# Patient Record
Sex: Female | Born: 1945 | Race: Black or African American | Hispanic: No | Marital: Single | Smoking: Never smoker
Health system: Southern US, Community
[De-identification: ages and names within clinical notes are randomized; demographics above are authoritative.]

## PROBLEM LIST (undated history)

## (undated) DIAGNOSIS — I10 Essential (primary) hypertension: Secondary | ICD-10-CM

## (undated) DIAGNOSIS — I219 Acute myocardial infarction, unspecified: Secondary | ICD-10-CM

## (undated) DIAGNOSIS — S7291XA Unspecified fracture of right femur, initial encounter for closed fracture: Secondary | ICD-10-CM

## (undated) DIAGNOSIS — D518 Other vitamin B12 deficiency anemias: Secondary | ICD-10-CM

## (undated) DIAGNOSIS — M1990 Unspecified osteoarthritis, unspecified site: Secondary | ICD-10-CM

## (undated) DIAGNOSIS — I82419 Acute embolism and thrombosis of unspecified femoral vein: Secondary | ICD-10-CM

## (undated) DIAGNOSIS — I4891 Unspecified atrial fibrillation: Secondary | ICD-10-CM

## (undated) DIAGNOSIS — R570 Cardiogenic shock: Secondary | ICD-10-CM

## (undated) DIAGNOSIS — J690 Pneumonitis due to inhalation of food and vomit: Secondary | ICD-10-CM

## (undated) DIAGNOSIS — I472 Ventricular tachycardia: Secondary | ICD-10-CM

## (undated) DIAGNOSIS — J9690 Respiratory failure, unspecified, unspecified whether with hypoxia or hypercapnia: Secondary | ICD-10-CM

## (undated) HISTORY — PX: ABDOMINAL HYSTERECTOMY: SHX81

---

## 2003-10-31 ENCOUNTER — Ambulatory Visit (HOSPITAL_COMMUNITY): Admission: RE | Admit: 2003-10-31 | Discharge: 2003-10-31 | Payer: Self-pay | Admitting: Internal Medicine

## 2008-10-16 ENCOUNTER — Ambulatory Visit (HOSPITAL_COMMUNITY): Admission: RE | Admit: 2008-10-16 | Discharge: 2008-10-16 | Payer: Self-pay | Admitting: Family Medicine

## 2010-04-08 ENCOUNTER — Ambulatory Visit: Payer: Self-pay | Admitting: Family Medicine

## 2010-05-02 ENCOUNTER — Other Ambulatory Visit: Payer: Self-pay | Admitting: Obstetrics and Gynecology

## 2010-05-02 ENCOUNTER — Encounter (HOSPITAL_COMMUNITY): Payer: Medicare Other | Attending: Obstetrics and Gynecology

## 2010-05-02 DIAGNOSIS — Z01810 Encounter for preprocedural cardiovascular examination: Secondary | ICD-10-CM | POA: Insufficient documentation

## 2010-05-02 DIAGNOSIS — N814 Uterovaginal prolapse, unspecified: Secondary | ICD-10-CM | POA: Insufficient documentation

## 2010-05-02 DIAGNOSIS — Z01812 Encounter for preprocedural laboratory examination: Secondary | ICD-10-CM | POA: Insufficient documentation

## 2010-05-02 DIAGNOSIS — Z01818 Encounter for other preprocedural examination: Secondary | ICD-10-CM | POA: Insufficient documentation

## 2010-05-02 LAB — CBC
MCH: 28.7 pg (ref 26.0–34.0)
Platelets: 344 10*3/uL (ref 150–400)
RBC: 4.56 MIL/uL (ref 3.87–5.11)
RDW: 13.7 % (ref 11.5–15.5)

## 2010-05-02 LAB — COMPREHENSIVE METABOLIC PANEL
AST: 43 U/L — ABNORMAL HIGH (ref 0–37)
Albumin: 3.2 g/dL — ABNORMAL LOW (ref 3.5–5.2)
Calcium: 9.4 mg/dL (ref 8.4–10.5)
Chloride: 96 mEq/L (ref 96–112)
Creatinine, Ser: 0.66 mg/dL (ref 0.4–1.2)
GFR calc Af Amer: >60 mL/min (ref >60)
Total Bilirubin: 0.8 mg/dL (ref 0.3–1.2)
Total Protein: 5.2 g/dL — ABNORMAL LOW (ref 6.0–8.3)

## 2010-05-07 ENCOUNTER — Other Ambulatory Visit: Payer: Self-pay | Admitting: Obstetrics and Gynecology

## 2010-05-07 ENCOUNTER — Other Ambulatory Visit (HOSPITAL_COMMUNITY)
Admission: RE | Admit: 2010-05-07 | Discharge: 2010-05-07 | Disposition: A | Discharge status: Home / Self Care | Payer: Medicare Other | Source: Ambulatory Visit | Attending: Obstetrics and Gynecology | Admitting: Obstetrics and Gynecology

## 2010-05-07 ENCOUNTER — Ambulatory Visit (HOSPITAL_COMMUNITY)
Admission: RE | Admit: 2010-05-07 | Discharge: 2010-05-07 | Disposition: A | Discharge status: Home / Self Care | Payer: Medicare Other | Source: Ambulatory Visit | Attending: Obstetrics and Gynecology | Admitting: Obstetrics and Gynecology

## 2010-05-07 DIAGNOSIS — Z124 Encounter for screening for malignant neoplasm of cervix: Secondary | ICD-10-CM | POA: Insufficient documentation

## 2010-05-07 DIAGNOSIS — Z113 Encounter for screening for infections with a predominantly sexual mode of transmission: Secondary | ICD-10-CM | POA: Insufficient documentation

## 2010-06-11 ENCOUNTER — Other Ambulatory Visit: Payer: Self-pay | Admitting: Obstetrics and Gynecology

## 2010-06-14 NOTE — Op Note (Signed)
[name], [name]              ACCOUNT NO.:  [id number]   MEDICAL RECORD NO.:  [id number]          PATIENT TYPE:  AMB   LOCATION:  DAY                           FACILITY:  [facility]   PHYSICIAN:  R. Roetta Sessions, M.D. DATE OF BIRTH:  02/17/1945   DATE OF PROCEDURE:  10/31/2003  DATE OF DISCHARGE:                                 OPERATIVE REPORT   PROCEDURE:  Screening colonoscopy.   INDICATIONS:  The patient is a 65 year old lady sent over by Dr. Betha Loa down  in [facility] for high risk screening colonoscopy.  She is devoid of any GI  symptoms.  She had a colonoscopy several years ago.  Family history is  significant in that her sister has a history of colorectal cancer.  Colonoscopy is now being done.  This procedure has been discussed with the  patient at length.  The potential risks, benefits and alternatives have been  reviewed and questions answered.   DESCRIPTION OF PROCEDURE:  Oxygen saturation, blood pressure, pulse and  respiration were monitored throughout the entire procedure.  Conscious  sedation with Versed 2 mg IV and Demerol 50 mg IV in divided doses.  The  instrument was the Olympus video chip system.   FINDINGS:  Digital rectal exam no abnormalities.   ENDOSCOPIC FINDINGS:  Prep was good.   Rectum:  Examination of the rectal mucosa including retroflexion in the anal  verge revealed no abnormalities.   Colon:  Colonic mucosa was surveyed from the rectosigmoid junction through  the left, transverse,  right colon to the area of the appendiceal orifice,  ileocecal valve and cecum. These structures were well seen and photographed  for the record.  From this level the scope was slowly withdrawn.  All  previously mentioned mucosal surfaces were again seen.  The patient had a  few tiny sigmoid diverticula and the colonic mucosa appeared normal.  The  patient tolerated the procedure well and was reactive after endoscopy.   IMPRESSION:  1.  Normal rectum.  2.  A few  tiny left-sided diverticula.  3.  The remainder of the colonic mucosa appeared normal.   RECOMMENDATIONS:  Diverticulosis literature provided [name].  She  should return in five years for a repeat high-risk screening colonoscopy.     Otelia Sergeant   RMR/MEDQ  D:  10/31/2003  T:  10/31/2003  Job:  [id number]   cc:   Dr. Osvaldo Human [facility] near [place]

## 2010-07-11 ENCOUNTER — Other Ambulatory Visit: Payer: Self-pay | Admitting: Obstetrics and Gynecology

## 2010-07-11 ENCOUNTER — Other Ambulatory Visit: Payer: Self-pay | Admitting: Infectious Diseases

## 2010-07-11 ENCOUNTER — Encounter (HOSPITAL_COMMUNITY): Payer: Medicare Other

## 2010-07-11 LAB — CBC
HCT: 39.8 % (ref 36.0–46.0)
Hemoglobin: 14 g/dL (ref 12.0–15.0)
MCV: 83.8 fL (ref 78.0–100.0)
RBC: 4.75 MIL/uL (ref 3.87–5.11)
WBC: 6.8 10*3/uL (ref 4.0–10.5)

## 2010-07-11 LAB — BASIC METABOLIC PANEL
BUN: 9 mg/dL (ref 6–23)
CO2: 28 mEq/L (ref 19–32)
Chloride: 97 mEq/L (ref 96–112)
Creatinine, Ser: 0.62 mg/dL (ref 0.4–1.2)
Potassium: 4 mEq/L (ref 3.5–5.1)

## 2010-07-12 NOTE — H&P (Signed)
NAMEISEL, SKUFCA NO.:  [id number]  MEDICAL RECORD NO.:  [id number]  LOCATION:                                 FACILITY:  PHYSICIAN:  Tilda Burrow, M.D. DATE OF BIRTH:  05-26-1945  DATE OF ADMISSION: DATE OF DISCHARGE:  LH                             HISTORY & PHYSICAL   Date of admission July 16, 2010 through [facility]  ADMISSION DIAGNOSES:  Second-degree uterine descensus, cystocele, small rectocele, cervical dysplasia, low-grade mild to moderate dysplasia on biopsy.  HISTORY OF PRESENT ILLNESS:  This 65 year old female, postmenopausal, has been seen in our office for evaluation of symptomatic uterine descensus and cystocele.  She was referred to our office at the courtesy of Dr. Darreld Mclean of [facility], [place], [place].  She has been evaluated with exam revealing a small tiny atrophic uterus with descent to the point that it protrudes from the introitus with prolonged activity or lifting.  She has accompanying cystocele.  The posterior wall is moderately well supported and she has only a small rectocele, was not giving huge problems.  We nonetheless correct as a portion of the procedure.  REVIEW OF SYSTEMS:  Interestingly, the patient has had a retained IUD for 20+ years within the uterus.  Ultrasound has shown that there is no adjacent tissues and the endometrium is thin around the IUD.  Pap smear was performed, which showed cervical dysplasia, suspected low grade. Colposcopy revealed an extensive area of dysplasia on the posterior lip and larger area on the anterior lip of the cervix.  Biopsies were performed which showed mild-to-moderate dysplasia.  No invasive disease is identified.  PAST MEDICAL HISTORY:  Notable for hypertension, hypercholesterolemia, and gastritis.  SURGICAL HISTORY:  Negative.  ALLERGIES:  None.  MEDICATIONS: 1. Atenolol 50 mg twice daily. 2. HCTZ 25  mg p.o. daily. 3. Norvasc 10 mg p.o. daily. 4. Calcium and vitamin D taken daily. 5. Lipitor 20 mg p.o. daily.  PAST SURGICAL HISTORY:  Negative.  FAMILY HISTORY:  Notable that the patient is single, has one child.  She is a nonsmoker, nondrinker, has never done so.  PHYSICAL EXAMINATION:  GENERAL:  Shows a thin, petite female, weight 108.8, blood pressure 120/80, pulse 70s.  Urinalysis negative. HEENT:  Normocephalic, atraumatic.  Pupils are equal and reactive. NECK:  Supple.  Normal thyroid. CHEST:  Clear to auscultation. HEART:  Shows a regular rate and rhythm without murmurs. ABDOMEN:  Soft without masses. EXTERNAL GENITALIA:  Shows a normal perineal support, but obvious cervix and bladder protruding with Valsalva maneuver.  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 Adnexa was without masses.  Rectal support shows a small posterior rectocele beneath a well-supported anal sphincter with good muscle tone. EXTREMITIES:  Without cyanosis, clubbing, or edema.  The patient rolls her stockings and has been advised to discontinue this technique. Peripheral pulses are good.  IMPRESSION:  Secondary uterine prolapse, cystocele, small rectocele, and also other diagnoses include continuing the diagnoses cervical dysplasia, hypertension, hypercholesterolemia.  PLAN:  Vaginal hysterectomy with removal of tubes and ovaries, anterior and posterior repair, July 16, 2010.  Tilda Burrow, M.D.     JVF/MEDQ  D:  07/11/2010  T:  07/12/2010  Job:  [id number]  cc:   [facility]  Darreld Mclean, MD [facility] [address] [place], [place] [address] Fax:  [phone]  Electronically Signed by Christin Bach M.D. on 07/12/2010 07:37:41 PM

## 2010-07-16 ENCOUNTER — Ambulatory Visit (HOSPITAL_COMMUNITY)
Admission: RE | Admit: 2010-07-16 | Discharge: 2010-07-17 | Disposition: A | Discharge status: Home / Self Care | Payer: Medicare Other | Source: Ambulatory Visit | Attending: Obstetrics and Gynecology | Admitting: Obstetrics and Gynecology

## 2010-07-16 ENCOUNTER — Other Ambulatory Visit: Payer: Self-pay | Admitting: Obstetrics and Gynecology

## 2010-07-16 DIAGNOSIS — N870 Mild cervical dysplasia: Secondary | ICD-10-CM | POA: Insufficient documentation

## 2010-07-16 DIAGNOSIS — Z01812 Encounter for preprocedural laboratory examination: Secondary | ICD-10-CM | POA: Insufficient documentation

## 2010-07-16 DIAGNOSIS — N814 Uterovaginal prolapse, unspecified: Secondary | ICD-10-CM | POA: Insufficient documentation

## 2010-07-16 DIAGNOSIS — I10 Essential (primary) hypertension: Secondary | ICD-10-CM | POA: Insufficient documentation

## 2010-07-16 DIAGNOSIS — E7800 Pure hypercholesterolemia, unspecified: Secondary | ICD-10-CM | POA: Insufficient documentation

## 2010-07-16 DIAGNOSIS — Z23 Encounter for immunization: Secondary | ICD-10-CM | POA: Insufficient documentation

## 2010-07-16 DIAGNOSIS — Z79899 Other long term (current) drug therapy: Secondary | ICD-10-CM | POA: Insufficient documentation

## 2010-07-17 LAB — BASIC METABOLIC PANEL
BUN: 6 mg/dL (ref 6–23)
CO2: 28 mEq/L (ref 19–32)
GFR calc non Af Amer: >60 mL/min (ref >60)
Glucose, Bld: 121 mg/dL — ABNORMAL HIGH (ref 70–99)
Potassium: 3.4 mEq/L — ABNORMAL LOW (ref 3.5–5.1)
Sodium: 133 mEq/L — ABNORMAL LOW (ref 135–145)

## 2010-07-17 LAB — CBC
HCT: 34.6 % — ABNORMAL LOW (ref 36.0–46.0)
Hemoglobin: 11.9 g/dL — ABNORMAL LOW (ref 12.0–15.0)
MCH: 29 pg (ref 26.0–34.0)
MCHC: 34.4 g/dL (ref 30.0–36.0)
RBC: 4.11 MIL/uL (ref 3.87–5.11)

## 2010-07-17 LAB — DIFFERENTIAL
Basophils Absolute: 0 10*3/uL (ref 0.0–0.1)
Lymphocytes Relative: 17 % (ref 12–46)
Monocytes Absolute: 1 10*3/uL (ref 0.1–1.0)
Monocytes Relative: 11 % (ref 3–12)
Neutro Abs: 6.3 10*3/uL (ref 1.7–7.7)
Neutrophils Relative %: 71 % (ref 43–77)

## 2010-07-28 NOTE — Op Note (Signed)
[name], [name] NO.:  [id number]  MEDICAL RECORD NO.:  [id number]  LOCATION:  A316                          FACILITY:  [facility]  PHYSICIAN:  Tilda Burrow, M.D. DATE OF BIRTH:  Jul 23, 1945  DATE OF PROCEDURE:  07/16/2010 DATE OF DISCHARGE:                              OPERATIVE REPORT   PREOPERATIVE DIAGNOSES: 1. Cystocele. 2. Rectocele. 3. Uterine descensus second-degree. 4. Cervical dysplasia, grade 1.  POSTOPERATIVE DIAGNOSES: 1. Cystocele. 2. Rectocele. 3. Uterine descensus second-degree. 4. Cervical dysplasia, grade 1.  PROCEDURES: 1. Vaginal hysterectomy. 2. Bilateral salpingo-oophorectomy. 3. Anterior and posterior repair.  SURGEON:  Tilda Burrow, MD  ASSISTANT:  Earl Many, RN  ANESTHESIA:  Spinal.  COMPLICATIONS:  None.  FINDINGS:  Small uterus with elongated cervix protruding past the introitus with Valsalva.  Small cystocele.  PROCEDURE DETAILS:  The patient was taken to the operating room, prepped and draped for vaginal procedure after spinal anesthesia introduced. Time-out was conducted and procedure confirmed by involved parties and the weighted short 3-inch speculum placed in the vagina.  The posterior colpotomy incision was performed identifying the posterior cul-de-sac. The anterior cervix was circumscribed with Bovie and then with the surgeon's index finger placed behind the tiny uterus we were able to identify the vesicouterine reflection of peritoneum and sharply dissected the vesicouterine space and into the peritoneum anteriorly as well.  The uterosacral ligaments were clamped, cut, and suture ligated on either side and tagged for future identification.  Lower cardinal ligament was clamped, cut, and ligated on each side using the Zeppelin clamp and 0-chromic suture.  The upper cardinal ligament and broad ligament were then serially clamped, cut, and suture ligated using small bites of the Zeppelin clamp, Mayo  scissors transection and 0-chromic suture ligature with good hemostasis.  Particular care was taken to stay very close to the uterus and lower uterine segment during this portion of the case.  Upon reaching the level of the fallopian tubes, the pedicle was crossclamped including the utero-ovarian ligament and fallopian tube all- in-one body.  These were transected and ligated and tagged for future identification.  The uterus was removed.  On palpation I could identify that the IUD was still inside the uterus.  The fallopian tube and ovary were then identified and while the left tube was placed on traction, we were able to identify the infundibulopelvic ligaments and while staying close to the tube and ovary the pedicle could be crossclamped and doubly ligated.  The patient experienced some bradycardia during this portion of the case but responded upon cessation of traction on the left tube.  The right side was treated similarly and tolerated well by the patient.  Specimens were and taken passed off and pedicles were intact.  They were hemostatic as well.  The patient then had 0-Prolene attachment of the posterior cul-de-sac to the medial aspects of the uterosacral ligament on each side which was sutured in order to improve vaginal cuff support.  The anterior peritoneum was then attached to the posterior cul-de-sac just behind the Prolene sutures previously placed.  Foley catheter was inserted draining 200 mL of clear urine.  Anterior repair followed.  The anterior repair consisted  of splitting the vaginal epithelium beneath the upper aspects of the bladder, peeling the epithelium off laterally and trimmings the redundant tissue just anterior to the cervix.  Given the previous wide area of cervical dysplasia, this was considered burden.  The remaining anterior epithelium was split in the midline, dissected free from underlying cystocele, and a series of horizontal mattress sutures  placed on the underside of the epithelium to improve the anterior support.  The remaining cuff edges were attached to the vaginal cuff, closing the cuff and providing improved anterior support.  Hemostasis was good.  Posterior repair followed.  This consisted of grabbing the posterior perineal body at 5 and 7 o'clock, splitting the epithelium transversely and dissecting into the epithelium with a distance of about 6 cm and dissecting laterally.  Marcaine 20 mL with epinephrine had been placed in this area prior to this dissection.  We were able to identify some good strong supportive tissues from the levator plate on either side and this could be pulled into the midline with a series of horizontal mattress sutures which resulted in improved posterior perineal support and good vaginal diameters left.  The posterior perineal body was pulled into closer approximation by a series of horizontal mattress sutures.  Vaginal epithelium trimmed and then the posterior perineum pulled back together in the longitudinal axis.  The perineal body support was dramatically improved.  Vaginal packing was dry.  Betadine- soaked vaginal gauze was placed.  Sponge and needle counts were correct. The patient allowed to go to recovery room in good condition.     Tilda Burrow, M.D.     JVF/MEDQ  D:  07/16/2010  T:  07/17/2010  Job:  [id number]  cc:   [facility]  Electronically Signed by Christin Bach M.D. on 07/28/2010 10:26:03 PM

## 2010-12-24 ENCOUNTER — Encounter: Payer: Self-pay | Admitting: Emergency Medicine

## 2010-12-24 ENCOUNTER — Inpatient Hospital Stay (HOSPITAL_COMMUNITY)
Admission: EM | Admit: 2010-12-24 | Discharge: 2010-12-30 | DRG: 300 | Disposition: A | Discharge status: Skilled Nursing Facility | Payer: Medicare Other | Attending: Internal Medicine | Admitting: Internal Medicine

## 2010-12-24 DIAGNOSIS — E876 Hypokalemia: Secondary | ICD-10-CM | POA: Diagnosis present

## 2010-12-24 DIAGNOSIS — R52 Pain, unspecified: Secondary | ICD-10-CM | POA: Diagnosis present

## 2010-12-24 DIAGNOSIS — I824Y9 Acute embolism and thrombosis of unspecified deep veins of unspecified proximal lower extremity: Principal | ICD-10-CM | POA: Diagnosis present

## 2010-12-24 DIAGNOSIS — D518 Other vitamin B12 deficiency anemias: Secondary | ICD-10-CM

## 2010-12-24 DIAGNOSIS — I10 Essential (primary) hypertension: Secondary | ICD-10-CM

## 2010-12-24 DIAGNOSIS — D649 Anemia, unspecified: Secondary | ICD-10-CM

## 2010-12-24 DIAGNOSIS — R760 Raised antibody titer: Secondary | ICD-10-CM

## 2010-12-24 DIAGNOSIS — R894 Abnormal immunological findings in specimens from other organs, systems and tissues: Secondary | ICD-10-CM | POA: Diagnosis present

## 2010-12-24 DIAGNOSIS — I82419 Acute embolism and thrombosis of unspecified femoral vein: Secondary | ICD-10-CM | POA: Diagnosis present

## 2010-12-24 DIAGNOSIS — E871 Hypo-osmolality and hyponatremia: Secondary | ICD-10-CM

## 2010-12-24 DIAGNOSIS — D509 Iron deficiency anemia, unspecified: Secondary | ICD-10-CM | POA: Diagnosis present

## 2010-12-24 DIAGNOSIS — R410 Disorientation, unspecified: Secondary | ICD-10-CM | POA: Diagnosis not present

## 2010-12-24 DIAGNOSIS — E538 Deficiency of other specified B group vitamins: Secondary | ICD-10-CM | POA: Diagnosis present

## 2010-12-24 DIAGNOSIS — M7989 Other specified soft tissue disorders: Secondary | ICD-10-CM

## 2010-12-24 HISTORY — DX: Acute embolism and thrombosis of unspecified femoral vein: I82.419

## 2010-12-24 HISTORY — DX: Other vitamin B12 deficiency anemias: D51.8

## 2010-12-24 HISTORY — DX: Essential (primary) hypertension: I10

## 2010-12-24 LAB — COMPREHENSIVE METABOLIC PANEL
Albumin: 3.1 g/dL — ABNORMAL LOW (ref 3.5–5.2)
Alkaline Phosphatase: 52 U/L (ref 39–117)
BUN: 8 mg/dL (ref 6–23)
Calcium: 9.2 mg/dL (ref 8.4–10.5)
Creatinine, Ser: 0.55 mg/dL (ref 0.50–1.10)
GFR calc Af Amer: >90 mL/min (ref >90)
Glucose, Bld: 120 mg/dL — ABNORMAL HIGH (ref 70–99)
Potassium: 3.1 mEq/L — ABNORMAL LOW (ref 3.5–5.1)
Total Protein: 7.1 g/dL (ref 6.0–8.3)

## 2010-12-24 LAB — CBC
Hemoglobin: 12.5 g/dL (ref 12.0–15.0)
MCH: 27.7 pg (ref 26.0–34.0)
MCHC: 34.1 g/dL (ref 30.0–36.0)
MCV: 81.4 fL (ref 78.0–100.0)
RBC: 4.51 MIL/uL (ref 3.87–5.11)

## 2010-12-24 LAB — D-DIMER, QUANTITATIVE: D-Dimer, Quant: 3.16 ug/mL-FEU — ABNORMAL HIGH (ref 0.00–0.48)

## 2010-12-24 LAB — DIFFERENTIAL
Basophils Relative: 0 % (ref 0–1)
Eosinophils Absolute: 0.1 10*3/uL (ref 0.0–0.7)
Eosinophils Relative: 1 % (ref 0–5)
Lymphs Abs: 1 10*3/uL (ref 0.7–4.0)
Monocytes Absolute: 0.6 10*3/uL (ref 0.1–1.0)
Monocytes Relative: 6 % (ref 3–12)
Neutrophils Relative %: 82 % — ABNORMAL HIGH (ref 43–77)

## 2010-12-24 LAB — PROTIME-INR
INR: 1.03 (ref 0.00–1.49)
Prothrombin Time: 13.7 seconds (ref 11.6–15.2)

## 2010-12-24 LAB — APTT: aPTT: 31 seconds (ref 24–37)

## 2010-12-24 MED ORDER — SODIUM CHLORIDE 0.9 % IJ SOLN
3.0000 mL | Freq: Two times a day (BID) | INTRAMUSCULAR | Status: DC
  Administered 2010-12-25 – 2010-12-30 (×11): 3 mL via INTRAVENOUS
  Filled 2010-12-24 (×12): qty 3

## 2010-12-24 MED ORDER — ENOXAPARIN SODIUM 80 MG/0.8ML ~~LOC~~ SOLN
45.0000 mg | Freq: Once | SUBCUTANEOUS | Status: AC
  Administered 2010-12-24: 45 mg via SUBCUTANEOUS
  Filled 2010-12-24: qty 0.8

## 2010-12-24 MED ORDER — SENNA 8.6 MG PO TABS: 2.0000 | ORAL_TABLET | Freq: Every day | ORAL | Status: DC | PRN

## 2010-12-24 MED ORDER — ENOXAPARIN SODIUM 30 MG/0.3ML ~~LOC~~ SOLN: 1.0000 mg/kg | Freq: Two times a day (BID) | SUBCUTANEOUS | Status: DC

## 2010-12-24 MED ORDER — ONDANSETRON HCL 4 MG/2ML IJ SOLN
4.0000 mg | Freq: Once | INTRAMUSCULAR | Status: AC
  Administered 2010-12-24: 4 mg via INTRAVENOUS
  Filled 2010-12-24: qty 2

## 2010-12-24 MED ORDER — POTASSIUM CHLORIDE CRYS ER 20 MEQ PO TBCR
20.0000 meq | EXTENDED_RELEASE_TABLET | Freq: Once | ORAL | Status: AC
  Administered 2010-12-25: 20 meq via ORAL
  Filled 2010-12-24: qty 1

## 2010-12-24 MED ORDER — HYDROCHLOROTHIAZIDE 25 MG PO TABS
25.0000 mg | ORAL_TABLET | ORAL | Status: DC
  Administered 2010-12-25 – 2010-12-26 (×2): 25 mg via ORAL
  Filled 2010-12-24 (×2): qty 1

## 2010-12-24 MED ORDER — ATENOLOL 25 MG PO TABS
50.0000 mg | ORAL_TABLET | Freq: Two times a day (BID) | ORAL | Status: DC
  Administered 2010-12-25 – 2010-12-26 (×4): 50 mg via ORAL
  Filled 2010-12-24 (×4): qty 2

## 2010-12-24 MED ORDER — HYDROMORPHONE HCL PF 1 MG/ML IJ SOLN
1.0000 mg | Freq: Once | INTRAMUSCULAR | Status: AC
  Administered 2010-12-24: 1 mg via INTRAVENOUS
  Filled 2010-12-24: qty 1

## 2010-12-24 MED ORDER — DOCUSATE SODIUM 100 MG PO CAPS
100.0000 mg | ORAL_CAPSULE | Freq: Two times a day (BID) | ORAL | Status: DC
  Administered 2010-12-25 – 2010-12-30 (×12): 100 mg via ORAL
  Filled 2010-12-24 (×12): qty 1

## 2010-12-24 MED ORDER — CALCIUM CARBONATE-VITAMIN D 500-200 MG-UNIT PO TABS
1.0000 | ORAL_TABLET | Freq: Two times a day (BID) | ORAL | Status: DC
  Administered 2010-12-25 – 2010-12-30 (×11): 1 via ORAL
  Filled 2010-12-24 (×11): qty 1

## 2010-12-24 MED ORDER — ONDANSETRON HCL 4 MG PO TABS: 4.0000 mg | ORAL_TABLET | Freq: Four times a day (QID) | ORAL | Status: DC | PRN

## 2010-12-24 MED ORDER — ACETAMINOPHEN 650 MG RE SUPP: 650.0000 mg | Freq: Four times a day (QID) | RECTAL | Status: DC | PRN

## 2010-12-24 MED ORDER — ALBUTEROL SULFATE (5 MG/ML) 0.5% IN NEBU: 2.5000 mg | INHALATION_SOLUTION | RESPIRATORY_TRACT | Status: DC | PRN

## 2010-12-24 MED ORDER — MORPHINE SULFATE 4 MG/ML IJ SOLN
2.0000 mg | INTRAMUSCULAR | Status: DC | PRN
  Administered 2010-12-25: 2 mg via INTRAVENOUS
  Filled 2010-12-24: qty 1

## 2010-12-24 MED ORDER — POTASSIUM CHLORIDE CRYS ER 20 MEQ PO TBCR
40.0000 meq | EXTENDED_RELEASE_TABLET | Freq: Once | ORAL | Status: AC
  Administered 2010-12-24: 40 meq via ORAL
  Filled 2010-12-24: qty 2

## 2010-12-24 MED ORDER — ONDANSETRON HCL 4 MG/2ML IJ SOLN: 4.0000 mg | Freq: Four times a day (QID) | INTRAMUSCULAR | Status: DC | PRN

## 2010-12-24 MED ORDER — OXYCODONE HCL 5 MG PO TABS
5.0000 mg | ORAL_TABLET | ORAL | Status: DC | PRN
  Administered 2010-12-25 – 2010-12-30 (×14): 5 mg via ORAL
  Filled 2010-12-24 (×14): qty 1

## 2010-12-24 MED ORDER — TRAZODONE HCL 50 MG PO TABS: 50.0000 mg | ORAL_TABLET | Freq: Every evening | ORAL | Status: DC | PRN

## 2010-12-24 MED ORDER — ACETAMINOPHEN 325 MG PO TABS
650.0000 mg | ORAL_TABLET | Freq: Four times a day (QID) | ORAL | Status: DC | PRN
  Administered 2010-12-27 – 2010-12-29 (×3): 650 mg via ORAL
  Filled 2010-12-24 (×3): qty 2

## 2010-12-24 NOTE — H&P (Signed)
[name] is an 65 y.o. female.    PCP: Leanna Sato, MD This physician is located in [place], [place].  Chief Complaint: Pain and swelling of the right leg  HPI: This is a 65 year old, African American female, with a past history of hypertension. She underwent abdominal hysterectomy in June of this year by Dr. Emelda Fear. She was in her usual state of health till a few weeks ago, when she started having pain in the right leg. She did not seek any attention. The pain got severe as the days went by. And, then on Sunday she started noticing swelling in that leg. The pain got very severe over the last couple days and, so, she decided to come in to the hospital. Denies any fever or chills. Denies any chest pain or shortness of breath. Denies any dizziness or lightheadedness. Denies any nausea, vomiting. Denies any dysuria. Denies any abdominal pain. Never had blood clots in the past.   Prior to Admission medications   Medication Sig Start Date End Date Taking? Authorizing Provider  atenolol (TENORMIN) 50 MG tablet Take 50 mg by mouth 2 (two) times daily.     Yes Historical Provider, MD  Calcium Carbonate-Vitamin D ([employer] CALCIUM 600 + D) 600-400 MG-UNIT per tablet Take 1 tablet by mouth 2 (two) times daily.     Yes Historical Provider, MD  hydrochlorothiazide (HYDRODIURIL) 25 MG tablet Take 25 mg by mouth every morning.     Yes Historical Provider, MD  traZODone (DESYREL) 50 MG tablet Take 50 mg by mouth at bedtime.     Yes Historical Provider, MD    Allergies: No Known Allergies  Past Medical History  Diagnosis Date  . Hypertension     Past Surgical History  Procedure Date  . Abdominal hysterectomy     06 /2012    Social History:  reports that she has never smoked. She has never used smokeless tobacco. She reports that she does not drink alcohol or use illicit drugs.  Family History: Patient says that there is no medical problems in the family.  Review of Systems - History  obtained from the patient and daughter General ROS: negative Psychological ROS: negative Ophthalmic ROS: negative ENT ROS: negative Allergy and Immunology ROS: negative Hematological and Lymphatic ROS: negative Endocrine ROS: negative Respiratory ROS: no cough, shortness of breath, or wheezing Cardiovascular ROS: no chest pain or dyspnea on exertion Gastrointestinal ROS: no abdominal pain, change in bowel habits, or black or bloody stools Genito-Urinary ROS: no dysuria, trouble voiding, or hematuria Musculoskeletal ROS: positive for - pain in leg - right Neurological ROS: negative Dermatological ROS: negative  Physical Examination Blood pressure 126/78, pulse 100, temperature 98.2 F (36.8 C), temperature source Oral, resp. rate 16, height 5' (1.524 m), weight 45.36 kg (100 lb), SpO2 98.00%.  General appearance: alert, cooperative, appears stated age and no distress Head: Normocephalic, without obvious abnormality, atraumatic Eyes: conjunctivae/corneas clear. PERRL, EOM's intact. Fundi benign. Throat: lips, mucosa, and tongue normal; teeth and gums normal Neck: no adenopathy, no carotid bruit, no JVD, supple, symmetrical, trachea midline and thyroid not enlarged, symmetric, no tenderness/mass/nodules Resp: clear to auscultation bilaterally Cardio: regular rate and rhythm, S1, S2 normal, no murmur, click, rub or gallop GI: soft, non-tender; bowel sounds normal; no masses,  no organomegaly Extremities: Swelling and tenderness right thigh and leg. calf tenderness is present. Pulses: 2+ and symmetric Skin: mild redness of right leg Lymph nodes: Cervical, supraclavicular, and axillary nodes normal. Neurologic: Alert and oriented X 3, normal  strength and tone. Normal symmetric reflexes. Normal coordination and gait  Results for orders placed during the hospital encounter of 12/24/10 (from the past 48 hour(s))  CBC     Status: Normal   Collection Time   12/24/10  6:05 PM      Component  Value Range Comment   WBC 9.3  4.0 - 10.5 (K/uL)    RBC 4.51  3.87 - 5.11 (MIL/uL)    Hemoglobin 12.5  12.0 - 15.0 (g/dL)    HCT 16.1  09.6 - 04.5 (%)    MCV 81.4  78.0 - 100.0 (fL)    MCH 27.7  26.0 - 34.0 (pg)    MCHC 34.1  30.0 - 36.0 (g/dL)    RDW 40.9  81.1 - 91.4 (%)    Platelets 227  150 - 400 (K/uL)   DIFFERENTIAL     Status: Abnormal   Collection Time   12/24/10  6:05 PM      Component Value Range Comment   Neutrophils Relative 82 (*) 43 - 77 (%)    Neutro Abs 7.6  1.7 - 7.7 (K/uL)    Lymphocytes Relative 11 (*) 12 - 46 (%)    Lymphs Abs 1.0  0.7 - 4.0 (K/uL)    Monocytes Relative 6  3 - 12 (%)    Monocytes Absolute 0.6  0.1 - 1.0 (K/uL)    Eosinophils Relative 1  0 - 5 (%)    Eosinophils Absolute 0.1  0.0 - 0.7 (K/uL)    Basophils Relative 0  0 - 1 (%)    Basophils Absolute 0.0  0.0 - 0.1 (K/uL)   COMPREHENSIVE METABOLIC PANEL     Status: Abnormal   Collection Time   12/24/10  6:05 PM      Component Value Range Comment   Sodium 139  135 - 145 (mEq/L)    Potassium 3.1 (*) 3.5 - 5.1 (mEq/L)    Chloride 100  96 - 112 (mEq/L)    CO2 26  19 - 32 (mEq/L)    Glucose, Bld 120 (*) 70 - 99 (mg/dL)    BUN 8  6 - 23 (mg/dL)    Creatinine, Ser 7.82  0.50 - 1.10 (mg/dL)    Calcium 9.2  8.4 - 10.5 (mg/dL)    Total Protein 7.1  6.0 - 8.3 (g/dL)    Albumin 3.1 (*) 3.5 - 5.2 (g/dL)    AST 37  0 - 37 (U/L)    ALT 15  0 - 35 (U/L)    Alkaline Phosphatase 52  39 - 117 (U/L)    Total Bilirubin 0.2 (*) 0.3 - 1.2 (mg/dL)    GFR calc non Af Amer >90  >90 (mL/min)    GFR calc Af Amer >90  >90 (mL/min)   PROTIME-INR     Status: Normal   Collection Time   12/24/10  6:05 PM      Component Value Range Comment   Prothrombin Time 13.7  11.6 - 15.2 (seconds)    INR 1.03  0.00 - 1.49    APTT     Status: Normal   Collection Time   12/24/10  6:05 PM      Component Value Range Comment   aPTT 31  24 - 37 (seconds)   D-DIMER, QUANTITATIVE     Status: Abnormal   Collection Time   12/24/10   6:05 PM      Component Value Range Comment   D-Dimer, Quant 3.16 (*) 0.00 -  0.48 (ug/mL-FEU)    No results found.   Assessment/Plan  Principal Problem:  *Leg swelling Active Problems:  HTN (hypertension)  Pain  Hypokalemia   #1 right leg swelling: This is most likely a deep venous thrombosis. She'll be given Lovenox. A venous Doppler will be obtained in the morning. Patient has significant pain and is unable to walk and since she lives alone, patient could not be discharged home. Depending on the diagnosis further evaluation may be required.  #2 history of hypertension: Continue with her anti hypertensive agents.  #3 hypokalemia: Replete her potassium.  Patient is a full code.  Further management decisions will depend on results of further testing and patient's response to treatment  [name] 12/24/2010, 10:58 PM

## 2010-12-24 NOTE — ED Provider Notes (Signed)
History  This chart was scribed for [employer]. Colon Branch, MD by Clarita Crane. The patient was seen in room APA11/APA11 and the patient's care was started at 5:10pm.  CSN: [id number] Arrival date & time: 12/24/2010  4:52 PM  Chief Complaint  Patient presents with  . Leg Pain  . Leg Swelling    First MD Initiated Contact with Patient 12/24/10 1654    (Consider location/radiation/quality/duration/timing/severity/associated sxs/prior treatment) HPI [name] is a 65 y.o. female who presents to the Emergency Department complaining of constant moderate to severe right leg pain. Pt states leg pain began 3 days ago when swelling was initially noted. Pt reports she continued to work with little pain to her right leg. Today, pt noted increased swelling in right thigh from the groin to knee and noted severe constant pain. Pt rates pain 8/10 currently. Pt reports she was unable to move today when going up the stairs because her leg "gave out," which is when she called EMS. Pt denies associated shortness of breath, chills, syncope, cough, hemoptysis, fall and fever. Pt denies hx of the same.    PCP Marvis Moeller MD in [place] Past Medical History  Diagnosis Date  . Hypertension     Past Surgical History  Procedure Date  . Abdominal hysterectomy     06/2010    History reviewed. No pertinent family history.  History  Substance Use Topics  . Smoking status: Never Smoker   . Smokeless tobacco: Never Used  . Alcohol Use: No    OB History    Grav Para Term Preterm Abortions TAB SAB Ect Mult Living   1 1 1       1       Review of Systems  Constitutional: Negative for fever and chills.  Respiratory: Negative for cough and shortness of breath.        Pt denies hemoptysis.  Neurological: Negative for syncope.       Pt denies fall.   All other systems reviewed and are negative.    Allergies  Review of patient's allergies indicates no known allergies.  Home Medications   Current  Outpatient Rx  Name Route Sig Dispense Refill  . ATENOLOL 50 MG PO TABS Oral Take 50 mg by mouth 2 (two) times daily.      Marland Kitchen CALCIUM CARBONATE-VITAMIN D 600-400 MG-UNIT PO TABS Oral Take 1 tablet by mouth 2 (two) times daily.      Marland Kitchen HYDROCHLOROTHIAZIDE 25 MG PO TABS Oral Take 25 mg by mouth every morning.      . TRAZODONE HCL 50 MG PO TABS Oral Take 50 mg by mouth at bedtime.        BP 145/82  Pulse 112  Temp(Src) 98.2 F (36.8 C) (Oral)  Resp 16  Ht 5' (1.524 m)  Wt 100 lb (45.36 kg)  BMI 19.53 kg/m2  SpO2 98%  Physical Exam  Constitutional: She is oriented to person, place, and time. She appears well-developed and well-nourished. No distress.  HENT:  Head: Normocephalic and atraumatic.  Eyes: Conjunctivae and EOM are normal. Pupils are equal, round, and reactive to light.  Neck: Normal range of motion. Neck supple.  Cardiovascular: Normal rate, regular rhythm and normal heart sounds.  Exam reveals no gallop and no friction rub.   No murmur heard. Pulmonary/Chest: Effort normal and breath sounds normal. No respiratory distress. She has no wheezes. She has no rales.  Abdominal: Soft. Bowel sounds are normal. There is no tenderness.  Hips are nl and pelvis is non-tender with applied pressure  Musculoskeletal: She exhibits edema.       Markedly swollen right thigh noted from groin to right knee. Popliteal pulse noted in right leg. Bilateral pulses noted (Dorsalis Pedis and Posterior Tibialis pulse).  Right thigh 22''  Right knee 16''  Lt thigh 18"  Lt knee 12"  Neurological: She is alert and oriented to person, place, and time.  Skin: Skin is warm and dry. No erythema.       Right thigh hot to the touch in comparison to left leg. No evidence of cellulitis.  Psychiatric: She has a normal mood and affect. Her behavior is normal.    ED Course  Procedures (including critical care time)  DIAGNOSTIC STUDIES: Oxygen Saturation is 98% on room air, normal by my  interpretation.    COORDINATION OF CARE:  6:21 PM: Reassessed pt. She denies nausea at this time and diarrhea episodes. Pt states pain has improved, but still experiencing some. In addition, notified pt of benign imaging results. Provided pt with some education of abdominal pain. Noted pt is still tachycardia at this time. Pt informed of intent to continue infuse IV fluids.  Results for orders placed during the hospital encounter of 12/24/10  CBC      Component Value Range   WBC 9.3  4.0 - 10.5 (K/uL)   RBC 4.51  3.87 - 5.11 (MIL/uL)   Hemoglobin 12.5  12.0 - 15.0 (g/dL)   HCT 19.1  47.8 - 29.5 (%)   MCV 81.4  78.0 - 100.0 (fL)   MCH 27.7  26.0 - 34.0 (pg)   MCHC 34.1  30.0 - 36.0 (g/dL)   RDW 62.1  30.8 - 65.7 (%)   Platelets 227  150 - 400 (K/uL)  DIFFERENTIAL      Component Value Range   Neutrophils Relative 82 (*) 43 - 77 (%)   Neutro Abs 7.6  1.7 - 7.7 (K/uL)   Lymphocytes Relative 11 (*) 12 - 46 (%)   Lymphs Abs 1.0  0.7 - 4.0 (K/uL)   Monocytes Relative 6  3 - 12 (%)   Monocytes Absolute 0.6  0.1 - 1.0 (K/uL)   Eosinophils Relative 1  0 - 5 (%)   Eosinophils Absolute 0.1  0.0 - 0.7 (K/uL)   Basophils Relative 0  0 - 1 (%)   Basophils Absolute 0.0  0.0 - 0.1 (K/uL)  COMPREHENSIVE METABOLIC PANEL      Component Value Range   Sodium 139  135 - 145 (mEq/L)   Potassium 3.1 (*) 3.5 - 5.1 (mEq/L)   Chloride 100  96 - 112 (mEq/L)   CO2 26  19 - 32 (mEq/L)   Glucose, Bld 120 (*) 70 - 99 (mg/dL)   BUN 8  6 - 23 (mg/dL)   Creatinine, Ser 8.46  0.50 - 1.10 (mg/dL)   Calcium 9.2  8.4 - 96.2 (mg/dL)   Total Protein 7.1  6.0 - 8.3 (g/dL)   Albumin 3.1 (*) 3.5 - 5.2 (g/dL)   AST 37  0 - 37 (U/L)   ALT 15  0 - 35 (U/L)   Alkaline Phosphatase 52  39 - 117 (U/L)   Total Bilirubin 0.2 (*) 0.3 - 1.2 (mg/dL)   GFR calc non Af Amer >90  >90 (mL/min)   GFR calc Af Amer >90  >90 (mL/min)  PROTIME-INR      Component Value Range   Prothrombin Time 13.7  11.6 - 15.2 (  seconds)   INR 1.03   0.00 - 1.49   APTT      Component Value Range   aPTT 31  24 - 37 (seconds)  D-DIMER, QUANTITATIVE      Component Value Range   D-Dimer, Quant 3.16 (*) 0.00 - 0.48 (ug/mL-FEU)   No results found.   No diagnosis found.  2130 Pateint with continued pain to the right leg. 2140 Spoke with Dr. Rito Ehrlich who will admit patient to telemetry.  MDM  Patient with onset of right leg pain associated with swelling from the knee to the groin. Definite increase in size and girth of right upper leg. Concern for DVT.  Unable to obtain US tonight Labs with positive d-dimer, slightly low potassium, otherwise unremarkable.  IVFs, analgesics, lovenox initiated int he ER. Admission forpain management and further evaluation.Pt stable in ED with no significant deterioration in condition.The patient appears reasonably stabilized for admission considering the current resources, flow, and capabilities available in the ED at this time, and I doubt any other [facility] requiring further screening and/or treatment in the ED prior to admission.  CRITICAL CARE Performed by: Annamarie Dawley.   Total critical care time: 40  Critical care time was exclusive of separately billable procedures and treating other patients.  Critical care was necessary to treat or prevent imminent or life-threatening deterioration.  Critical care was time spent personally by me on the following activities: development of treatment plan with patient and/or surrogate as well as nursing, discussions with consultants, evaluation of patient's response to treatment, examination of patient, obtaining history from patient or surrogate, ordering and performing treatments and interventions, ordering and review of laboratory studies, ordering and review of radiographic studies, pulse oximetry and re-evaluation of patient's condition.  I personally performed the services described in this documentation, which was scribed in my presence. The recorded information  has been reviewed and considered.         Nicoletta Dress. Colon Branch, MD 12/24/10 2144

## 2010-12-24 NOTE — ED Notes (Signed)
Pt has swelling to entire right leg, states that it started swelling on Sunday, pt denies any injury, did fall today due to the pain in right leg, only c/o from fall today is continued pain in right leg, cms intact distal,

## 2010-12-24 NOTE — ED Notes (Signed)
Rt thigh 22 inches rt knee 16 inches/ lt thigh 18 inches lt knee 12 inches. Bud Kaeser

## 2010-12-24 NOTE — ED Notes (Signed)
Patient c/o pain and swelling in right leg. Notable swelling in thigh. Patient reports falling today due to pain.

## 2010-12-25 ENCOUNTER — Encounter (HOSPITAL_COMMUNITY): Payer: Self-pay | Admitting: Internal Medicine

## 2010-12-25 ENCOUNTER — Inpatient Hospital Stay (HOSPITAL_COMMUNITY): Payer: Medicare Other

## 2010-12-25 DIAGNOSIS — D649 Anemia, unspecified: Secondary | ICD-10-CM | POA: Diagnosis not present

## 2010-12-25 DIAGNOSIS — I82419 Acute embolism and thrombosis of unspecified femoral vein: Secondary | ICD-10-CM | POA: Diagnosis present

## 2010-12-25 HISTORY — DX: Acute embolism and thrombosis of unspecified femoral vein: I82.419

## 2010-12-25 LAB — COMPREHENSIVE METABOLIC PANEL
BUN: 8 mg/dL (ref 6–23)
Calcium: 9.3 mg/dL (ref 8.4–10.5)
GFR calc Af Amer: >90 mL/min (ref >90)
Glucose, Bld: 112 mg/dL — ABNORMAL HIGH (ref 70–99)
Total Protein: 6.9 g/dL (ref 6.0–8.3)

## 2010-12-25 LAB — CBC
HCT: 35.2 % — ABNORMAL LOW (ref 36.0–46.0)
Hemoglobin: 11.8 g/dL — ABNORMAL LOW (ref 12.0–15.0)
RBC: 4.27 MIL/uL (ref 3.87–5.11)
RDW: 15.2 % (ref 11.5–15.5)
WBC: 6.4 10*3/uL (ref 4.0–10.5)

## 2010-12-25 LAB — APTT: aPTT: 32 seconds (ref 24–37)

## 2010-12-25 LAB — PROTIME-INR: INR: 1.11 (ref 0.00–1.49)

## 2010-12-25 MED ORDER — WARFARIN VIDEO
Freq: Once | Status: AC
  Administered 2010-12-25: 15:00:00

## 2010-12-25 MED ORDER — ENOXAPARIN SODIUM 60 MG/0.6ML ~~LOC~~ SOLN
50.0000 mg | Freq: Two times a day (BID) | SUBCUTANEOUS | Status: DC
  Administered 2010-12-25 – 2010-12-29 (×8): 50 mg via SUBCUTANEOUS
  Filled 2010-12-25 (×8): qty 0.6

## 2010-12-25 MED ORDER — BIOTENE DRY MOUTH MT LIQD
15.0000 mL | Freq: Two times a day (BID) | OROMUCOSAL | Status: DC
  Administered 2010-12-25 – 2010-12-30 (×10): 15 mL via OROMUCOSAL

## 2010-12-25 MED ORDER — PATIENT'S GUIDE TO USING COUMADIN BOOK
Freq: Once | Status: AC
  Administered 2010-12-25: 15:00:00
  Filled 2010-12-25: qty 1

## 2010-12-25 MED ORDER — INFLUENZA VIRUS VACC SPLIT PF IM SUSP
0.5000 mL | INTRAMUSCULAR | Status: AC
  Administered 2010-12-25: 0.5 mL via INTRAMUSCULAR
  Filled 2010-12-25: qty 0.5

## 2010-12-25 MED ORDER — WARFARIN SODIUM 5 MG PO TABS
5.0000 mg | ORAL_TABLET | Freq: Once | ORAL | Status: AC
  Administered 2010-12-25: 5 mg via ORAL
  Filled 2010-12-25: qty 1

## 2010-12-25 MED ORDER — POTASSIUM CHLORIDE 20 MEQ PO PACK
20.0000 meq | PACK | Freq: Every day | ORAL | Status: DC
  Administered 2010-12-26 – 2010-12-30 (×5): 20 meq via ORAL
  Filled 2010-12-25 (×7): qty 1

## 2010-12-25 MED ORDER — ENOXAPARIN SODIUM 30 MG/0.3ML ~~LOC~~ SOLN
1.0000 mg/kg | Freq: Two times a day (BID) | SUBCUTANEOUS | Status: DC
  Administered 2010-12-25: 45 mg via SUBCUTANEOUS
  Filled 2010-12-25: qty 0.3

## 2010-12-25 NOTE — Progress Notes (Signed)
CARE MANAGEMENT NOTE 12/25/2010  Patient:  [name], [name]   Account Number:  [id number]  Date Initiated:  12/25/2010  Documentation initiated by:  Rosemary Holms  Subjective/Objective Assessment:   Patient admitted with R. Leg pain and swelling. DVT     Action/Plan:   PTA, lives alone independently.   Anticipated DC Date:  12/28/2010   Anticipated DC Plan:  HOME/SELF CARE      DC Planning Services  CM consult      Choice offered to / List presented to:             Status of service:  In process, will continue to follow Medicare Important Message given?   (If response is "NO", the following Medicare IM given date fields will be blank) Date Medicare IM given:   Date Additional Medicare IM given:    Discharge Disposition:    Per UR Regulation:    Comments:  12/25/10 1610 Travarius Lange Leanord Hawking RN [name] CM CM checking benefits for [employer].

## 2010-12-25 NOTE — Consult Note (Signed)
ANTICOAGULATION CONSULT NOTE - Initial Consult  Pharmacy Consult for Warfarin and Lovenox Indication: VTE Treatment  No Known Allergies  Patient Measurements: Height: 5' (152.4 cm) Weight: 109 lb 5.6 oz (49.6 kg) IBW/kg (Calculated) : 45.5   Vital Signs: Temp: 98.3 F (36.8 C) (11/28 0528) Temp src: Oral (11/28 0528) BP: 139/83 mmHg (11/28 0528) Pulse Rate: 80  (11/28 0824)  Labs:  Basename 12/25/10 0512 12/24/10 1805  HGB 11.8* 12.5  HCT 35.2* 36.7  PLT 231 227  APTT 32 31  LABPROT 14.5 13.7  INR 1.11 1.03  HEPARINUNFRC -- --  CREATININE 0.57 0.55  CKTOTAL -- --  CKMB -- --  TROPONINI -- --   Estimated Creatinine Clearance: 49.7 ml/min (by C-G formula based on Cr of 0.57).  Medical History: Past Medical History  Diagnosis Date  . Hypertension   . Femoral DVT (deep venous thrombosis) 12/25/2010   Medications:  Scheduled:    . antiseptic oral rinse  15 mL Mouth Rinse BID  . atenolol  50 mg Oral BID  . calcium-vitamin D  1 tablet Oral BID  . docusate sodium  100 mg Oral BID  . enoxaparin  45 mg Subcutaneous Once  . enoxaparin  50 mg Subcutaneous Q12H  . hydrochlorothiazide  25 mg Oral Q0700  .  HYDROmorphone (DILAUDID) injection  1 mg Intravenous Once  .  HYDROmorphone (DILAUDID) injection  1 mg Intravenous Once  . influenza  inactive virus vaccine  0.5 mL Intramuscular Tomorrow-1000  . ondansetron  4 mg Intravenous Once  . potassium chloride  20 mEq Oral Daily  . potassium chloride  20 mEq Oral Once  . potassium chloride  40 mEq Oral Once  . sodium chloride  3 mL Intravenous Q12H  . warfarin  5 mg Oral ONCE-1800  . DISCONTD: enoxaparin  1 mg/kg Subcutaneous BID  . DISCONTD: enoxaparin  1 mg/kg Subcutaneous Q12H    Assessment: Renal fxn Ok  Goal of Therapy:  INR 2-3 Full anticoagulation w/ Lovenox (5 day over-lap)   Plan: Lovenox 50mg  (1mg /kg) sq q12hrs Coumadin 5mg  today x 1 5 day overlap of coumadin/lovenox until INR > 2 INR daily until  stable CBC per protocol Warfarin education   Valrie Hart A 12/25/2010,1:41 PM

## 2010-12-25 NOTE — Progress Notes (Signed)
Subjective: Patient does have some discomfort in her right leg but it is less than yesterday. She has no complaints of chest pain, pleurisy, or shortness of breath.  Objective: Vital signs in last 24 hours: Filed Vitals:   12/24/10 2315 12/25/10 0528 12/25/10 0752 12/25/10 0824  BP: 114/72 139/83    Pulse: 82 82  80  Temp: 97.9 F (36.6 C) 98.3 F (36.8 C)    TempSrc: Oral Oral    Resp: 18 20 19 17   Height: 5' (1.524 m)     Weight: 49.6 kg (109 lb 5.6 oz)     SpO2: 97% 97% 96% 95%    Intake/Output Summary (Last 24 hours) at 12/25/10 1255 Last data filed at 12/25/10 1000  Gross per 24 hour  Intake    120 ml  Output    550 ml  Net   -430 ml    Weight change:   Exam: Lungs: Clear to auscultation bilaterally. Breathing is nonlabored. Heart: S1, S2, with no murmurs rubs or gallops. Abdomen: Positive bowel sounds, soft, nontender, nondistended. Extremities: 3+ pitting edema on the right leg and no edema on the left leg. There is some global tenderness over her calf muscle. There is also mild global erythema from her right knee down to her ankle.    Lab Results: Basic Metabolic Panel:  Basename 12/25/10 0512 12/24/10 1805  NA 137 139  K 4.1 3.1*  CL 102 100  CO2 26 26  GLUCOSE 112* 120*  BUN 8 8  CREATININE 0.57 0.55  CALCIUM 9.3 9.2  MG -- --  PHOS -- --   Liver Function Tests:  [facility] 12/25/10 0512 12/24/10 1805  AST 40* 37  ALT 15 15  ALKPHOS 51 52  BILITOT 0.3 0.2*  PROT 6.9 7.1  ALBUMIN 2.8* 3.1*   No results found for this basename: LIPASE:2,AMYLASE:2 in the last 72 hours No results found for this basename: AMMONIA:2 in the last 72 hours CBC:  Basename 12/25/10 0512 12/24/10 1805  WBC 6.4 9.3  NEUTROABS -- 7.6  HGB 11.8* 12.5  HCT 35.2* 36.7  MCV 82.4 81.4  PLT 231 227   Cardiac Enzymes: No results found for this basename: CKTOTAL:3,CKMB:3,CKMBINDEX:3,TROPONINI:3 in the last 72 hours BNP: No results found for this basename: POCBNP:3 in the  last 72 hours D-Dimer:  [facility] 12/24/10 1805  DDIMER 3.16*   CBG: No results found for this basename: GLUCAP:6 in the last 72 hours Hemoglobin A1C: No results found for this basename: HGBA1C in the last 72 hours Fasting Lipid Panel: No results found for this basename: CHOL,HDL,LDLCALC,TRIG,CHOLHDL,LDLDIRECT in the last 72 hours Thyroid Function Tests: No results found for this basename: TSH,T4TOTAL,FREET4,T3FREE,THYROIDAB in the last 72 hours Anemia Panel: No results found for this basename: VITAMINB12,FOLATE,FERRITIN,TIBC,IRON,RETICCTPCT in the last 72 hours Coagulation:  Basename 12/25/10 0512 12/24/10 1805  LABPROT 14.5 13.7  INR 1.11 1.03   Urine Drug Screen: Drugs of Abuse  No results found for this basename: labopia, cocainscrnur, labbenz, amphetmu, thcu, labbarb    Alcohol Level: No results found for this basename: ETH:2 in the last 72 hours   Micro: No results found for this or any previous visit (from the past 240 hour(s)).  Studies/Results: US Venous Img Lower Bilateral  12/25/2010  *RADIOLOGY REPORT*  Clinical Data: Right leg pain and edema.  BILATERAL LOWER EXTREMITY VENOUS DOPPLER ULTRASOUND  Technique: Gray-scale sonography with compression, as well as color and duplex ultrasound, were performed to evaluate the deep venous system from the level of  the common femoral vein through the popliteal and proximal calf veins.  Comparison: None  Findings: On the right, there is  occlusive noncompressible thrombus in the femoral vein extending through the popliteal vein. Profunda femoris and common femoral veins remain patent. Noncompressible peroneal veins are noted.  On the left, normal compressibility of  the common femoral, superficial femoral, and popliteal veins, as well as the proximal calf veins.  No filling defects to suggest DVT on grayscale or color Doppler imaging.  Doppler waveforms show normal direction of venous flow, normal respiratory phasicity and response  to augmentation.  IMPRESSION: 1.  Occlusive right femoropopliteal DVT. 2.  No evidence of left lower extremity deep vein thrombosis.  Original Report Authenticated By: Osa Craver, M.D.    Medications: I have reviewed the patient's current medications.  Assessment: Principal Problem:  *Leg swelling Active Problems:  HTN (hypertension)  Pain  Hypokalemia  Femoral DVT (deep venous thrombosis)  Anemia  1. Right lower extremity deep vein thromboses. She did have an abdominal hysterectomy in June, however, I do not know if this is what made her hypercoagulable. Her sister also had a blood clot/DVT but it was attributed to oral contraceptive agents. Lovenox has been started. We will start Coumadin today.  Mild hypoxia. Apparently, shortly after admission, her oxygen saturation was 89%. She has no chest pain or pleurisy, although, she is obviously at risk for PE. Will hold off on ordering a CT angiogram and treat as usual for thromboembolic disease.  Hypertension. Controlled on atenolol and hydrochlorothiazide.  Hypokalemia. Resolved following repletion.  Anemia. Likely dilutional. Her hemoglobin was 12.5 on admission and is 11.8 today. Will add prophylactic H2 blocker on anticoagulant therapy.  Plan:  1. Restart full dose Lovenox and add Coumadin. [facility] consult pharmacy for monitoring, et cetera.  Add Pepcid twice a day prophylactically on anticoagulation.  Will order a hypercoagulable panel, except protein C, antithrombin 3, and protein S. The others should not be affected by active anticoagulation. We'll check her thyroid function with a TSH and free T4.   LOS: 1 day   Espiridion Supinski 12/25/2010, 12:55 PM

## 2010-12-26 DIAGNOSIS — R410 Disorientation, unspecified: Secondary | ICD-10-CM | POA: Diagnosis not present

## 2010-12-26 DIAGNOSIS — E871 Hypo-osmolality and hyponatremia: Secondary | ICD-10-CM | POA: Diagnosis not present

## 2010-12-26 LAB — BASIC METABOLIC PANEL
CO2: 27 mEq/L (ref 19–32)
Calcium: 9.2 mg/dL (ref 8.4–10.5)
Creatinine, Ser: 0.63 mg/dL (ref 0.50–1.10)
Glucose, Bld: 104 mg/dL — ABNORMAL HIGH (ref 70–99)
Sodium: 132 mEq/L — ABNORMAL LOW (ref 135–145)

## 2010-12-26 LAB — T4, FREE: Free T4: 1.24 ng/dL (ref 0.80–1.80)

## 2010-12-26 LAB — CBC
Hemoglobin: 10.9 g/dL — ABNORMAL LOW (ref 12.0–15.0)
MCH: 28 pg (ref 26.0–34.0)
MCV: 82.3 fL (ref 78.0–100.0)
RBC: 3.89 MIL/uL (ref 3.87–5.11)

## 2010-12-26 MED ORDER — MORPHINE SULFATE 4 MG/ML IJ SOLN: 1.0000 mg | INTRAMUSCULAR | Status: DC | PRN

## 2010-12-26 MED ORDER — WARFARIN SODIUM 7.5 MG PO TABS
7.5000 mg | ORAL_TABLET | Freq: Once | ORAL | Status: AC
  Administered 2010-12-26: 7.5 mg via ORAL
  Filled 2010-12-26: qty 1

## 2010-12-26 MED ORDER — PANTOPRAZOLE SODIUM 40 MG PO TBEC
40.0000 mg | DELAYED_RELEASE_TABLET | Freq: Every day | ORAL | Status: DC
  Administered 2010-12-26 – 2010-12-30 (×5): 40 mg via ORAL
  Filled 2010-12-26 (×5): qty 1

## 2010-12-26 MED ORDER — ATENOLOL 25 MG PO TABS
50.0000 mg | ORAL_TABLET | Freq: Two times a day (BID) | ORAL | Status: DC
  Administered 2010-12-26 – 2010-12-30 (×5): 50 mg via ORAL
  Filled 2010-12-26 (×4): qty 2
  Filled 2010-12-26: qty 1
  Filled 2010-12-26 (×3): qty 2

## 2010-12-26 NOTE — Consult Note (Addendum)
ANTICOAGULATION CONSULT NOTE -   Pharmacy Consult for Warfarin and Lovenox Indication: VTE Treatment  No Known Allergies  Patient Measurements: Height: 5' (152.4 cm) Weight: 109 lb 5.6 oz (49.6 kg) IBW/kg (Calculated) : 45.5   Vital Signs: Temp: 98.3 F (36.8 C) (11/29 0735) Temp src: Oral (11/29 0735) BP: 111/72 mmHg (11/29 0735) Pulse Rate: 90  (11/29 0735)  Labs:  Basename 12/26/10 0538 12/25/10 0512 12/24/10 1805  HGB 10.9* 11.8* --  HCT 32.0* 35.2* 36.7  PLT 204 231 227  APTT -- 32 31  LABPROT 14.6 14.5 13.7  INR 1.12 1.11 1.03  HEPARINUNFRC -- -- --  CREATININE 0.63 0.57 0.55  CKTOTAL -- -- --  CKMB -- -- --  TROPONINI -- -- --   Estimated Creatinine Clearance: 49.7 ml/min (by C-G formula based on Cr of 0.63).  Medical History: Past Medical History  Diagnosis Date  . Hypertension   . Femoral DVT (deep venous thrombosis) 12/25/2010   Medications:  Scheduled:     . antiseptic oral rinse  15 mL Mouth Rinse BID  . atenolol  50 mg Oral BID  . calcium-vitamin D  1 tablet Oral BID  . docusate sodium  100 mg Oral BID  . enoxaparin  50 mg Subcutaneous Q12H  . hydrochlorothiazide  25 mg Oral Q0700  . influenza  inactive virus vaccine  0.5 mL Intramuscular Tomorrow-1000  . patient's guide to using coumadin book   Does not apply Once  . potassium chloride  20 mEq Oral Daily  . sodium chloride  3 mL Intravenous Q12H  . warfarin  5 mg Oral ONCE-1800  . warfarin   Does not apply Once  . DISCONTD: enoxaparin  1 mg/kg Subcutaneous Q12H    Assessment:   Day 2 of 5 overlap. Sub-therapeutic INR. CBC stable. No bleeding reported.  Goal of Therapy:  INR 2-3 Full anticoagulation w/ Lovenox (5 day over-lap)   Plan: Lovenox 50mg  (1mg /kg) sq q12hrs Coumadin 7.5mg  today x 1 5 day overlap of coumadin/lovenox until INR > 2 INR daily until stable CBC per protocol Warfarin education done for both Mrs. Keltner and daughter.   Gilman Buttner, [place] J 12/26/2010,7:58  AM

## 2010-12-26 NOTE — Progress Notes (Addendum)
Subjective: The patient continues to have some discomfort in her right leg. No chest pain or pleurisy. Her sister reports that she has had some confusion.  Objective: Vital signs in last 24 hours: Filed Vitals:   12/26/10 0631 12/26/10 0735 12/26/10 0806 12/26/10 1419  BP: 136/85 111/72  118/76  Pulse: 98 90  91  Temp:  98.3 F (36.8 C)  98 F (36.7 C)  TempSrc:  Oral  Oral  Resp:    18  Height:      Weight:      SpO2:  91% 90% 97%    Intake/Output Summary (Last 24 hours) at 12/26/10 1558 Last data filed at 12/26/10 1235  Gross per 24 hour  Intake    320 ml  Output      0 ml  Net    320 ml    Weight change:   Exam: Lungs: Clear to auscultation bilaterally. Breathing is nonlabored. Heart: S1, S2, with no murmurs rubs or gallops. Abdomen: Positive bowel sounds, soft, nontender, nondistended. Extremities: 3+ pitting edema on the right leg and no edema on the left leg. There is some global tenderness over her calf muscle. There is also mild global erythema from her right knee down to her ankle. Neurologic: The patient is alert and oriented x2. Cranial nerves II through XII are intact. Her speech is clear. Globally deconditioned.    Lab Results: Basic Metabolic Panel:  Basename 12/26/10 0538 12/25/10 0512  NA 132* 137  K 3.6 4.1  CL 97 102  CO2 27 26  GLUCOSE 104* 112*  BUN 12 8  CREATININE 0.63 0.57  CALCIUM 9.2 9.3  MG -- --  PHOS -- --   Liver Function Tests:  [facility] 12/25/10 0512 12/24/10 1805  AST 40* 37  ALT 15 15  ALKPHOS 51 52  BILITOT 0.3 0.2*  PROT 6.9 7.1  ALBUMIN 2.8* 3.1*   No results found for this basename: LIPASE:2,AMYLASE:2 in the last 72 hours No results found for this basename: AMMONIA:2 in the last 72 hours CBC:  Basename 12/26/10 0538 12/25/10 0512 12/24/10 1805  WBC 10.4 6.4 --  NEUTROABS -- -- 7.6  HGB 10.9* 11.8* --  HCT 32.0* 35.2* --  MCV 82.3 82.4 --  PLT 204 231 --   Cardiac Enzymes: No results found for this  basename: CKTOTAL:3,CKMB:3,CKMBINDEX:3,TROPONINI:3 in the last 72 hours BNP: No results found for this basename: POCBNP:3 in the last 72 hours D-Dimer:  [facility] 12/24/10 1805  DDIMER 3.16*   CBG: No results found for this basename: GLUCAP:6 in the last 72 hours Hemoglobin A1C: No results found for this basename: HGBA1C in the last 72 hours Fasting Lipid Panel: No results found for this basename: CHOL,HDL,LDLCALC,TRIG,CHOLHDL,LDLDIRECT in the last 72 hours Thyroid Function Tests: No results found for this basename: TSH,T4TOTAL,FREET4,T3FREE,THYROIDAB in the last 72 hours Anemia Panel: No results found for this basename: VITAMINB12,FOLATE,FERRITIN,TIBC,IRON,RETICCTPCT in the last 72 hours Coagulation:  Basename 12/26/10 0538 12/25/10 0512  LABPROT 14.6 14.5  INR 1.12 1.11   Urine Drug Screen: Drugs of Abuse  No results found for this basename: labopia,  cocainscrnur,  labbenz,  amphetmu,  thcu,  labbarb    Alcohol Level: No results found for this basename: ETH:2 in the last 72 hours   Micro: No results found for this or any previous visit (from the past 240 hour(s)).  Studies/Results: US Venous Img Lower Bilateral  12/25/2010  *RADIOLOGY REPORT*  Clinical Data: Right leg pain and edema.  BILATERAL LOWER EXTREMITY  VENOUS DOPPLER ULTRASOUND  Technique: Gray-scale sonography with compression, as well as color and duplex ultrasound, were performed to evaluate the deep venous system from the level of the common femoral vein through the popliteal and proximal calf veins.  Comparison: None  Findings: On the right, there is  occlusive noncompressible thrombus in the femoral vein extending through the popliteal vein. Profunda femoris and common femoral veins remain patent. Noncompressible peroneal veins are noted.  On the left, normal compressibility of  the common femoral, superficial femoral, and popliteal veins, as well as the proximal calf veins.  No filling defects to suggest DVT on  grayscale or color Doppler imaging.  Doppler waveforms show normal direction of venous flow, normal respiratory phasicity and response to augmentation.  IMPRESSION: 1.  Occlusive right femoropopliteal DVT. 2.  No evidence of left lower extremity deep vein thrombosis.  Original Report Authenticated By: Osa Craver, M.D.    Medications: I have reviewed the patient's current medications.  Assessment: Principal Problem:  *Leg swelling Active Problems:  HTN (hypertension)  Pain  Hypokalemia  Femoral DVT (deep venous thrombosis)  Anemia  Confusion  1. Right lower extremity deep vein thrombosIS. She did have an abdominal hysterectomy in June, however, I do not know if this is what made her hypercoagulable. Her sister also had a blood clot/DVT but it was attributed to oral contraceptive agents. Lovenox and Coumadin have been started. Hypercoagulable panel with some exceptions has been ordered.  Mild hypoxia. Apparently, shortly after admission, her oxygen saturation was 89%. She has no chest pain or pleurisy, although, she is obviously at risk for PE. Will hold off on ordering a CT angiogram and treat as usual for thromboembolic disease.   Hypertension. Controlled on atenolol and hydrochlorothiazide. Her blood pressure is actually trending downward and we will therefore discontinue hydrochlorothiazide.  Hypokalemia. Resolved following repletion.  She has mild hyponatremia compared to yesterday. Will stop hydrochlorothiazide and recheck her serum sodium tomorrow.  Anemia. In part dilutional. However given anticoagulation, will start Protonix instead of Pepcid. Anemia panel will be ordered.  Plan:  Continue Lovenox and Coumadin per pharmacy. Discontinue Pepcid and add Protonix. Discontinue hydrochlorothiazide. Add blood pressure and heart rate parameters for atenolol. Physical therapy consultation. Anemia studies. Check the results of the hypercoagulable panel. Consider gentle IV  fluids if the patient's serum sodium is still low tomorrow. Decrease the dose of morphine and discontinued trazodone because of possible confusion.   LOS: 2 days   Toria Monte 12/26/2010, 3:58 PM

## 2010-12-27 ENCOUNTER — Encounter (HOSPITAL_COMMUNITY): Payer: Self-pay | Admitting: Internal Medicine

## 2010-12-27 DIAGNOSIS — D518 Other vitamin B12 deficiency anemias: Secondary | ICD-10-CM

## 2010-12-27 HISTORY — DX: Other vitamin B12 deficiency anemias: D51.8

## 2010-12-27 LAB — CBC
HCT: 30.8 % — ABNORMAL LOW (ref 36.0–46.0)
Hemoglobin: 10.6 g/dL — ABNORMAL LOW (ref 12.0–15.0)
MCH: 28.2 pg (ref 26.0–34.0)
MCV: 81.9 fL (ref 78.0–100.0)
Platelets: 213 10*3/uL (ref 150–400)
RBC: 3.76 MIL/uL — ABNORMAL LOW (ref 3.87–5.11)

## 2010-12-27 LAB — BASIC METABOLIC PANEL
CO2: 27 mEq/L (ref 19–32)
Calcium: 8.9 mg/dL (ref 8.4–10.5)
Glucose, Bld: 93 mg/dL (ref 70–99)
Sodium: 130 mEq/L — ABNORMAL LOW (ref 135–145)

## 2010-12-27 LAB — URINE MICROSCOPIC-ADD ON

## 2010-12-27 LAB — LUPUS ANTICOAGULANT PANEL: Lupus Anticoagulant: DETECTED — AB

## 2010-12-27 LAB — URINALYSIS, ROUTINE W REFLEX MICROSCOPIC
Bilirubin Urine: NEGATIVE
Ketones, ur: NEGATIVE mg/dL
Urobilinogen, UA: 4 mg/dL — ABNORMAL HIGH (ref 0.0–1.0)

## 2010-12-27 LAB — IRON AND TIBC
Iron: 15 ug/dL — ABNORMAL LOW (ref 42–135)
UIBC: 161 ug/dL (ref 125–400)

## 2010-12-27 LAB — FERRITIN: Ferritin: 312 ng/mL — ABNORMAL HIGH (ref 10–291)

## 2010-12-27 MED ORDER — CYANOCOBALAMIN 1000 MCG/ML IJ SOLN
1000.0000 ug | Freq: Every day | INTRAMUSCULAR | Status: DC
  Administered 2010-12-27 – 2010-12-30 (×4): 1000 ug via INTRAMUSCULAR
  Filled 2010-12-27 (×4): qty 1

## 2010-12-27 MED ORDER — WARFARIN SODIUM 7.5 MG PO TABS
7.5000 mg | ORAL_TABLET | Freq: Once | ORAL | Status: AC
  Administered 2010-12-27: 7.5 mg via ORAL
  Filled 2010-12-27: qty 1

## 2010-12-27 MED ORDER — SODIUM CHLORIDE 0.9 % IJ SOLN
INTRAMUSCULAR | Status: AC
  Filled 2010-12-27: qty 3

## 2010-12-27 MED ORDER — POTASSIUM CHLORIDE IN NACL 20-0.9 MEQ/L-% IV SOLN
INTRAVENOUS | Status: DC
  Administered 2010-12-27: 11:00:00 via INTRAVENOUS
  Administered 2010-12-28: 1000 mL via INTRAVENOUS

## 2010-12-27 MED ORDER — POLYSACCHARIDE IRON 150 MG PO CAPS
150.0000 mg | ORAL_CAPSULE | Freq: Every day | ORAL | Status: DC
  Administered 2010-12-27 – 2010-12-30 (×4): 150 mg via ORAL
  Filled 2010-12-27 (×4): qty 1

## 2010-12-27 NOTE — Progress Notes (Signed)
UR Chart Review Completed  

## 2010-12-27 NOTE — Progress Notes (Signed)
Have left sticky note asking for clarification of level of care order.  Currently an order for observation and inpatient written on admission

## 2010-12-27 NOTE — Consult Note (Signed)
ANTICOAGULATION CONSULT NOTE -   Pharmacy Consult for Warfarin and Lovenox Indication: VTE Treatment  No Known Allergies  Patient Measurements: Height: 5' (152.4 cm) Weight: 112 lb (50.803 kg) IBW/kg (Calculated) : 45.5   Vital Signs: Temp: 97.9 F (36.6 C) (11/30 0438) Temp src: Oral (11/30 0438) BP: 123/77 mmHg (11/30 0438) Pulse Rate: 89  (11/30 0438)  Labs:  Basename 12/27/10 0528 12/26/10 0538 12/25/10 0512 12/24/10 1805  HGB 10.6* 10.9* -- --  HCT 30.8* 32.0* 35.2* --  PLT 213 204 231 --  APTT -- -- 32 31  LABPROT 18.0* 14.6 14.5 --  INR 1.46 1.12 1.11 --  HEPARINUNFRC -- -- -- --  CREATININE 0.62 0.63 0.57 --  CKTOTAL -- -- -- --  CKMB -- -- -- --  TROPONINI -- -- -- --   Estimated Creatinine Clearance: 49.7 ml/min (by C-G formula based on Cr of 0.62).  Medical History: Past Medical History  Diagnosis Date  . Hypertension   . Femoral DVT (deep venous thrombosis) 12/25/2010   Medications:  Scheduled:     . antiseptic oral rinse  15 mL Mouth Rinse BID  . atenolol  50 mg Oral BID  . calcium-vitamin D  1 tablet Oral BID  . docusate sodium  100 mg Oral BID  . enoxaparin  50 mg Subcutaneous Q12H  . pantoprazole  40 mg Oral Q1200  . potassium chloride  20 mEq Oral Daily  . sodium chloride  3 mL Intravenous Q12H  . sodium chloride      . warfarin  7.5 mg Oral ONCE-1800  . DISCONTD: atenolol  50 mg Oral BID  . DISCONTD: hydrochlorothiazide  25 mg Oral Q0700   Assessment: Day 3 of 5 overlap. Sub-therapeutic INR. CBC stable. No bleeding reported.  Goal of Therapy:  INR 2-3 Full anticoagulation w/ Lovenox (5 day over-lap)   Plan: Lovenox 50mg  (1mg /kg) sq q12hrs Coumadin 7.5mg  today x 1 5 day overlap of coumadin/lovenox until INR > 2 INR daily until stable CBC per protocol Warfarin education done for both Mrs. Kern and daughter.   Margo Aye, Tray Klayman A 12/27/2010,9:12 AM

## 2010-12-27 NOTE — Progress Notes (Signed)
Physical Therapy Evaluation Patient Details Name: [name] MRN: [id number] DOB: 1945/04/08 Today's Date: 12/27/2010  Problem List:  Patient Active Problem List  Diagnoses  . Leg swelling  . HTN (hypertension)  . Pain  . Hypokalemia  . Femoral DVT (deep venous thrombosis)  . Anemia  . Confusion  . Hyponatremia    Past Medical History:  Past Medical History  Diagnosis Date  . Hypertension   . Femoral DVT (deep venous thrombosis) 12/25/2010   Past Surgical History:  Past Surgical History  Procedure Date  . Abdominal hysterectomy     06/2010    PT Assessment/Plan/Recommendation PT Assessment Clinical Impression Statement: pt has severe edema RLE, c/o severe pain in that leg with any weight bearing...there is significant weakness in RLE and she needs assist with all transfers...unable to take any steps due to pain with weightbearing...recommend SNF at d/c (SNF will address DME needs) PT Recommendation/Assessment: Patient will need skilled PT in the acute care venue PT Problem List: Decreased strength;Decreased activity tolerance;Decreased mobility;Decreased knowledge of use of DME;Decreased safety awareness;Decreased knowledge of precautions;Pain Barriers to Discharge: Decreased caregiver support PT Therapy Diagnosis : Difficulty walking;Generalized weakness;Acute pain PT Plan PT Frequency: Min 3X/week PT Treatment/Interventions: DME instruction;Gait training;Functional mobility training;Therapeutic exercise;Patient/family education PT Recommendation Follow Up Recommendations: Skilled nursing facility Equipment Recommended: Defer to next venue PT Goals  Acute Rehab PT Goals PT Goal Formulation: With patient Time For Goal Achievement: 2 weeks Pt will go Supine/Side to Sit: with min assist Pt will go Sit to Supine/Side: with min assist Pt will Transfer Bed to Chair/Chair to Bed: with min assist Pt will Ambulate: 1 - 15 feet;with mod assist;with rolling walker  PT  Evaluation Precautions/Restrictions  Precautions Precautions: Fall Required Braces or Orthoses: No Restrictions Weight Bearing Restrictions: No Prior Functioning  Home Living Lives With: Alone Receives Help From: Family Type of Home: House Home Layout: Two level;Able to live on main level with bedroom/bathroom Alternate Level Stairs-Rails: Right Alternate Level Stairs-Number of Steps: full flight-doesn't have to go upstairs Home Access: Stairs to enter Entrance Stairs-Rails: Right Entrance Stairs-Number of Steps: 2 Home Adaptive Equipment: None Prior Function Level of Independence: Independent with basic ADLs;Independent with homemaking with ambulation;Independent with gait;Independent with transfers Driving: Yes Vocation: Part time employment Cognition Cognition Arousal/Alertness: Awake/alert Overall Cognitive Status: Appears within functional limits for tasks assessed Sensation/Coordination Sensation Light Touch: Appears Intact Stereognosis: Not tested Hot/Cold: Not tested Proprioception: Not tested Coordination Gross Motor Movements are Fluid and Coordinated: Yes Extremity Assessment RUE Assessment RUE Assessment: Within Functional Limits LUE Assessment LUE Assessment: Within Functional Limits (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) RLE Assessment RLE Assessment: Exceptions to [facility] RLE Strength RLE Overall Strength Comments: 2/5...severe edema through entire  leg LLE Assessment LLE Assessment: Within Functional Limits Mobility (including Balance) Bed Mobility Bed Mobility: Yes Supine to Sit: 4: Min assist;HOB elevated (Comment degrees) Supine to Sit Details (indicate cue type and reason): HOB at 60 deg...needs assist to move RLE Sitting - Scoot to Edge of Bed: 6: Modified independent (Device/Increase time) Transfers Transfers: Yes Sit to Stand: 3: Mod assist;From bed;With upper extremity assist Sit to Stand Details (indicate cue type and reason): very  unsteady Stand to Sit: 4: Min assist Lateral/Scoot Transfers: 6: Modified independent (Device/Increase time) Ambulation/Gait Ambulation/Gait: No (unable to take even one step) Stairs: No Wheelchair Mobility Wheelchair Mobility: No  Posture/Postural Control Posture/Postural Control: No significant limitations Balance Balance Assessed: No Exercise    End of Session PT - End of Session Equipment  Utilized During Treatment: Gait belt Activity Tolerance: Patient tolerated treatment well Patient left: in bed;with call bell in reach;with bed alarm set General Behavior During Session: [facility] for tasks performed Cognition: [facility] for tasks performed  Konrad Penta 12/27/2010, 11:47 AM

## 2010-12-27 NOTE — Progress Notes (Signed)
Subjective: The patient has less discomfort in her right leg at rest. She has noticed that some of the swelling has gone down.  Objective: Vital signs in last 24 hours: Filed Vitals:   12/26/10 0806 12/26/10 1419 12/26/10 2116 12/27/10 0438  BP:  118/76 116/74 123/77  Pulse:  91 98 89  Temp:  98 F (36.7 C) 98.9 F (37.2 C) 97.9 F (36.6 C)  TempSrc:  Oral Oral Oral  Resp:  18 18 16   Height:      Weight:    50.803 kg (112 lb)  SpO2: 90% 97% 98% 94%    Intake/Output Summary (Last 24 hours) at 12/27/10 1249 Last data filed at 12/27/10 1200  Gross per 24 hour  Intake    720 ml  Output      0 ml  Net    720 ml    Weight change:   Exam: Lungs: Clear to auscultation bilaterally. Breathing is nonlabored. Heart: S1, S2, with no murmurs rubs or gallops. Abdomen: Positive bowel sounds, soft, nontender, nondistended. Extremities: Now, slightly less than 3+ pitting edema on the right leg and no edema on the left leg. There is some global tenderness over her left thigh and calf muscle. There less mild global erythema from her right knee down to her ankle. Neurologic: The patient is alert and oriented x2. Cranial nerves II through XII are intact. Her speech is clear. Globally deconditioned.    Lab Results: Basic Metabolic Panel:  Basename 12/27/10 0528 12/26/10 0538  NA 130* 132*  K 3.7 3.6  CL 93* 97  CO2 27 27  GLUCOSE 93 104*  BUN 12 12  CREATININE 0.62 0.63  CALCIUM 8.9 9.2  MG -- --  PHOS -- --   Liver Function Tests:  [facility] 12/25/10 0512 12/24/10 1805  AST 40* 37  ALT 15 15  ALKPHOS 51 52  BILITOT 0.3 0.2*  PROT 6.9 7.1  ALBUMIN 2.8* 3.1*   No results found for this basename: LIPASE:2,AMYLASE:2 in the last 72 hours No results found for this basename: AMMONIA:2 in the last 72 hours CBC:  Basename 12/27/10 0528 12/26/10 0538 12/24/10 1805  WBC 11.2* 10.4 --  NEUTROABS -- -- 7.6  HGB 10.6* 10.9* --  HCT 30.8* 32.0* --  MCV 81.9 82.3 --  PLT 213 204 --    Cardiac Enzymes: No results found for this basename: CKTOTAL:3,CKMB:3,CKMBINDEX:3,TROPONINI:3 in the last 72 hours BNP: No results found for this basename: POCBNP:3 in the last 72 hours D-Dimer:  [facility] 12/24/10 1805  DDIMER 3.16*   CBG: No results found for this basename: GLUCAP:6 in the last 72 hours Hemoglobin A1C: No results found for this basename: HGBA1C in the last 72 hours Fasting Lipid Panel: No results found for this basename: CHOL,HDL,LDLCALC,TRIG,CHOLHDL,LDLDIRECT in the last 72 hours Thyroid Function Tests:  [facility] 12/26/10 0538  TSH 1.137  T4TOTAL --  FREET4 1.24  T3FREE --  THYROIDAB --   Anemia Panel:  Basename 12/26/10 0538  VITAMINB12 189*  FOLATE --  FERRITIN 312*  TIBC 176*  IRON 15*  RETICCTPCT --   Coagulation:  Basename 12/27/10 0528 12/26/10 0538  LABPROT 18.0* 14.6  INR 1.46 1.12   Urine Drug Screen: Drugs of Abuse  No results found for this basename: labopia,  cocainscrnur,  labbenz,  amphetmu,  thcu,  labbarb    Alcohol Level: No results found for this basename: ETH:2 in the last 72 hours   Micro: No results found for this or any previous  visit (from the past 240 hour(s)).  Studies/Results: No results found.  Medications: I have reviewed the patient's current medications.  Assessment: Principal Problem:  *Leg swelling Active Problems:  HTN (hypertension)  Pain  Hypokalemia  Femoral DVT (deep venous thrombosis)  Anemia  Confusion  Hyponatremia  Vitamin B12 deficiency (dietary) anemia  1. Right lower extremity deep vein thrombosis. She did have an abdominal hysterectomy in June, however, I do not know if this is what made her hypercoagulable. Her sister also had a blood clot/DVT but it was attributed to oral contraceptive agents. Lovenox and Coumadin have been started. Hypercoagulable panel with some exceptions has been ordered.  Mild hypoxia. Her oxygen saturation has improved. Apparently, shortly after  admission, her oxygen saturation was 89%. She has no chest pain or pleurisy, although, she is obviously at risk for PE. Will hold off on ordering a CT angiogram and treat as usual for thromboembolic disease.  Anemia. The anemia panel revealed iron deficiency and vitamin B 12 deficiency. Supplementation will be started. Protonix was started empirically yesterday.  Hyponatremia. Hydrochlorothiazide has been discontinued. We'll start gentle IV fluids.  Hypertension. Controlled on atenolol.  Hypokalemia. Resolved following repletion.  Deconditioning as noted by the physical therapist. The patient may require skilled nursing facility placement at the time of discharge.    Plan:  Start vitamin B12 supplementation and Nu Iron. Start gentle normal saline. Continue hospitalization until the patient's INR is between 2 and 3. Continue supportive treatment. Check the results of the hypercoagulable panel pending.    LOS: 3 days   Dang Mathison 12/27/2010, 12:49 PM

## 2010-12-27 NOTE — Progress Notes (Signed)
Physical Therapy Treatment Patient Details Name: [name] MRN: [id number] DOB: 1945-03-09 Today's Date: 12/27/2010  PT Assessment/Plan  PT - Assessment/Plan PT Frequency: Min 3X/week Follow Up Recommendations: Skilled nursing facility Equipment Recommended: Defer to next venue PT Goals  Acute Rehab PT Goals PT Goal Formulation: With patient Time For Goal Achievement: 2 weeks Pt will go Supine/Side to Sit: with min assist Pt will go Sit to Supine/Side: with min assist Pt will Transfer Bed to Chair/Chair to Bed: with min assist PT Transfer Goal: Bed to Chair/Chair to Bed - Progress: Progressing toward goal Pt will Ambulate: 1 - 15 feet;with mod assist;with rolling walker PT Goal: Ambulate - Progress: Not met  PT Treatment Precautions/Restrictions  Precautions Precautions: Fall Required Braces or Orthoses: No Restrictions Weight Bearing Restrictions: No Mobility (including Balance) Bed Mobility Bed Mobility: Yes Supine to Sit: 4: Min assist;HOB elevated (Comment degrees) Supine to Sit Details (indicate cue type and reason): HOB at 60 deg...needs assist to move RLE Sitting - Scoot to Edge of Bed: 6: Modified independent (Device/Increase time) Transfers Transfers: Yes Sit to Stand: 3: Mod assist;From bed;With upper extremity assist Sit to Stand Details (indicate cue type and reason): very unsteady Stand to Sit: 4: Min assist Stand Pivot Transfers: 3: Mod assist Stand Pivot Transfer Details (indicate cue type and reason): able to use walker for transfer with max effort, slides feet ...unable to take a step Lateral/Scoot Transfers: 6: Modified independent (Device/Increase time) Ambulation/Gait Ambulation/Gait: No (another attempt made, but pt unable) Stairs: No Wheelchair Mobility Wheelchair Mobility: No  Posture/Postural Control Posture/Postural Control: No significant limitations Balance Balance Assessed: No Exercise  General Exercises - Lower Extremity Ankle  Circles/Pumps: AROM;Both;10 reps;Supine Quad Sets: AROM;Both;10 reps;Supine Gluteal Sets: AROM;Both;10 reps;Supine Heel Slides: AAROM;Right;10 reps;Supine Hip ABduction/ADduction: AAROM;Right;10 reps;Supine End of Session PT - End of Session Equipment Utilized During Treatment: Gait belt Activity Tolerance: Patient tolerated treatment well;Patient limited by pain Patient left: in bed;with call bell in reach;with bed alarm set;with family/visitor present Nurse Communication: Mobility status for transfers General Behavior During Session: [facility] for tasks performed Cognition: [facility] for tasks performed  Konrad Penta 12/27/2010, 2:43 PM

## 2010-12-28 ENCOUNTER — Inpatient Hospital Stay (HOSPITAL_COMMUNITY): Payer: Medicare Other

## 2010-12-28 LAB — BASIC METABOLIC PANEL
CO2: 27 mEq/L (ref 19–32)
Calcium: 8.5 mg/dL (ref 8.4–10.5)
Creatinine, Ser: 0.54 mg/dL (ref 0.50–1.10)
Glucose, Bld: 94 mg/dL (ref 70–99)

## 2010-12-28 LAB — CBC
MCH: 28.3 pg (ref 26.0–34.0)
MCV: 82.2 fL (ref 78.0–100.0)
Platelets: 201 10*3/uL (ref 150–400)
RBC: 3.43 MIL/uL — ABNORMAL LOW (ref 3.87–5.11)
RDW: 15.6 % — ABNORMAL HIGH (ref 11.5–15.5)

## 2010-12-28 MED ORDER — WARFARIN SODIUM 5 MG PO TABS
5.0000 mg | ORAL_TABLET | Freq: Once | ORAL | Status: AC
  Administered 2010-12-28: 5 mg via ORAL
  Filled 2010-12-28: qty 1

## 2010-12-28 MED ORDER — FUROSEMIDE 40 MG PO TABS
40.0000 mg | ORAL_TABLET | Freq: Every day | ORAL | Status: DC
  Administered 2010-12-28 – 2010-12-30 (×3): 40 mg via ORAL
  Filled 2010-12-28 (×3): qty 1

## 2010-12-28 NOTE — Progress Notes (Signed)
Patient spiked temperature of 102.8 at approximately 10:00 pm on 12/27/10.  Tylenol was given and order was put in for blood cultures, which have been drawn and are pending.  Ice used under arms of patient and cold cloth kept on head to help cool her down.  She did not have any complaints at the time of the temperature spike.

## 2010-12-28 NOTE — Progress Notes (Signed)
Subjective: S: Swelling in the right lower leg improving, no shortness of breath or any pain  Objective: Vital signs in last 24 hours: Filed Vitals:   12/27/10 2205 12/27/10 2346 12/28/10 0507 12/28/10 0943  BP: 92/60  91/63 115/73  Pulse: 87  85   Temp: 102.8 F (39.3 C) 101.1 F (38.4 C) 98.9 F (37.2 C)   TempSrc: Oral Oral Oral   Resp: 20  20   Height:      Weight:      SpO2: 95%  95%     Intake/Output Summary (Last 24 hours) at 12/28/10 1259 Last data filed at 12/27/10 2100  Gross per 24 hour  Intake   1008 ml  Output      0 ml  Net   1008 ml    Weight change:   Exam: Gen: Alert and oriented x3, NAD Lungs: Clear to auscultation bilaterally. Heart: S1, S2, with no murmurs rubs or gallops. Abdomen: Positive bowel sounds, soft, nontender, nondistended. Extremities: pitting edema on the right leg and no edema on the left leg. Neurologic: The patient is alert and oriented x2. Cranial nerves II through XII are intact. Her speech is clear. Globally deconditioned.    Lab Results: Basic Metabolic Panel:  Basename 12/28/10 0440 12/27/10 0528  NA 132* 130*  K 4.0 3.7  CL 97 93*  CO2 27 27  GLUCOSE 94 93  BUN 11 12  CREATININE 0.54 0.62  CALCIUM 8.5 8.9  MG -- --  PHOS -- --     Basename 12/28/10 0440 12/27/10 0528  WBC 6.6 11.2*  NEUTROABS -- --  HGB 9.7* 10.6*  HCT 28.2* 30.8*  MCV 82.2 81.9  PLT 201 213   Thyroid Function Tests:  Basename 12/26/10 0538  TSH 1.137  T4TOTAL --  FREET4 1.24  T3FREE --  THYROIDAB --   Anemia Panel:  Basename 12/26/10 0538  VITAMINB12 189*  FOLATE --  FERRITIN 312*  TIBC 176*  IRON 15*  RETICCTPCT --   Coagulation:  Basename 12/28/10 0440 12/27/10 0528  LABPROT 23.4* 18.0*  INR 2.04* 1.46     Micro: Recent Results (from the past 240 hour(s))  CULTURE, BLOOD (ROUTINE X 2)     Status: Normal (Preliminary result)   Collection Time   12/28/10 12:27 AM      Component Value Range Status Comment   Specimen Description BLOOD BLOOD RIGHT HAND   Final    Special Requests BOTTLES DRAWN AEROBIC AND ANAEROBIC 6CC   Final    Culture NO GROWTH <24 HRS   Final    Report Status PENDING   Incomplete   CULTURE, BLOOD (ROUTINE X 2)     Status: Normal (Preliminary result)   Collection Time   12/28/10 12:31 AM      Component Value Range Status Comment   Specimen Description BLOOD BLOOD LEFT ARM   Final    Special Requests BOTTLES DRAWN AEROBIC AND ANAEROBIC 6CC   Final    Culture NO GROWTH <24 HRS   Final    Report Status PENDING   Incomplete     Studies/Results: No results found.  Medications: I have reviewed the patient's current medications.  Assessment: Principal Problem:  *Leg swelling Active Problems:  HTN (hypertension)  Pain  Hypokalemia  Femoral DVT (deep venous thrombosis)  Anemia  Confusion  Hyponatremia  Vitamin B12 deficiency (dietary) anemia  Right lower extremity deep vein thrombosis - Continue Lovenox (day #4) with Coumadin.  -  Hypercoagulable panel with  some exceptions has been ordered, lupus anticoagulant positive on the hyper coag panel, patient will need to have confirmatory test repeated again in 4-6 weeks.  Mild hypoxia.: Improved  Fevers: No leukocytosis, no obvious source of infection. Monitor closely blood cultures 2/2 negative, UA negative for any UTI, will get CXR. Hold off on antibiotics for now.  Anemia. The anemia panel revealed iron deficiency and vitamin B 12 deficiency. Supplementation started. Protonix was started empirically yesterday.  Hyponatremia. Hydrochlorothiazide has been discontinued, improving, DC IV fluids secondary to right lower extremity edema.  Hypertension. Controlled on atenolol.  Deconditioning as noted by the physical therapist. The patient may require SNF at the time of discharge.      LOS: 4 days   RAI,RIPUDEEP 12/28/2010, 12:59 PM

## 2010-12-28 NOTE — Progress Notes (Signed)
Physical Therapy Treatment Patient Details Name: [name] MRN: [id number] DOB: 06/14/1945 Today's Date: 12/28/2010 Time: 11:30- 12:20 Charge: therapeutic activities: 30 min Gait 20 min PT Assessment/Plan  PT - Assessment/Plan Comments on Treatment Session: Pt still unable to ambulate with R LE, pt. slides LE with transfer.  She required assistance to move R LE in therapeutic activities in bed prior gait. Equipment Recommended: Rolling walker with 5" wheels PT Goals     PT Treatment Precautions/Restrictions  Precautions Precautions: Fall Required Braces or Orthoses: No Restrictions Weight Bearing Restrictions: No Mobility (including Balance) Bed Mobility Bed Mobility: Yes Supine to Sit: 4: Min assist;HOB flat Supine to Sit Details (indicate cue type and reason): assistance to move R LE Sitting - Scoot to Edge of Bed: 6: Modified independent (Device/Increase time) Transfers Transfers: Yes Sit to Stand: 3: Mod assist;With upper extremity assist Sit to Stand Details (indicate cue type and reason): vc-ing to push with UE from bed to stand, feel chair on back of legs and reach with UE for chair.  Pt very unsteady. Stand to Sit: 4: Min assist Stand to Sit Details: vc-ing for feel chair on back of legs and reach with UE for chai Stand Pivot Transfer Details (indicate cue type and reason): able to use walker for transfer with max effort, slides feet ...unable to take a step.  vc-ing for proper positioning with walker    Exercise  General Exercises - Lower Extremity Ankle Circles/Pumps: AAROM;Right;10 reps Quad Sets: AAROM;Right;10 reps Gluteal Sets: AROM;Both;10 reps Short Arc QuadBarbaraann Diaz;Right;10 reps Heel Slides: AAROM;Right;10 reps Hip ABduction/ADduction: AAROM;Both;10 reps Straight Leg Raises: AAROM;Right;10 reps End of Session PT - End of Session Equipment Utilized During Treatment: Gait belt Activity Tolerance: Patient tolerated treatment well;Patient limited by  fatigue;Patient limited by pain Patient left: in chair;with call bell in reach General Behavior During Session: [facility] for tasks performed Cognition: [facility] for tasks performed  Juel Burrow 12/28/2010, 12:21 PM

## 2010-12-28 NOTE — Consult Note (Signed)
ANTICOAGULATION CONSULT NOTE -   Pharmacy Consult for Warfarin and Lovenox Indication: VTE Treatment  No Known Allergies  Patient Measurements: Height: 5' (152.4 cm) Weight: 112 lb (50.803 kg) IBW/kg (Calculated) : 45.5   Vital Signs: Temp: 98.9 F (37.2 C) (12/01 0507) Temp src: Oral (12/01 0507) BP: 91/63 mmHg (12/01 0507) Pulse Rate: 85  (12/01 0507)  Labs:  Basename 12/28/10 0440 12/27/10 0528 12/26/10 0538  HGB 9.7* 10.6* --  HCT 28.2* 30.8* 32.0*  PLT 201 213 204  APTT -- -- --  LABPROT 23.4* 18.0* 14.6  INR 2.04* 1.46 1.12  HEPARINUNFRC -- -- --  CREATININE 0.54 0.62 0.63  CKTOTAL -- -- --  CKMB -- -- --  TROPONINI -- -- --   Estimated Creatinine Clearance: 49.7 ml/min (by C-G formula based on Cr of 0.54).  Medical History: Past Medical History  Diagnosis Date  . Hypertension   . 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 Vitamin B12 deficiency (dietary) anemia 12/27/2010   Medications:  Scheduled:     . antiseptic oral rinse  15 mL Mouth Rinse BID  . atenolol  50 mg Oral BID  . calcium-vitamin D  1 tablet Oral BID  . cyanocobalamin  1,000 mcg Intramuscular Daily  . docusate sodium  100 mg Oral BID  . enoxaparin  50 mg Subcutaneous Q12H  . pantoprazole  40 mg Oral Q1200  . polysaccharide iron  150 mg Oral Daily  . potassium chloride  20 mEq Oral Daily  . sodium chloride  3 mL Intravenous Q12H  . sodium chloride      . warfarin  7.5 mg Oral ONCE-1800   Assessment: Day 4 of 5 overlap. Therapeutic INR. H/H dropping slightly. No bleeding reported.  Goal of Therapy:  INR 2-3 Full anticoagulation w/ Lovenox (5 day over-lap)   Plan: Lovenox 50mg  (1mg /kg) sq q12hrs Coumadin 5mg  today x 1 5 day overlap of coumadin/lovenox until INR > 2 INR daily until stable CBC per protocol Warfarin education done for both Mrs. Nieland and daughter previously.   Lamonte Richer R 12/28/2010,9:43 AM

## 2010-12-29 LAB — CBC
MCHC: 34.1 g/dL (ref 30.0–36.0)
MCV: 82 fL (ref 78.0–100.0)
Platelets: 256 10*3/uL (ref 150–400)
RDW: 15.6 % — ABNORMAL HIGH (ref 11.5–15.5)
WBC: 6.7 10*3/uL (ref 4.0–10.5)

## 2010-12-29 LAB — BASIC METABOLIC PANEL
Calcium: 8.9 mg/dL (ref 8.4–10.5)
Chloride: 95 mEq/L — ABNORMAL LOW (ref 96–112)
Creatinine, Ser: 0.63 mg/dL (ref 0.50–1.10)
GFR calc Af Amer: >90 mL/min (ref >90)
GFR calc non Af Amer: >90 mL/min (ref >90)

## 2010-12-29 LAB — URINE CULTURE: Culture: NO GROWTH

## 2010-12-29 MED ORDER — WARFARIN SODIUM 5 MG PO TABS
5.0000 mg | ORAL_TABLET | Freq: Once | ORAL | Status: AC
  Administered 2010-12-29: 5 mg via ORAL
  Filled 2010-12-29: qty 1

## 2010-12-29 NOTE — Progress Notes (Signed)
Subjective: S: Swelling in the right lower leg improving, no shortness of breath or any pain  Objective: Vital signs in last 24 hours: Blood pressure 105/65, pulse 91, temperature 99.1 F (37.3 C), temperature source Oral, resp. rate 16, height 5' (1.524 m), weight 50.803 kg (112 lb), SpO2 98.00%. Filed Vitals:                                                          Intake/Output Summary (Last 24 hours) at 12/29/10 1043 Last data filed at 12/29/10 0830  Gross per 24 hour  Intake    320 ml  Output    300 ml  Net     20 ml    Weight change:   Exam: Gen: Alert and oriented x3, NAD Lungs: Clear to auscultation bilaterally. Heart: S1, S2, with no murmurs rubs or gallops. Abdomen: Positive bowel sounds, soft, nontender, nondistended. Extremities: pitting edema on the right leg and no edema on the left leg. Neurologic: The patient is alert and oriented x2. Cranial nerves II through XII are intact. Her speech is clear. Globally deconditioned.   Lab Results: Basic Metabolic Panel:  Basename 12/29/10 0456 12/28/10 0440  NA 132* 132*  K 3.8 4.0  CL 95* 97  CO2 29 27  GLUCOSE 103* 94  BUN 13 11  CREATININE 0.63 0.54  CALCIUM 8.9 8.5  MG -- --  PHOS -- --     Basename 12/29/10 0456 12/28/10 0440  WBC 6.7 6.6  NEUTROABS -- --  HGB 9.8* 9.7*  HCT 28.7* 28.2*  MCV 82.0 82.2  PLT 256 201   Coagulation:  Basename 12/29/10 0456 12/28/10 0440  LABPROT 28.0* 23.4*  INR 2.57* 2.04*     Micro: Recent Results (from the past 240 hour(s))  URINE CULTURE     Status: Normal   Collection Time   12/27/10  3:43 PM      Component Value Range Status Comment   Specimen Description URINE, CATHETERIZED   Final    Special Requests NONE   Final    Setup Time [id number]   Final    Colony Count NO GROWTH   Final    Culture NO GROWTH   Final    Report Status 12/29/2010 FINAL   Final   CULTURE, BLOOD (ROUTINE X 2)     Status: Normal (Preliminary result)   Collection Time     12/28/10 12:27 AM      Component Value Range Status Comment   Specimen Description BLOOD BLOOD RIGHT HAND   Final    Special Requests BOTTLES DRAWN AEROBIC AND ANAEROBIC 6CC   Final    Culture NO GROWTH 1 DAY   Final    Report Status PENDING   Incomplete   CULTURE, BLOOD (ROUTINE X 2)     Status: Normal (Preliminary result)   Collection Time   12/28/10 12:31 AM      Component Value Range Status Comment   Specimen Description BLOOD BLOOD LEFT ARM   Final    Special Requests BOTTLES DRAWN AEROBIC AND ANAEROBIC 6CC   Final    Culture NO GROWTH 1 DAY   Final    Report Status PENDING   Incomplete     Studies/Results: Dg Chest 2 View  12/28/2010  *RADIOLOGY REPORT*  Clinical Data: Fevers, rule  out pneumonia.  CHEST - 2 VIEW  Comparison: None  Findings: Two views of the chest were obtained.  The lungs are clear without airspace disease.  Prominent right hilar structures could be related to vessels but difficult to exclude lymphadenopathy, particularly on the lateral view. There is nodularity in the right hilar region on the lateral view.  Bony structures are intact.  IMPRESSION: Prominent right hilar structures as described.  An old comparison exam would be useful if available.  Otherwise, this area could be evaluated with a chest CT with IV contrast.  No focal airspace disease.  Original Report Authenticated By: Richarda Overlie, M.D.    Medications: I have reviewed the patient's current medications.  Assessment: Principal Problem:  *Leg swelling Active Problems:  HTN (hypertension)  Pain  Hypokalemia  Femoral DVT (deep venous thrombosis)  Anemia  Confusion  Hyponatremia  Vitamin B12 deficiency (dietary) anemia  Right lower extremity deep vein thrombosis - Continue Lovenox (day #5) with Coumadin, INR therapeutic 2.57. DC Lovenox, continue Coumadin  - lupus anticoagulant positive on the hyper coag panel, patient will need to have confirmatory test repeated again in 4-6 weeks.  Fevers: No  leukocytosis, no obvious source of infection. Monitor closely, blood cultures 2/2 negative, UA negative for any UTI, chest x-ray negative for any focal airspace disease  Anemia. The anemia panel revealed iron deficiency and vitamin B 12 deficiency, on supplementation and Protonix.  Hyponatremia. Hydrochlorothiazide has been discontinued, improving.  Hypertension. Controlled on atenolol.  Deconditioning as noted by the physical therapist. The patient require SNF at the time of discharge. DC when SNF bed available, the patient and daughter are agreeable for the skilled nursing facility.     LOS: 5 days   RAI,RIPUDEEP 12/29/2010, 10:43 AM

## 2010-12-29 NOTE — Consult Note (Signed)
ANTICOAGULATION CONSULT NOTE -   Pharmacy Consult for Warfarin and Lovenox Indication: VTE Treatment  No Known Allergies  Patient Measurements: Height: 5' (152.4 cm) Weight: 112 lb (50.803 kg) IBW/kg (Calculated) : 45.5   Vital Signs: Temp: 99.1 F (37.3 C) (12/02 0600) Temp src: Oral (12/02 0600) BP: 105/65 mmHg (12/02 0600) Pulse Rate: 91  (12/02 0600)  Labs:  Basename 12/29/10 0456 12/28/10 0440 12/27/10 0528  HGB 9.8* 9.7* --  HCT 28.7* 28.2* 30.8*  PLT 256 201 213  APTT -- -- --  LABPROT 28.0* 23.4* 18.0*  INR 2.57* 2.04* 1.46  HEPARINUNFRC -- -- --  CREATININE 0.63 0.54 0.62  CKTOTAL -- -- --  CKMB -- -- --  TROPONINI -- -- --   Estimated Creatinine Clearance: 49.7 ml/min (by C-G formula based on Cr of 0.63).  Medical History: Past Medical History  Diagnosis Date  . Hypertension   . 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 Vitamin B12 deficiency (dietary) anemia 12/27/2010   Medications:  Scheduled:     . antiseptic oral rinse  15 mL Mouth Rinse BID  . atenolol  50 mg Oral BID  . calcium-vitamin D  1 tablet Oral BID  . cyanocobalamin  1,000 mcg Intramuscular Daily  . docusate sodium  100 mg Oral BID  . enoxaparin  50 mg Subcutaneous Q12H  . furosemide  40 mg Oral Daily  . pantoprazole  40 mg Oral Q1200  . polysaccharide iron  150 mg Oral Daily  . potassium chloride  20 mEq Oral Daily  . sodium chloride  3 mL Intravenous Q12H  . warfarin  5 mg Oral ONCE-1800   Assessment: Day 5 of 5 overlap. Therapeutic INR x 2 days. H/H stable No bleeding reported.  Goal of Therapy:  INR 2-3  Full anticoagulation w/ Lovenox (5 day over-lap)   Plan: Lovenox 50mg  (1mg /kg) sq q12hrs - Discontinue Coumadin 5mg  today x 1 INR daily until stable Warfarin education done for both Mrs. Mccready and daughter previously.   Lamonte Richer R 12/29/2010,10:31 AM

## 2010-12-30 ENCOUNTER — Inpatient Hospital Stay
Admission: RE | Admit: 2010-12-30 | Discharge: 2011-01-01 | Disposition: A | Discharge status: Nursing Home (Includes ICF) | Payer: Medicare Other | Source: Ambulatory Visit | Attending: Internal Medicine | Admitting: Internal Medicine

## 2010-12-30 DIAGNOSIS — I82409 Acute embolism and thrombosis of unspecified deep veins of unspecified lower extremity: Secondary | ICD-10-CM

## 2010-12-30 DIAGNOSIS — R760 Raised antibody titer: Secondary | ICD-10-CM | POA: Diagnosis present

## 2010-12-30 DIAGNOSIS — R52 Pain, unspecified: Principal | ICD-10-CM

## 2010-12-30 DIAGNOSIS — R609 Edema, unspecified: Secondary | ICD-10-CM

## 2010-12-30 LAB — BETA-2-GLYCOPROTEIN I ABS, IGG/M/A
Beta-2 Glyco I IgG: 0 G Units (ref <20)
Beta-2-Glycoprotein I IgA: 3 A Units (ref <20)
Beta-2-Glycoprotein I IgM: 1 M Units (ref <20)

## 2010-12-30 LAB — PROTIME-INR: Prothrombin Time: 30.7 seconds — ABNORMAL HIGH (ref 11.6–15.2)

## 2010-12-30 MED ORDER — ACETAMINOPHEN 325 MG PO TABS: 650.0000 mg | ORAL_TABLET | Freq: Four times a day (QID) | ORAL | Status: AC | PRN

## 2010-12-30 MED ORDER — POLYSACCHARIDE IRON 150 MG PO CAPS: 150.0000 mg | ORAL_CAPSULE | Freq: Every day | ORAL | Status: DC

## 2010-12-30 MED ORDER — HYDROCODONE-ACETAMINOPHEN 5-325 MG PO TABS: 1.0000 | ORAL_TABLET | Freq: Four times a day (QID) | ORAL | Status: AC | PRN

## 2010-12-30 MED ORDER — DSS 100 MG PO CAPS: 100.0000 mg | ORAL_CAPSULE | Freq: Two times a day (BID) | ORAL | Status: AC

## 2010-12-30 MED ORDER — CYANOCOBALAMIN 1000 MCG/ML IJ SOLN: 1000.0000 ug | INTRAMUSCULAR | Status: DC

## 2010-12-30 MED ORDER — WARFARIN SODIUM 5 MG PO TABS: 5.0000 mg | ORAL_TABLET | Freq: Every day | ORAL | Status: DC

## 2010-12-30 MED ORDER — WARFARIN SODIUM 5 MG PO TABS: 5.0000 mg | ORAL_TABLET | Freq: Once | ORAL | Status: DC

## 2010-12-30 NOTE — Progress Notes (Signed)
Patient d/c to [facility]  Left floor via wheelchair no c/o pain at d/c Discharge packet sent with patient  Report called to Idell Pickles, Nurse Family aware of transfer Margi Edmundson, Kae Heller

## 2010-12-30 NOTE — Progress Notes (Signed)
Pt d/c today by MD to SNF.  CSW presented bed offers and pt and daughter choose [facility].  Facility notified.  Daughter to complete admission paperwork and pt will transfer with RN.    [name]

## 2010-12-30 NOTE — Consult Note (Signed)
ANTICOAGULATION CONSULT NOTE -   Pharmacy Consult for Warfarin Indication: VTE Treatment  No Known Allergies  Patient Measurements: Height: 5' (152.4 cm) Weight: 112 lb (50.803 kg) IBW/kg (Calculated) : 45.5   Vital Signs: Temp: 98.4 F (36.9 C) (12/03 0600) Temp src: Oral (12/03 0600) BP: 112/72 mmHg (12/03 0600) Pulse Rate: 90  (12/03 0600)  Labs:  Basename 12/30/10 0512 12/29/10 0456 12/28/10 0440  HGB -- 9.8* 9.7*  HCT -- 28.7* 28.2*  PLT -- 256 201  APTT -- -- --  LABPROT 30.7* 28.0* 23.4*  INR 2.89* 2.57* 2.04*  HEPARINUNFRC -- -- --  CREATININE -- 0.63 0.54  CKTOTAL -- -- --  CKMB -- -- --  TROPONINI -- -- --   Estimated Creatinine Clearance: 49.7 ml/min (by C-G formula based on Cr of 0.63).  Medical History: Past Medical History  Diagnosis Date  . Hypertension   . 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 Vitamin B12 deficiency (dietary) anemia 12/27/2010   Medications:  Scheduled:     . antiseptic oral rinse  15 mL Mouth Rinse BID  . atenolol  50 mg Oral BID  . calcium-vitamin D  1 tablet Oral BID  . cyanocobalamin  1,000 mcg Intramuscular Daily  . docusate sodium  100 mg Oral BID  . furosemide  40 mg Oral Daily  . pantoprazole  40 mg Oral Q1200  . polysaccharide iron  150 mg Oral Daily  . potassium chloride  20 mEq Oral Daily  . sodium chloride  3 mL Intravenous Q12H  . warfarin  5 mg Oral ONCE-1800  . DISCONTD: enoxaparin  50 mg Subcutaneous Q12H   Assessment: Day 5 of 5 overlap. Therapeutic INR x 2 days. H/H stable No bleeding reported.  Goal of Therapy:  INR 2-3  Full anticoagulation w/ Lovenox (5 day over-lap)   Plan: Coumadin 5mg  today x 1 INR daily until stable Warfarin education done for both Mrs. Mihalko and daughter previously.   Gilman Buttner, [place] J 12/30/2010,8:40 AM

## 2010-12-30 NOTE — Discharge Summary (Signed)
Physician Discharge Summary  Patient ID: [name] MRN: [id number] DOB/AGE: 67-Aug-1947 67 y.o.  Admit date: 12/24/2010 Discharge date: 12/30/2010  Discharge Diagnoses:   Principal Problem:  *Femoral DVT (deep venous thrombosis) Active Problems:  Lupus anticoagulant positive  HTN (hypertension)  Vitamin B12 deficiency (dietary) anemia, new diagnosis  Anemia  Hyponatremia  fever secondary to DVT  Current Discharge Medication List    START taking these medications   Details  acetaminophen (TYLENOL) 325 MG tablet Take 2 tablets (650 mg total) by mouth every 6 (six) hours as needed (or Fever >/= 101). Qty: 30 tablet    cyanocobalamin (,VITAMIN B-12,) 1000 MCG/ML injection Inject 1 mL (1,000 mcg total) into the muscle once a week. Qty: 1 mL    docusate sodium 100 MG CAPS Take 100 mg by mouth 2 (two) times daily. Qty: 10 capsule    HYDROcodone-acetaminophen (NORCO) 5-325 MG per tablet Take 1 tablet by mouth every 6 (six) hours as needed for pain. Qty: 20 tablet, Refills: 0    polysaccharide iron (NIFEREX) 150 MG CAPS capsule Take 1 capsule (150 mg total) by mouth daily. Qty: 30 each    warfarin (COUMADIN) 5 MG tablet Take 1 tablet (5 mg total) by mouth at bedtime.      CONTINUE these medications which have NOT CHANGED   Details  atenolol (TENORMIN) 50 MG tablet Take 50 mg by mouth 2 (two) times daily.      Calcium Carbonate-Vitamin D ([employer] CALCIUM 600 + D) 600-400 MG-UNIT per tablet Take 1 tablet by mouth 2 (two) times daily.      traZODone (DESYREL) 50 MG tablet Take 50 mg by mouth at bedtime.        STOP taking these medications     hydrochlorothiazide (HYDRODIURIL) 25 MG tablet         Discharge Orders    Future Orders Please Complete By Expires   Diet - low sodium heart healthy      Comments:   Warfarin compatible   Activity as tolerated - No restrictions         Follow-up Information    Follow up with MILES,LINDA M. (After discharge from skilled  nursing facility. Need repeat lupus anticoagulant assay in 3 months.)    Contact information:   [facility] [address] [place] [address] [phone]       Follow up with SNF provider in 2 days. (INR check in 2 days then at least weekly, or per SNF protocol. adjust Coumadin to keep INR between 2 and 3.)          Disposition: SNF  Discharged Condition: stable  Consults:  none  Labs:   Results for orders placed during the hospital encounter of 12/24/10 (from the past 48 hour(s))  PROTIME-INR     Status: Abnormal   Collection Time   12/29/10  4:56 AM      Component Value Range Comment   Prothrombin Time 28.0 (*) 11.6 - 15.2 (seconds)    INR 2.57 (*) 0.00 - 1.49    CBC     Status: Abnormal   Collection Time   12/29/10  4:56 AM      Component Value Range Comment   WBC 6.7  4.0 - 10.5 (K/uL)    RBC 3.50 (*) 3.87 - 5.11 (MIL/uL)    Hemoglobin 9.8 (*) 12.0 - 15.0 (g/dL)    HCT 21.3 (*) 08.6 - 46.0 (%)    MCV 82.0  78.0 -  100.0 (fL)    MCH 28.0  26.0 - 34.0 (pg)    MCHC 34.1  30.0 - 36.0 (g/dL)    RDW 40.9 (*) 81.1 - 15.5 (%)    Platelets 256  150 - 400 (K/uL)   BASIC METABOLIC PANEL     Status: Abnormal   Collection Time   12/29/10  4:56 AM      Component Value Range Comment   Sodium 132 (*) 135 - 145 (mEq/L)    Potassium 3.8  3.5 - 5.1 (mEq/L)    Chloride 95 (*) 96 - 112 (mEq/L)    CO2 29  19 - 32 (mEq/L)    Glucose, Bld 103 (*) 70 - 99 (mg/dL)    BUN 13  6 - 23 (mg/dL)    Creatinine, Ser 9.14  0.50 - 1.10 (mg/dL)    Calcium 8.9  8.4 - 10.5 (mg/dL)    GFR calc non Af Amer >90  >90 (mL/min)    GFR calc Af Amer >90  >90 (mL/min)   PROTIME-INR     Status: Abnormal   Collection Time   12/30/10  5:12 AM      Component Value Range Comment   Prothrombin Time 30.7 (*) 11.6 - 15.2 (seconds)    INR 2.89 (*) 0.00 - 1.49      Diagnostics:  Dg Chest 2 View  12/28/2010  *RADIOLOGY REPORT*  Clinical Data: Fevers, rule out pneumonia.   CHEST - 2 VIEW  Comparison: None  Findings: Two views of the chest were obtained.  The lungs are clear without airspace disease.  Prominent right hilar structures could be related to vessels but difficult to exclude lymphadenopathy, particularly on the lateral view. There is nodularity in the right hilar region on the lateral view.  Bony structures are intact.  IMPRESSION: Prominent right hilar structures as described.  An old comparison exam would be useful if available.  Otherwise, this area could be evaluated with a chest CT with IV contrast.  No focal airspace disease.  Original Report Authenticated By: Richarda Overlie, M.D.   US Venous Img Lower Bilateral  12/25/2010  *RADIOLOGY REPORT*  Clinical Data: Right leg pain and edema.  BILATERAL LOWER EXTREMITY VENOUS DOPPLER ULTRASOUND  Technique: Gray-scale sonography with compression, as well as color and duplex ultrasound, were performed to evaluate the deep venous system from the level of the common femoral vein through the popliteal and proximal calf veins.  Comparison: None  Findings: On the right, there is  occlusive noncompressible thrombus in the femoral vein extending through the popliteal vein. Profunda femoris and common femoral veins remain patent. Noncompressible peroneal veins are noted.  On the left, normal compressibility of  the common femoral, superficial femoral, and popliteal veins, as well as the proximal calf veins.  No filling defects to suggest DVT on grayscale or color Doppler imaging.  Doppler waveforms show normal direction of venous flow, normal respiratory phasicity and response to augmentation.  IMPRESSION: 1.  Occlusive right femoropopliteal DVT. 2.  No evidence of left lower extremity deep vein thrombosis.  Original Report Authenticated By: Osa Craver, M.D.    Procedures:  None  Full Code   Hospital Course: See H&P for complete admission details. The patient is a 66 roll black female who presented with a several week  history of worsening right leg pain and swelling. She was unable to bear weight. She was admitted to the hospitalist service. Doppler was positive for DVT, right femoral-popliteal vein. She had been  started on Lovenox and Coumadin, so could not get a full hypercoagulable workup. Protein C, protein S, antithrombin III was not done due to 2 likelihood of false positive. However, she did have mild positive lupus anticoagulant which should not be affected by anticoagulation. Patient required pain medication and physical therapy. She lives alone and is interested in short-term skilled nursing facility placement. She will need monitoring of her INR, and adjustment of her Coumadin as needed. She will need a repeat hypercoagulable workup in 3-6 months. She may need lifelong Coumadin.  She was noted to be anemic and hyponatremic on admission. She had been on hydrochlorothiazide prior to admission which was stopped. She had an anemia panel which was significant for a low cyanocobalamin level. This is a new diagnosis for her. She's received IM cyanocobalamin injections while here. She will need weekly injections for a month and then monthly injections. Her iron level was low, ferritin level was 300. She also was started on iron. She had no evidence of bleeding. Her other medical problems remained stable during the hospitalization. Total time on the day of discharge is greater than 30 minutes.  Discharge Exam:  Blood pressure 112/72, pulse 103, temperature 98.4 F (36.9 C), temperature source Oral, resp. rate 16, height 5' (1.524 m), weight 50.803 kg (112 lb), SpO2 94.00%.  General: Alert, oriented, comfortable Extremities right leg swollen and tender   Signed: Turhan Chill L 12/30/2010, 10:28 AM

## 2011-01-01 ENCOUNTER — Emergency Department (HOSPITAL_COMMUNITY): Payer: Medicare Other

## 2011-01-01 ENCOUNTER — Ambulatory Visit (HOSPITAL_COMMUNITY): Payer: Medicare Other

## 2011-01-01 ENCOUNTER — Inpatient Hospital Stay (HOSPITAL_COMMUNITY)
Admission: EM | Admit: 2011-01-01 | Discharge: 2011-02-07 | DRG: 003 | Disposition: A | Discharge status: Skilled Nursing Facility | Payer: Medicare Other | Attending: Pulmonary Disease | Admitting: Pulmonary Disease

## 2011-01-01 ENCOUNTER — Encounter (HOSPITAL_COMMUNITY): Payer: Self-pay | Admitting: General Practice

## 2011-01-01 DIAGNOSIS — I255 Ischemic cardiomyopathy: Secondary | ICD-10-CM

## 2011-01-01 DIAGNOSIS — E785 Hyperlipidemia, unspecified: Secondary | ICD-10-CM | POA: Diagnosis present

## 2011-01-01 DIAGNOSIS — I2510 Atherosclerotic heart disease of native coronary artery without angina pectoris: Secondary | ICD-10-CM | POA: Diagnosis not present

## 2011-01-01 DIAGNOSIS — E870 Hyperosmolality and hypernatremia: Secondary | ICD-10-CM | POA: Diagnosis not present

## 2011-01-01 DIAGNOSIS — S7290XA Unspecified fracture of unspecified femur, initial encounter for closed fracture: Secondary | ICD-10-CM

## 2011-01-01 DIAGNOSIS — Z86718 Personal history of other venous thrombosis and embolism: Secondary | ICD-10-CM

## 2011-01-01 DIAGNOSIS — J9690 Respiratory failure, unspecified, unspecified whether with hypoxia or hypercapnia: Secondary | ICD-10-CM

## 2011-01-01 DIAGNOSIS — I4729 Other ventricular tachycardia: Secondary | ICD-10-CM | POA: Diagnosis not present

## 2011-01-01 DIAGNOSIS — J690 Pneumonitis due to inhalation of food and vomit: Secondary | ICD-10-CM | POA: Diagnosis not present

## 2011-01-01 DIAGNOSIS — I214 Non-ST elevation (NSTEMI) myocardial infarction: Secondary | ICD-10-CM | POA: Diagnosis not present

## 2011-01-01 DIAGNOSIS — I4891 Unspecified atrial fibrillation: Secondary | ICD-10-CM

## 2011-01-01 DIAGNOSIS — G9340 Encephalopathy, unspecified: Secondary | ICD-10-CM | POA: Diagnosis not present

## 2011-01-01 DIAGNOSIS — E871 Hypo-osmolality and hyponatremia: Secondary | ICD-10-CM | POA: Diagnosis present

## 2011-01-01 DIAGNOSIS — I10 Essential (primary) hypertension: Secondary | ICD-10-CM | POA: Diagnosis present

## 2011-01-01 DIAGNOSIS — R760 Raised antibody titer: Secondary | ICD-10-CM

## 2011-01-01 DIAGNOSIS — E46 Unspecified protein-calorie malnutrition: Secondary | ICD-10-CM | POA: Diagnosis not present

## 2011-01-01 DIAGNOSIS — D649 Anemia, unspecified: Secondary | ICD-10-CM

## 2011-01-01 DIAGNOSIS — I82409 Acute embolism and thrombosis of unspecified deep veins of unspecified lower extremity: Secondary | ICD-10-CM

## 2011-01-01 DIAGNOSIS — D518 Other vitamin B12 deficiency anemias: Secondary | ICD-10-CM | POA: Diagnosis present

## 2011-01-01 DIAGNOSIS — W19XXXA Unspecified fall, initial encounter: Secondary | ICD-10-CM | POA: Diagnosis present

## 2011-01-01 DIAGNOSIS — I469 Cardiac arrest, cause unspecified: Secondary | ICD-10-CM | POA: Diagnosis not present

## 2011-01-01 DIAGNOSIS — I2589 Other forms of chronic ischemic heart disease: Secondary | ICD-10-CM | POA: Diagnosis present

## 2011-01-01 DIAGNOSIS — S7291XA Unspecified fracture of right femur, initial encounter for closed fracture: Secondary | ICD-10-CM | POA: Diagnosis present

## 2011-01-01 DIAGNOSIS — E876 Hypokalemia: Secondary | ICD-10-CM | POA: Diagnosis not present

## 2011-01-01 DIAGNOSIS — R570 Cardiogenic shock: Secondary | ICD-10-CM | POA: Diagnosis not present

## 2011-01-01 DIAGNOSIS — I219 Acute myocardial infarction, unspecified: Secondary | ICD-10-CM

## 2011-01-01 DIAGNOSIS — I82419 Acute embolism and thrombosis of unspecified femoral vein: Secondary | ICD-10-CM | POA: Diagnosis present

## 2011-01-01 DIAGNOSIS — J9600 Acute respiratory failure, unspecified whether with hypoxia or hypercapnia: Secondary | ICD-10-CM | POA: Diagnosis not present

## 2011-01-01 DIAGNOSIS — D62 Acute posthemorrhagic anemia: Secondary | ICD-10-CM | POA: Diagnosis not present

## 2011-01-01 DIAGNOSIS — D72829 Elevated white blood cell count, unspecified: Secondary | ICD-10-CM | POA: Diagnosis not present

## 2011-01-01 DIAGNOSIS — T884XXA Failed or difficult intubation, initial encounter: Secondary | ICD-10-CM

## 2011-01-01 DIAGNOSIS — I472 Ventricular tachycardia, unspecified: Secondary | ICD-10-CM

## 2011-01-01 DIAGNOSIS — S72409A Unspecified fracture of lower end of unspecified femur, initial encounter for closed fracture: Principal | ICD-10-CM | POA: Diagnosis present

## 2011-01-01 DIAGNOSIS — M810 Age-related osteoporosis without current pathological fracture: Secondary | ICD-10-CM | POA: Diagnosis present

## 2011-01-01 HISTORY — DX: Unspecified osteoarthritis, unspecified site: M19.90

## 2011-01-01 HISTORY — DX: Pneumonitis due to inhalation of food and vomit: J69.0

## 2011-01-01 HISTORY — DX: Ventricular tachycardia: I47.2

## 2011-01-01 HISTORY — DX: Acute myocardial infarction, unspecified: I21.9

## 2011-01-01 LAB — COMPREHENSIVE METABOLIC PANEL
ALT: 30 U/L (ref 0–35)
Alkaline Phosphatase: 87 U/L (ref 39–117)
CO2: 29 mEq/L (ref 19–32)
Calcium: 9.5 mg/dL (ref 8.4–10.5)
GFR calc Af Amer: >90 mL/min (ref >90)
GFR calc non Af Amer: >90 mL/min (ref >90)
Glucose, Bld: 111 mg/dL — ABNORMAL HIGH (ref 70–99)
Sodium: 131 mEq/L — ABNORMAL LOW (ref 135–145)

## 2011-01-01 LAB — DIFFERENTIAL
Basophils Relative: 0 % (ref 0–1)
Eosinophils Absolute: 0.2 10*3/uL (ref 0.0–0.7)
Monocytes Relative: 11 % (ref 3–12)
Neutrophils Relative %: 72 % (ref 43–77)

## 2011-01-01 LAB — CBC
Hemoglobin: 11.1 g/dL — ABNORMAL LOW (ref 12.0–15.0)
MCH: 27.5 pg (ref 26.0–34.0)
MCHC: 33.4 g/dL (ref 30.0–36.0)
Platelets: 406 10*3/uL — ABNORMAL HIGH (ref 150–400)
RDW: 15.9 % — ABNORMAL HIGH (ref 11.5–15.5)

## 2011-01-01 LAB — PROTIME-INR: INR: 4 — ABNORMAL HIGH (ref 0.00–1.49)

## 2011-01-01 NOTE — H&P (Signed)
PCP:   Leanna Sato, MD   Chief Complaint:  Progressive right lower extremity pain and swelling  HPI: [name] is a 65 year old female who was recently hospitalized with a newly diagnosed DVT last week from November 27 and discharged on December 3 which was 2 days ago to a nursing home for rehabilitation as she could not ambulate and take care of herself at home. Her daughter and twin sister or with her right now and they said she had a fall 2 days before her hospitalization which was not a severe fall because of the significance of the pain in her leg. She had been actually complaining of leg pain on the right side for over a week when she finally had a fall and then progressively started having swelling in the lower extremity. After approximately 2 days of swelling he presented to the ED on November 20 and she was diagnosed with an acute DVT was placed on Lovenox and Coumadin. It appears per her records during that hospitalization that her swelling had improved somewhat but the pain persisted and she still cannot bear weight. She was sent to nursing home and over the last 2 days she has had progressive worsening pain in the leg and the swelling and he is also getting worse now. She has obvious ecchymosis over the right lateral port of the thigh and her daughter and sister say that this is new and was not present during her previous hospitalization. They are not aware of any falls during her last hospitalization or while she was at the nursing home. She is now found to have a distal right femur fracture which is unclear as to when this took place. The ecchymosis on her right lateral thigh appears at least several days old. They also say that [name] has been having waxing and waning confusion which is also new since her recent illness. They do not know of any fevers nausea vomiting diarrhea any other pain besides the right leg pain. They've not observed any focal neurological changes except the  fact that she cannot ambulate because of the pain. We are transferring the patient to [facility] for further evaluation and to obtain a treatment plan for her to proceed to surgery in the setting of a newly diagnosed DVT in this extremity.  Review of Systems:  Otherwise negative per family members and unobtainable from the patient  Past Medical History: Past Medical History  Diagnosis Date  . Hypertension   . 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 Vitamin B12 deficiency (dietary) anemia 12/27/2010   Past Surgical History  Procedure Date  . Abdominal hysterectomy     06/2010    Medications: Prior to Admission medications   Medication Sig Start Date End Date Taking? Authorizing Provider  acetaminophen (TYLENOL) 325 MG tablet Take 2 tablets (650 mg total) by mouth every 6 (six) hours as needed (or Fever >/= 101). 12/30/10 01/09/11 Yes Corinna L Sullivan  atenolol (TENORMIN) 50 MG tablet Take 50 mg by mouth 2 (two) times daily.     Yes Historical Provider, MD  Calcium Carbonate-Vitamin D ([employer] CALCIUM 600 + D) 600-400 MG-UNIT per tablet Take 1 tablet by mouth 2 (two) times daily.     Yes Historical Provider, MD  cyanocobalamin (,VITAMIN B-12,) 1000 MCG/ML injection Inject 1 mL (1,000 mcg total) into the muscle once a week. 12/30/10 12/30/11 Yes Corinna L Sullivan  docusate sodium 100 MG CAPS Take 100 mg by mouth 2 (two) times daily. 12/30/10  01/09/11 Yes Corinna Burman Freestone  HYDROcodone-acetaminophen (NORCO) 5-325 MG per tablet Take 1 tablet by mouth every 6 (six) hours as needed for pain. 12/30/10 01/09/11 Yes Corinna L Sullivan  polysaccharide iron (NIFEREX) 150 MG CAPS capsule Take 1 capsule (150 mg total) by mouth daily. 12/30/10  Yes Corinna L Sullivan  traZODone (DESYREL) 50 MG tablet Take 50 mg by mouth at bedtime. For insomnia   Yes Historical Provider, MD  warfarin (COUMADIN) 5 MG tablet Take 1 tablet (5 mg total) by mouth at bedtime. 12/30/10 12/30/11 Yes Corinna L Lendell Caprice     Allergies:  No Known Allergies  Social History:  reports that she has never smoked. She has never used smokeless tobacco. She reports that she does not drink alcohol or use illicit drugs.  Family History: No family history on file.  Physical Exam: Filed Vitals:   01/01/11 1508 01/01/11 1740 01/01/11 2154  BP: 131/81 118/67 120/71  Pulse: 77 83 101  Temp: 98.1 F (36.7 C) 98 F (36.7 C) 99.8 F (37.7 C)  TempSrc: Oral Oral Oral  Resp: 18 17 18   Height: 5' (1.524 m)    Weight: 49.896 kg (110 lb)    SpO2: 98% 98% 96%   General appearance: alert, cooperative and no distress Resp: clear to auscultation bilaterally Cardio: regular rate and rhythm, S1, S2 normal, no murmur, click, rub or gallop GI: soft, non-tender; bowel sounds normal; no masses,  no organomegaly Extremities: edema rle 3+ edema and moderate swelling with intact pulses.  ecchymosis large on lateral rt thigh with some yellowish discoloration on periphery Pulses: 2+ and symmetric Skin: Skin color, texture, turgor normal. No rashes or lesions or as above Neurologic: Mental status: alertness: alert, orientation: date, person, place, affect: normal  Mildly confused at times. Cranial nerves: normal   Labs on Admission:   [facility] 01/01/11 1645  NA 131*  K 4.0  CL 92*  CO2 29  GLUCOSE 111*  BUN 17  CREATININE 0.58  CALCIUM 9.5  MG --  PHOS --    Basename 01/01/11 1645  AST 83*  ALT 30  ALKPHOS 87  BILITOT 0.8  PROT 6.7  ALBUMIN 2.3*    Basename 01/01/11 1645  WBC 7.6  NEUTROABS 5.5  HGB 11.1*  HCT 33.2*  MCV 82.2  PLT 406*    Radiological Exams on Admission: Dg Chest 2 View  12/28/2010  *RADIOLOGY REPORT*  Clinical Data: Fevers, rule out pneumonia.  CHEST - 2 VIEW  Comparison: None  Findings: Two views of the chest were obtained.  The lungs are clear without airspace disease.  Prominent right hilar structures could be related to vessels but difficult to exclude lymphadenopathy,  particularly on the lateral view. There is nodularity in the right hilar region on the lateral view.  Bony structures are intact.  IMPRESSION: Prominent right hilar structures as described.  An old comparison exam would be useful if available.  Otherwise, this area could be evaluated with a chest CT with IV contrast.  No focal airspace disease.  Original Report Authenticated By: Richarda Overlie, M.D.   Dg Femur Right  01/01/2011  *RADIOLOGY REPORT*  Clinical Data: Pain and swelling right thigh, deformity, patient states no known injury  RIGHT FEMUR - 2 VIEW  Comparison: None  Findings: Oblique distal right femoral diaphyseal fracture with apex posterior and lateral angulation. Lateral and posterior displacement noted as well. Hip and knee joint alignments appear grossly preserved. No additional fracture or dislocation identified. Visualized portion of right hemi  pelvis appears intact. Osseous demineralization.  IMPRESSION: Displaced angulated distal right femoral diaphyseal fracture.  Patient transported to Emergency Department at conclusion of exam.  Original Report Authenticated By: Lollie Marrow, M.D.   US Venous Img Lower Bilateral  12/25/2010  *RADIOLOGY REPORT*  Clinical Data: Right leg pain and edema.  BILATERAL LOWER EXTREMITY VENOUS DOPPLER ULTRASOUND  Technique: Gray-scale sonography with compression, as well as color and duplex ultrasound, were performed to evaluate the deep venous system from the level of the common femoral vein through the popliteal and proximal calf veins.  Comparison: None  Findings: On the right, there is  occlusive noncompressible thrombus in the femoral vein extending through the popliteal vein. Profunda femoris and common femoral veins remain patent. Noncompressible peroneal veins are noted.  On the left, normal compressibility of  the common femoral, superficial femoral, and popliteal veins, as well as the proximal calf veins.  No filling defects to suggest DVT on grayscale or  color Doppler imaging.  Doppler waveforms show normal direction of venous flow, normal respiratory phasicity and response to augmentation.  IMPRESSION: 1.  Occlusive right femoropopliteal DVT. 2.  No evidence of left lower extremity deep vein thrombosis.  Original Report Authenticated By: Osa Craver, M.D.   US Venous Img Lower Unilateral Right  01/01/2011  *RADIOLOGY REPORT*  Clinical Data: Right leg pain and swelling.  History of femur fracture.  RIGHT LOWER EXTREMITY VENOUS DUPLEX ULTRASOUND  Technique:  Gray-scale sonography with graded compression, as well as color Doppler and duplex ultrasound, were performed to evaluate the deep venous system of the lower extremity from the level of the common femoral vein through the popliteal and proximal calf veins. Spectral Doppler was utilized to evaluate flow at rest and with distal augmentation maneuvers.  Comparison:  12/25/2010.  Findings: The common femoral, profunda femoral and upper superficial femoral veins are patent and compressible.  There is clot in the mid superficial femoral vein with partial occlusion and complete occlusion in the distal SFA and popliteal veins. This appears stable.  IMPRESSION: Deep venous thrombosis in the superficial femoral and popliteal veins, unchanged since prior examination.  Original Report Authenticated By: P. Loralie Champagne, M.D.    Assessment/Plan Present on Admission:  65 year old female with newly diagnosed right DVT within the last week new on anti-coagulation for now presents with a distal right femur fracture.  .Femur fracture, right orthopedic surgery Dr. Ave Filter has already again notified of patient's being transferred to [facility] for further evaluation. I've also spoken to Dr. Ave Filter and he would like for the patient to be in Buck's traction with 5 pound weights on her right lower extremity overnight.  .Femoral DVT (deep venous thrombosis) we'll hold her Coumadin at this time her INR is 4 and  therapeutic I am not going to reverse her coagulopathy at this time as vitamin K will likely make her more hypercoagulable. I discussed with Dr. Ave Filter and he agrees the best plan of care at this point would be to let her Coumadin trend down on its own. He probably will not be up to take her to the ward until Friday so again we'll just repeat her INR in the morning and hold off on any vitamin K. Once her INR drops below 2 she will need dosing with Lovenox depending on when she goes to the OR and will need aggressive postoperative full anticoagulation with Lovenox.  .Vitamin B12 deficiency (dietary) anemia stable  .HTN (hypertension) stable  Provide IV morphine and Zofran  as needed.  Family is very upset at this point as they requested for the patient to be transferred to [facility] however [facility] no beds and the family was not told of this by the emergency department prior to my arrival to transfer her to [facility]. I have gotten the house supervisor involved to address the family's concerns. They have agreed for the transfer to [facility] since there are no [facility] beds available.   Mekhai Venuto A 01/01/2011, 10:08 PM

## 2011-01-01 NOTE — ED Provider Notes (Signed)
History   Scribed for Traci Lennert, MD, the patient was seen in room APA07/APA07 . This chart was scribed by [name].    CSN: [id number] Arrival date & time: 01/01/2011  3:06 PM   First MD Initiated Contact with Patient 01/01/11 1547     Chief Complaint  Patient presents with  . Right Leg Pain     (Consider location/radiation/quality/duration/timing/severity/associated sxs/prior treatment) The history is provided by the patient and a relative. No language interpreter was used.   [name] is a 65 y.o. female who presents to the Emergency Department complaining of right leg pain. Pt reports previously admitted 12/24/10 for blood clots and d/c to nursing home 2 days ago. Pt reports severe pain in right leg with associated swelling. Pt is unsure how she injured her leg- onset unclear. Pt's family member reports a fall 11/27 with constant pain, but no recent fall or traumatic injury since. Pt's family member reports increased swelling within the last 2 days.  [place]. PCP [name]   Past Medical History  Diagnosis Date  . Hypertension   . 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 Vitamin B12 deficiency (dietary) anemia 12/27/2010    Past Surgical History  Procedure Date  . Abdominal hysterectomy     06/2010    No family history on file.  History  Substance Use Topics  . Smoking status: Never Smoker   . Smokeless tobacco: Never Used  . Alcohol Use: No    OB History    Grav Para Term Preterm Abortions TAB SAB Ect Mult Living   1 1 1       1       Review of Systems  Constitutional: Negative for fever and fatigue.  HENT: Negative for congestion, sinus pressure and ear discharge.   Eyes: Negative for discharge.  Respiratory: Negative for cough.   Cardiovascular: Negative for chest pain.  Gastrointestinal: Negative for abdominal pain and diarrhea.  Genitourinary: Negative for frequency and hematuria.  Musculoskeletal: Negative for back pain.    Skin: Negative for rash.  Neurological: Negative for seizures and headaches.  Hematological: Negative.   Psychiatric/Behavioral: Negative for hallucinations.    Allergies  Review of patient's allergies indicates no known allergies.  Home Medications   Current Outpatient Rx  Name Route Sig Dispense Refill  . ACETAMINOPHEN 325 MG PO TABS Oral Take 2 tablets (650 mg total) by mouth every 6 (six) hours as needed (or Fever >/= 101). 30 tablet   . ATENOLOL 50 MG PO TABS Oral Take 50 mg by mouth 2 (two) times daily.      Marland Kitchen CALCIUM CARBONATE-VITAMIN D 600-400 MG-UNIT PO TABS Oral Take 1 tablet by mouth 2 (two) times daily.      . CYANOCOBALAMIN 1000 MCG/ML IJ SOLN Intramuscular Inject 1 mL (1,000 mcg total) into the muscle once a week. 1 mL     Weekly for 4 weeks, then monthly  . DSS 100 MG PO CAPS Oral Take 100 mg by mouth 2 (two) times daily. 10 capsule   . HYDROCODONE-ACETAMINOPHEN 5-325 MG PO TABS Oral Take 1 tablet by mouth every 6 (six) hours as needed for pain. 20 tablet 0  . POLYSACCHARIDE IRON 150 MG PO CAPS Oral Take 1 capsule (150 mg total) by mouth daily. 30 each   . TRAZODONE HCL 50 MG PO TABS Oral Take 50 mg by mouth at bedtime. For insomnia    . WARFARIN SODIUM 5 MG PO TABS Oral Take 1 tablet (5 mg  total) by mouth at bedtime.      Provider to adjust to keep INR between 2 and 3.    BP 131/81  Pulse 77  Temp(Src) 98.1 F (36.7 C) (Oral)  Resp 18  Ht 5' (1.524 m)  Wt 110 lb (49.896 kg)  BMI 21.48 kg/m2  SpO2 98%  Physical Exam  Nursing note and vitals reviewed. Constitutional: She is oriented to person, place, and time. She appears well-developed and well-nourished.  HENT:  Head: Normocephalic and atraumatic.  Eyes: Conjunctivae and EOM are normal. No scleral icterus.  Neck: Neck supple. No thyromegaly present.  Cardiovascular: Normal rate and regular rhythm.  Exam reveals no gallop and no friction rub.   No murmur heard. Pulmonary/Chest: Effort normal and breath  sounds normal. No stridor. She has no wheezes. She has no rales. She exhibits no tenderness.  Abdominal: She exhibits no distension. There is no tenderness. There is no rebound.  Musculoskeletal: Normal range of motion. She exhibits edema.       Severe edema with tenderness noted on right leg Neurovascularly intact  No ecchymosis noted on right leg   Lymphadenopathy:    She has no cervical adenopathy.  Neurological: She is alert and oriented to person, place, and time. Coordination normal.  Skin: Skin is warm and dry. No rash noted. No erythema.  Psychiatric: She has a normal mood and affect. Her behavior is normal.    ED Course  Procedures (including critical care time) DIAGNOSTIC STUDIES: Oxygen Saturation is 98% on RA, normal by my interpretation.    COORDINATION OF CARE: 4:00pm Pt informed of femoral fracture and course of care while in the ED with plans for admission  4:30pm MD consulted with Traci Apple MD orthopedic surgeon and Traci Apple MD requested a venous US   6:38pm Pt informed of unresolved blood clot from previous dx. Pt and family informed of the need to go to surgery possibly at [facility].    Results for orders placed during the hospital encounter of 01/01/11  CBC      Component Value Range   WBC 7.6  4.0 - 10.5 (K/uL)   RBC 4.04  3.87 - 5.11 (MIL/uL)   Hemoglobin 11.1 (*) 12.0 - 15.0 (g/dL)   HCT 16.1 (*) 09.6 - 46.0 (%)   MCV 82.2  78.0 - 100.0 (fL)   MCH 27.5  26.0 - 34.0 (pg)   MCHC 33.4  30.0 - 36.0 (g/dL)   RDW 04.5 (*) 40.9 - 15.5 (%)   Platelets 406 (*) 150 - 400 (K/uL)  DIFFERENTIAL      Component Value Range   Neutrophils Relative 72  43 - 77 (%)   Neutro Abs 5.5  1.7 - 7.7 (K/uL)   Lymphocytes Relative 15  12 - 46 (%)   Lymphs Abs 1.1  0.7 - 4.0 (K/uL)   Monocytes Relative 11  3 - 12 (%)   Monocytes Absolute 0.8  0.1 - 1.0 (K/uL)   Eosinophils Relative 2  0 - 5 (%)   Eosinophils Absolute 0.2  0.0 - 0.7 (K/uL)   Basophils Relative 0  0 - 1 (%)    Basophils Absolute 0.0  0.0 - 0.1 (K/uL)  PROTIME-INR      Component Value Range   Prothrombin Time 39.6 (*) 11.6 - 15.2 (seconds)   INR 4.00 (*) 0.00 - 1.49   TYPE AND SCREEN      Component Value Range   ABO/RH(D) O NEG     Antibody Screen NEG  Sample Expiration 01/04/2011    COMPREHENSIVE METABOLIC PANEL      Component Value Range   Sodium 131 (*) 135 - 145 (mEq/L)   Potassium 4.0  3.5 - 5.1 (mEq/L)   Chloride 92 (*) 96 - 112 (mEq/L)   CO2 29  19 - 32 (mEq/L)   Glucose, Bld 111 (*) 70 - 99 (mg/dL)   BUN 17  6 - 23 (mg/dL)   Creatinine, Ser 7.82  0.50 - 1.10 (mg/dL)   Calcium 9.5  8.4 - 95.6 (mg/dL)   Total Protein 6.7  6.0 - 8.3 (g/dL)   Albumin 2.3 (*) 3.5 - 5.2 (g/dL)   AST 83 (*) 0 - 37 (U/L)   ALT 30  0 - 35 (U/L)   Alkaline Phosphatase 87  39 - 117 (U/L)   Total Bilirubin 0.8  0.3 - 1.2 (mg/dL)   GFR calc non Af Amer >90  >90 (mL/min)   GFR calc Af Amer >90  >90 (mL/min)     Dg Femur Right  01/01/2011  *RADIOLOGY REPORT*  Clinical Data: Pain and swelling right thigh, deformity, patient states no known injury  RIGHT FEMUR - 2 VIEW  Comparison: None  Findings: Oblique distal right femoral diaphyseal fracture with apex posterior and lateral angulation. Lateral and posterior displacement noted as well. Hip and knee joint alignments appear grossly preserved. No additional fracture or dislocation identified. Visualized portion of right hemi pelvis appears intact. Osseous demineralization.  IMPRESSION: Displaced angulated distal right femoral diaphyseal fracture.  Patient transported to Emergency Department at conclusion of exam.  Original Report Authenticated By: Lollie Marrow, M.D.   US Venous Img Lower Unilateral Right  01/01/2011  *RADIOLOGY REPORT*  Clinical Data: Right leg pain and swelling.  History of femur fracture.  RIGHT LOWER EXTREMITY VENOUS DUPLEX ULTRASOUND  Technique:  Gray-scale sonography with graded compression, as well as color Doppler and duplex ultrasound,  were performed to evaluate the deep venous system of the lower extremity from the level of the common femoral vein through the popliteal and proximal calf veins. Spectral Doppler was utilized to evaluate flow at rest and with distal augmentation maneuvers.  Comparison:  12/25/2010.  Findings: The common femoral, profunda femoral and upper superficial femoral veins are patent and compressible.  There is clot in the mid superficial femoral vein with partial occlusion and complete occlusion in the distal SFA and popliteal veins. This appears stable.  IMPRESSION: Deep venous thrombosis in the superficial femoral and popliteal veins, unchanged since prior examination.  Original Report Authenticated By: P. Loralie Champagne, M.D.     1. Femur fracture   2. DVT (deep venous thrombosis)       MDM    Dr Ave Filter ortho to accept pt as cosult and dr. Mikeal Hawthorne to accept medically    The chart was scribed for me under my direct supervision.  I personally performed the history, physical, and medical decision making and all procedures in the evaluation of this patient.Traci Lennert, MD 01/01/11 2055

## 2011-01-01 NOTE — ED Notes (Signed)
Received report from [facility] nursing cntr. Pt was seen in ed on 11/27 d/t rt leg pain. Pt was found to have a rt femoral popliteal DVT and admitted to hospital. Pt was then admitted to [facility] c[facility]tr on 12/3. Per [facility] pt and family have denies injury to pt. Prior to admission pt was independent with all adl's and lived at home alone. Family did tell [facility] staff that pt had been complaining of pain and swelling to right leg for over a week.

## 2011-01-01 NOTE — ED Notes (Signed)
Pt states she came to the [facility] yesterday from [facility] as inpatient.  Pt states "[facility] sent me here to have Xrays of my Right leg and to have it looked at because it has got worse".  Pt with complaints of R hip pain 10/10.  Pt states her leg has been bothering her for the past three weeks.  Pt states she is unable to bear weight on her right leg as of 12/31/10.

## 2011-01-01 NOTE — ED Notes (Signed)
PIV inserted with positive blood return.  Only 2 ml aspirated from IV unable to obtain lab specimen.  Phlebotomy to draw lab work.  Pt tolerated well.

## 2011-01-02 ENCOUNTER — Encounter (HOSPITAL_COMMUNITY): Payer: Self-pay | Admitting: *Deleted

## 2011-01-02 ENCOUNTER — Other Ambulatory Visit: Payer: Self-pay

## 2011-01-02 DIAGNOSIS — M810 Age-related osteoporosis without current pathological fracture: Secondary | ICD-10-CM | POA: Diagnosis present

## 2011-01-02 LAB — BASIC METABOLIC PANEL
CO2: 26 mEq/L (ref 19–32)
Calcium: 8.5 mg/dL (ref 8.4–10.5)
GFR calc non Af Amer: >90 mL/min (ref >90)
Potassium: 3.7 mEq/L (ref 3.5–5.1)
Sodium: 136 mEq/L (ref 135–145)

## 2011-01-02 LAB — CBC
Hemoglobin: 9.4 g/dL — ABNORMAL LOW (ref 12.0–15.0)
Platelets: 388 10*3/uL (ref 150–400)
RBC: 3.44 MIL/uL — ABNORMAL LOW (ref 3.87–5.11)
WBC: 6.8 10*3/uL (ref 4.0–10.5)

## 2011-01-02 LAB — CULTURE, BLOOD (ROUTINE X 2): Culture: NO GROWTH

## 2011-01-02 LAB — PROTIME-INR
INR: 1.55 — ABNORMAL HIGH (ref 0.00–1.49)
Prothrombin Time: 33.1 seconds — ABNORMAL HIGH (ref 11.6–15.2)
Prothrombin Time: 18.9 seconds — ABNORMAL HIGH (ref 11.6–15.2)

## 2011-01-02 MED ORDER — VITAMIN K1 10 MG/ML IJ SOLN
5.0000 mg | Freq: Once | INTRAVENOUS | Status: AC
  Administered 2011-01-02: 5 mg via INTRAVENOUS
  Filled 2011-01-02: qty 0.5

## 2011-01-02 MED ORDER — POLYSACCHARIDE IRON 150 MG PO CAPS
150.0000 mg | ORAL_CAPSULE | Freq: Every day | ORAL | Status: DC
  Administered 2011-01-02 – 2011-01-03 (×2): 150 mg via ORAL
  Filled 2011-01-02 (×3): qty 1

## 2011-01-02 MED ORDER — ONDANSETRON HCL 4 MG PO TABS: 4.0000 mg | ORAL_TABLET | Freq: Four times a day (QID) | ORAL | Status: DC | PRN

## 2011-01-02 MED ORDER — MORPHINE SULFATE 2 MG/ML IJ SOLN: 1.0000 mg | INTRAMUSCULAR | Status: DC | PRN

## 2011-01-02 MED ORDER — ONDANSETRON HCL 4 MG/2ML IJ SOLN: 4.0000 mg | Freq: Four times a day (QID) | INTRAMUSCULAR | Status: DC | PRN

## 2011-01-02 MED ORDER — HEPARIN SOD (PORCINE) IN D5W 100 UNIT/ML IV SOLN
900.0000 [IU]/h | INTRAVENOUS | Status: AC
  Administered 2011-01-02: 750 [IU]/h via INTRAVENOUS
  Administered 2011-01-03: 900 [IU]/h via INTRAVENOUS
  Filled 2011-01-02: qty 250

## 2011-01-02 MED ORDER — CEFAZOLIN SODIUM 1-5 GM-% IV SOLN
1.0000 g | Freq: Once | INTRAVENOUS | Status: AC
  Administered 2011-01-03: 1 g via INTRAVENOUS
  Filled 2011-01-02: qty 50

## 2011-01-02 MED ORDER — CYANOCOBALAMIN 1000 MCG/ML IJ SOLN
1000.0000 ug | INTRAMUSCULAR | Status: DC
  Administered 2011-01-08 – 2011-01-22 (×3): 1000 ug via INTRAMUSCULAR
  Filled 2011-01-02 (×4): qty 1

## 2011-01-02 MED ORDER — DOCUSATE SODIUM 100 MG PO CAPS
100.0000 mg | ORAL_CAPSULE | Freq: Two times a day (BID) | ORAL | Status: DC
  Administered 2011-01-02 (×2): 100 mg via ORAL
  Filled 2011-01-02 (×6): qty 1

## 2011-01-02 MED ORDER — CALCIUM CARBONATE-VITAMIN D 500-200 MG-UNIT PO TABS
1.0000 | ORAL_TABLET | Freq: Two times a day (BID) | ORAL | Status: DC
  Administered 2011-01-02 (×2): 1 via ORAL
  Filled 2011-01-02 (×6): qty 1

## 2011-01-02 MED ORDER — TRAZODONE HCL 50 MG PO TABS
50.0000 mg | ORAL_TABLET | Freq: Every day | ORAL | Status: DC
  Administered 2011-01-02: 50 mg via ORAL
  Filled 2011-01-02 (×3): qty 1

## 2011-01-02 MED ORDER — KCL IN DEXTROSE-NACL 20-5-0.9 MEQ/L-%-% IV SOLN
INTRAVENOUS | Status: AC
  Administered 2011-01-02: 02:00:00 via INTRAVENOUS
  Filled 2011-01-02: qty 1000

## 2011-01-02 MED ORDER — ACETAMINOPHEN 325 MG PO TABS
650.0000 mg | ORAL_TABLET | Freq: Four times a day (QID) | ORAL | Status: DC | PRN
  Administered 2011-01-02 – 2011-01-09 (×2): 650 mg via ORAL
  Filled 2011-01-02 (×3): qty 2

## 2011-01-02 MED ORDER — ATENOLOL 50 MG PO TABS
50.0000 mg | ORAL_TABLET | Freq: Two times a day (BID) | ORAL | Status: DC
  Administered 2011-01-02: 50 mg via ORAL
  Filled 2011-01-02 (×8): qty 1

## 2011-01-02 NOTE — Progress Notes (Signed)
Patient's dtr Neysa Bonito) called CSW and stated that they did not place a bed hold at [facility]. Dtr is receptive to looking at other SNFs in [facility]. CSW faxed out patient and emailed dtr a list of SNF. CSW will continue to follow. [place], [place] [phone]

## 2011-01-02 NOTE — Progress Notes (Signed)
Called MD on call to ask about the Heparin drip that was ordered to be stopped at 0001 on 01/03/11. Needed clarification about when to stop Heparin. MD gave order to stop heparin at 0530 on 01/03/11.

## 2011-01-02 NOTE — Progress Notes (Signed)
Dr. Isidoro Donning informed me that INR has been normalized today and patient is ready for surgery when able.  I spoke to Dr. Carola Frost today who will graciously be able to take care of the patient tomorrow.  He will see her tonight or tomorrow morning and plan will be for IMN.  Her heparin will be stopped in the am. I spoke with the patient about this.  Dr. Magdalene Patricia expertise is greatly appreciated and I will follow along. Ave Filter

## 2011-01-02 NOTE — Consult Note (Signed)
Reason for Consult:R femur fracture  Referring Physician: Philomena Buttermore is an 65 y.o. female.  HPI: [name], R leg buckled walking up stairs 9 days ago, was seen at [facility] ED where she was dx with a DVT, placed on coumadin and placed in SNF. SNF sent back to [facility] yest due to continued severe 10/10 pain and swelling in leg.  She has not been able to WB since initially injury 9 days ago.  XR yest revealed R femur fx.  Ortho on call did not feel comfortable taking care of and she was transferred to [facility].  Pain is better in Buck's traction.  No distal N/T.     Past Medical History  Diagnosis Date  . Hypertension   . 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 Vitamin B12 deficiency (dietary) anemia 12/27/2010  . Arthritis     Past Surgical History  Procedure Date  . Abdominal hysterectomy     06/2010    History reviewed. No pertinent family history.  Social History:  reports that she has never smoked. She has never used smokeless tobacco. She reports that she does not drink alcohol or use illicit drugs.  Allergies: No Known Allergies  Medications: I have reviewed the patient's current medications.  Results for orders placed during the hospital encounter of 01/01/11 (from the past 48 hour(s))  CBC     Status: Abnormal   Collection Time   01/01/11  4:45 PM      Component Value Range Comment   WBC 7.6  4.0 - 10.5 (K/uL)    RBC 4.04  3.87 - 5.11 (MIL/uL)    Hemoglobin 11.1 (*) 12.0 - 15.0 (g/dL)    HCT 16.1 (*) 09.6 - 46.0 (%)    MCV 82.2  78.0 - 100.0 (fL)    MCH 27.5  26.0 - 34.0 (pg)    MCHC 33.4  30.0 - 36.0 (g/dL)    RDW 04.5 (*) 40.9 - 15.5 (%)    Platelets 406 (*) 150 - 400 (K/uL)   DIFFERENTIAL     Status: Normal   Collection Time   01/01/11  4:45 PM      Component Value Range Comment   Neutrophils Relative 72  43 - 77 (%)    Neutro Abs 5.5  1.7 - 7.7 (K/uL)    Lymphocytes Relative 15  12 - 46 (%)    Lymphs Abs 1.1  0.7 - 4.0 (K/uL)    Monocytes  Relative 11  3 - 12 (%)    Monocytes Absolute 0.8  0.1 - 1.0 (K/uL)    Eosinophils Relative 2  0 - 5 (%)    Eosinophils Absolute 0.2  0.0 - 0.7 (K/uL)    Basophils Relative 0  0 - 1 (%)    Basophils Absolute 0.0  0.0 - 0.1 (K/uL)   PROTIME-INR     Status: Abnormal   Collection Time   01/01/11  4:45 PM      Component Value Range Comment   Prothrombin Time 39.6 (*) 11.6 - 15.2 (seconds)    INR 4.00 (*) 0.00 - 1.49    TYPE AND SCREEN     Status: Normal   Collection Time   01/01/11  4:45 PM      Component Value Range Comment   ABO/RH(D) O NEG      Antibody Screen NEG      Sample Expiration 01/04/2011     COMPREHENSIVE METABOLIC PANEL     Status: Abnormal  Collection Time   01/01/11  4:45 PM      Component Value Range Comment   Sodium 131 (*) 135 - 145 (mEq/L)    Potassium 4.0  3.5 - 5.1 (mEq/L)    Chloride 92 (*) 96 - 112 (mEq/L)    CO2 29  19 - 32 (mEq/L)    Glucose, Bld 111 (*) 70 - 99 (mg/dL)    BUN 17  6 - 23 (mg/dL)    Creatinine, Ser 9.60  0.50 - 1.10 (mg/dL)    Calcium 9.5  8.4 - 10.5 (mg/dL)    Total Protein 6.7  6.0 - 8.3 (g/dL)    Albumin 2.3 (*) 3.5 - 5.2 (g/dL)    AST 83 (*) 0 - 37 (U/L)    ALT 30  0 - 35 (U/L)    Alkaline Phosphatase 87  39 - 117 (U/L)    Total Bilirubin 0.8  0.3 - 1.2 (mg/dL)    GFR calc non Af Amer >90  >90 (mL/min)    GFR calc Af Amer >90  >90 (mL/min)     Dg Chest 1 View  01/01/2011  *RADIOLOGY REPORT*  Clinical Data: Preoperative chest radiograph.  CHEST - 1 VIEW  Comparison: Chest radiograph performed 04/28/2010  Findings: The lungs are well-aerated and clear.  There is no evidence of focal opacification, pleural effusion or pneumothorax. Previously noted right hilar prominence has decreased in size, reflecting apparently normal vasculature.  The cardiomediastinal silhouette is within normal limits.  No acute osseous abnormalities are seen.  IMPRESSION: No acute cardiopulmonary process seen.  Original Report Authenticated By: Tonia Ghent, M.D.    Dg Femur Right  01/01/2011  *RADIOLOGY REPORT*  Clinical Data: Pain and swelling right thigh, deformity, patient states no known injury  RIGHT FEMUR - 2 VIEW  Comparison: None  Findings: Oblique distal right femoral diaphyseal fracture with apex posterior and lateral angulation. Lateral and posterior displacement noted as well. Hip and knee joint alignments appear grossly preserved. No additional fracture or dislocation identified. Visualized portion of right hemi pelvis appears intact. Osseous demineralization.  IMPRESSION: Displaced angulated distal right femoral diaphyseal fracture.  Patient transported to Emergency Department at conclusion of exam.  Original Report Authenticated By: Lollie Marrow, M.D.   US Venous Img Lower Unilateral Right  01/01/2011  *RADIOLOGY REPORT*  Clinical Data: Right leg pain and swelling.  History of femur fracture.  RIGHT LOWER EXTREMITY VENOUS DUPLEX ULTRASOUND  Technique:  Gray-scale sonography with graded compression, as well as color Doppler and duplex ultrasound, were performed to evaluate the deep venous system of the lower extremity from the level of the common femoral vein through the popliteal and proximal calf veins. Spectral Doppler was utilized to evaluate flow at rest and with distal augmentation maneuvers.  Comparison:  12/25/2010.  Findings: The common femoral, profunda femoral and upper superficial femoral veins are patent and compressible.  There is clot in the mid superficial femoral vein with partial occlusion and complete occlusion in the distal SFA and popliteal veins. This appears stable.  IMPRESSION: Deep venous thrombosis in the superficial femoral and popliteal veins, unchanged since prior examination.  Original Report Authenticated By: P. Loralie Champagne, M.D.    Review of Systems  All other systems reviewed and are negative.   Blood pressure 103/69, pulse 88, temperature 97.4 F (36.3 C), temperature source Oral, resp. rate 19, height 5'  (1.524 m), weight 49.896 kg (110 lb), SpO2 96.00%. Physical Exam  Constitutional: She is oriented to person, place, and  time. She appears well-developed and well-nourished.  HENT:  Head: Atraumatic.  Eyes: EOM are normal.  Neck: Neck supple.  Cardiovascular: Intact distal pulses.   Respiratory: Effort normal.  GI: Soft.  Musculoskeletal:       Right ankle: She exhibits normal pulse.       Legs: Neurological: She is alert and oriented to person, place, and time.  Skin: Skin is warm and dry.  Psychiatric: She has a normal mood and affect.    Assessment/Plan: R distal femur fracture, likely 1 wk old. INR supratherapeutic, will need to trend down prior to surgical fixation.  Spoke with primary med team who feels she should not be reversed with active DVT. Will plan on IM nail for fixation when safe Maintain traction until surgery.  Likely will place skeletal traction to try and restore/maintain length prior to surgery. Will use lovenox bridge post op and transition back to coumadin.  Mable Paris 01/02/2011, 9:24 AM

## 2011-01-02 NOTE — Progress Notes (Signed)
Utilization Review Completed.[name] T12/06/2010 Pt from nsg faciliy. Social work ref made.

## 2011-01-02 NOTE — Progress Notes (Signed)
Pt. Stated they did not feel comfortable signing the consent form for the procedure tomorrow. Pt feels like the doctor did not take enough time explaining the procedure and wants further clarification in the morning. Consent was not obtained.

## 2011-01-02 NOTE — Progress Notes (Signed)
ANTICOAGULATION CONSULT NOTE - Initial Consult  Pharmacy Consult for heparin Indication: DVT  No Known Allergies  Patient Measurements: Height: 5' (152.4 cm) Weight: 110 lb (49.896 kg) IBW/kg (Calculated) : 45.5  Adjusted Body Weight: 49kg  Vital Signs: Temp: 98 F (36.7 C) (12/06 1417) Temp src: Oral (12/06 1417) BP: 118/75 mmHg (12/06 1417) Pulse Rate: 99  (12/06 1417)  Labs:  Basename 01/02/11 1541 01/02/11 0900 01/01/11 1645  HGB -- 9.4* 11.1*  HCT -- 28.3* 33.2*  PLT -- 388 406*  APTT -- -- --  LABPROT 18.9* 33.1* 39.6*  INR 1.55* 3.18* 4.00*  HEPARINUNFRC -- -- --  CREATININE -- 0.47* 0.58  CKTOTAL -- -- --  CKMB -- -- --  TROPONINI -- -- --   Estimated Creatinine Clearance: 49.7 ml/min (by C-G formula based on Cr of 0.47).  Medical History: Past Medical History  Diagnosis Date  . Hypertension   . 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 Vitamin B12 deficiency (dietary) anemia 12/27/2010  . Arthritis     Medications:  Scheduled:    . atenolol  50 mg Oral BID  . calcium-vitamin D  1 tablet Oral BID  . cyanocobalamin  1,000 mcg Intramuscular Weekly  . docusate sodium  100 mg Oral BID  . phytonadione (VITAMIN K) IV  5 mg Intravenous Once  . polysaccharide iron  150 mg Oral Daily  . traZODone  50 mg Oral QHS   Infusions:    . dextrose 5 % and 0.9 % NaCl with KCl 20 mEq/L 75 mL/hr at 01/02/11 0500    Assessment: 64yo female with DVT will be started on heparin treatment.  Was previously on coumadin for this indication but admitting INR was high.  It's being held until after surgery per MD's note.  INR now 1.55 Goal of Therapy:  Heparin level 0.3-0.7 units/ml   Plan:  1) Heparin 750 units/hr (=7.32ml/hr) 2) Check a 6hr heparin level  3) Daily heparin level and CBC  Lenni Reckner, Tsz-Yin 01/02/2011,5:08 PM

## 2011-01-02 NOTE — Progress Notes (Signed)
CSW completed full assessment and placed in shadow chart with FL2. Patient is from [facility]. CSW will continue to follow. [place], [place] [phone]

## 2011-01-02 NOTE — Progress Notes (Signed)
Subjective: Patient seen and examined.  Chart reviewed.  Reports that her right leg pain is better controlled.  Spoke with the patient's daughter who reported that the patient had an episode where she "collapsed" on 12/31/2010.  Subsequently since then patient has had significant pain and not been able to participate with physical therapy.  The patient continued to have significant swelling and pain.  Imaging showed femur fracture.  Patient transferred to [place] from [facility] for further care and management.  Objective: Vital signs in last 24 hours: Filed Vitals:   01/01/11 1508 01/01/11 1740 01/01/11 2154 01/01/11 2351  BP: 131/81 118/67 120/71 111/73  Pulse: 77 83 101 84  Temp: 98.1 F (36.7 C) 98 F (36.7 C) 99.8 F (37.7 C) 99.4 F (37.4 C)  TempSrc: Oral Oral Oral Oral  Resp: 18 17 18 18   Height: 5' (1.524 m)     Weight: 49.896 kg (110 lb)     SpO2: 98% 98% 96% 95%   Weight change:  No intake or output data in the 24 hours ending 01/02/11 0212  Physical Exam: General: Awake, Oriented, No acute distress. HEENT: EOMI. Neck: Supple CV: S1 and S2 Lungs: Clear to ascultation bilaterally Abdomen: Soft, Nontender, Nondistended, +bowel sounds. Ext: Good pulses. Trace edema.  Right lower extremity in retraction, swelling right leg greater than left.  Lab Results:  [facility] 01/01/11 1645  NA 131*  K 4.0  CL 92*  CO2 29  GLUCOSE 111*  BUN 17  CREATININE 0.58  CALCIUM 9.5  MG --  PHOS --    Basename 01/01/11 1645  AST 83*  ALT 30  ALKPHOS 87  BILITOT 0.8  PROT 6.7  ALBUMIN 2.3*   No results found for this basename: LIPASE:2,AMYLASE:2 in the last 72 hours  Basename 01/01/11 1645  WBC 7.6  NEUTROABS 5.5  HGB 11.1*  HCT 33.2*  MCV 82.2  PLT 406*   No results found for this basename: CKTOTAL:3,CKMB:3,CKMBINDEX:3,TROPONINI:3 in the last 72 hours No results found for this basename: POCBNP:3 in the last 72 hours No results found for this basename: DDIMER:2  in the last 72 hours No results found for this basename: HGBA1C:2 in the last 72 hours No results found for this basename: CHOL:2,HDL:2,LDLCALC:2,TRIG:2,CHOLHDL:2,LDLDIRECT:2 in the last 72 hours No results found for this basename: TSH,T4TOTAL,FREET3,T3FREE,THYROIDAB in the last 72 hours No results found for this basename: VITAMINB12:2,FOLATE:2,FERRITIN:2,TIBC:2,IRON:2,RETICCTPCT:2 in the last 72 hours  Micro Results: Recent Results (from the past 240 hour(s))  URINE CULTURE     Status: Normal   Collection Time   12/27/10  3:43 PM      Component Value Range Status Comment   Specimen Description URINE, CATHETERIZED   Final    Special Requests NONE   Final    Setup Time [id number]   Final    Colony Count NO GROWTH   Final    Culture NO GROWTH   Final    Report Status 12/29/2010 FINAL   Final   CULTURE, BLOOD (ROUTINE X 2)     Status: Normal (Preliminary result)   Collection Time   12/28/10 12:27 AM      Component Value Range Status Comment   Specimen Description BLOOD RIGHT HAND   Final    Special Requests BOTTLES DRAWN AEROBIC AND ANAEROBIC 6CC   Final    Culture NO GROWTH 4 DAYS   Final    Report Status PENDING   Incomplete   CULTURE, BLOOD (ROUTINE X 2)     Status:  Normal (Preliminary result)   Collection Time   12/28/10 12:31 AM      Component Value Range Status Comment   Specimen Description BLOOD LEFT ARM   Final    Special Requests BOTTLES DRAWN AEROBIC AND ANAEROBIC 6CC   Final    Culture NO GROWTH 4 DAYS   Final    Report Status PENDING   Incomplete     Studies/Results: Dg Chest 1 View  01/01/2011  *RADIOLOGY REPORT*  Clinical Data: Preoperative chest radiograph.  CHEST - 1 VIEW  Comparison: Chest radiograph performed 04/28/2010  Findings: The lungs are well-aerated and clear.  There is no evidence of focal opacification, pleural effusion or pneumothorax. Previously noted right hilar prominence has decreased in size, reflecting apparently normal vasculature.  The  cardiomediastinal silhouette is within normal limits.  No acute osseous abnormalities are seen.  IMPRESSION: No acute cardiopulmonary process seen.  Original Report Authenticated By: Tonia Ghent, M.D.   Dg Femur Right  01/01/2011  *RADIOLOGY REPORT*  Clinical Data: Pain and swelling right thigh, deformity, patient states no known injury  RIGHT FEMUR - 2 VIEW  Comparison: None  Findings: Oblique distal right femoral diaphyseal fracture with apex posterior and lateral angulation. Lateral and posterior displacement noted as well. Hip and knee joint alignments appear grossly preserved. No additional fracture or dislocation identified. Visualized portion of right hemi pelvis appears intact. Osseous demineralization.  IMPRESSION: Displaced angulated distal right femoral diaphyseal fracture.  Patient transported to Emergency Department at conclusion of exam.  Original Report Authenticated By: Lollie Marrow, M.D.   US Venous Img Lower Unilateral Right  01/01/2011  *RADIOLOGY REPORT*  Clinical Data: Right leg pain and swelling.  History of femur fracture.  RIGHT LOWER EXTREMITY VENOUS DUPLEX ULTRASOUND  Technique:  Gray-scale sonography with graded compression, as well as color Doppler and duplex ultrasound, were performed to evaluate the deep venous system of the lower extremity from the level of the common femoral vein through the popliteal and proximal calf veins. Spectral Doppler was utilized to evaluate flow at rest and with distal augmentation maneuvers.  Comparison:  12/25/2010.  Findings: The common femoral, profunda femoral and upper superficial femoral veins are patent and compressible.  There is clot in the mid superficial femoral vein with partial occlusion and complete occlusion in the distal SFA and popliteal veins. This appears stable.  IMPRESSION: Deep venous thrombosis in the superficial femoral and popliteal veins, unchanged since prior examination.  Original Report Authenticated By: P. Loralie Champagne, M.D.    Medications: I have reviewed the patient's current medications. Scheduled Meds:   . atenolol  50 mg Oral BID  . Calcium Carbonate-Vitamin D  1 tablet Oral BID  . cyanocobalamin  1,000 mcg Intramuscular Weekly  . docusate sodium  100 mg Oral BID  . polysaccharide iron  150 mg Oral Daily  . traZODone  50 mg Oral QHS   Continuous Infusions:   . dextrose 5 % and 0.9 % NaCl with KCl 20 mEq/L 75 mL/hr at 01/02/11 0132   PRN Meds:.acetaminophen, morphine, ondansetron (ZOFRAN) IV, ondansetron  Assessment/Plan: Principal Problem:  *Femur fracture, right Active Problems:  HTN (hypertension)  Femoral DVT (deep venous thrombosis)  Hyponatremia  Vitamin B12 deficiency (dietary) anemia  Osteoporosis  Spoke with Dr. Ave Filter, orthopedic service, who will evaluate the patient in the morning with a right femur fracture.  As the patient's INR is supratherapeutic, given history of right femoral DVT, Coumadin has been held until the decision for surgery is made.  Suspect that the patient will need Lovenox or heparin once INR is below 2.0.  Suspect patient's hyponatremia is likely due to pain.  Continue the patient on supplemental iron for iron deficiency anemia based on the anemia panel done on 12/26/2010.  Continue vitamin B12 for B12 deficiency of 189 ([phone]) on 12/26/2010.  History of osteoporosis, continue calcium with vitamin D.  Continue atenolol for hypertension with hold parameters.  PT/INR, CBC,BMET ordered for AM.   LOS: 1 day  Marliyah Reid A, MD 01/02/2011, 2:12 AM

## 2011-01-02 NOTE — Progress Notes (Signed)
[name]  ZOX:[id number]  DOB: March 05, 1945  DOA: 01/01/2011  Subjective: Right leg pain is better controlled, still significant swelling, daughter at bedside  Objective: Weight change:   Intake/Output Summary (Last 24 hours) at 01/02/11 1047 Last data filed at 01/02/11 0700  Gross per 24 hour  Intake      0 ml  Output      0 ml  Net      0 ml   Blood pressure 103/69, pulse 88, temperature 97.4 F (36.3 C), temperature source Oral, resp. rate 19, height 5' (1.524 m), weight 49.896 kg (110 lb), SpO2 96.00%.  Physical Exam: General: Alert and awake, oriented,  not in any acute distress. HEENT: anicteric sclera, pupils reactive to light and accommodation, EOMI CVS: S1-S2 clear, no murmur rubs or gallops Chest: clear to auscultation bilaterally, no wheezing, rales or rhonchi Abdomen: soft nontender, nondistended, normal bowel sounds, no organomegaly Extremities: no cyanosis, clubbing, right lower extremity edema greater than left  Neuro: no focal neurological deficits  Lab Results: Basic Metabolic Panel:  Lab 01/02/11 8119 01/01/11 1645  NA 136 131*  K 3.7 4.0  CL 100 92*  CO2 26 29  GLUCOSE 104* 111*  BUN 13 17  CREATININE 0.47* 0.58  CALCIUM 8.5 9.5  MG -- --  PHOS -- --   Liver Function Tests:  Lab 01/01/11 1645  AST 83*  ALT 30  ALKPHOS 87  BILITOT 0.8  PROT 6.7  ALBUMIN 2.3*   CBC:  Lab 01/02/11 0900 01/01/11 1645  WBC 6.8 7.6  NEUTROABS -- 5.5  HGB 9.4* 11.1*  HCT 28.3* 33.2*  MCV 82.3 82.2  PLT 388 406*    Micro Results: Recent Results (from the past 240 hour(s))  URINE CULTURE     Status: Normal   Collection Time   12/27/10  3:43 PM      Component Value Range Status Comment   Specimen Description URINE, CATHETERIZED   Final    Special Requests NONE   Final    Setup Time [id number]   Final    Colony Count NO GROWTH   Final    Culture NO GROWTH   Final    Report Status 12/29/2010 FINAL   Final   CULTURE, BLOOD (ROUTINE X 2)      Status: Normal   Collection Time   12/28/10 12:27 AM      Component Value Range Status Comment   Specimen Description BLOOD RIGHT HAND   Final    Special Requests BOTTLES DRAWN AEROBIC AND ANAEROBIC 6CC   Final    Culture NO GROWTH 5 DAYS   Final    Report Status 01/02/2011 FINAL   Final   CULTURE, BLOOD (ROUTINE X 2)     Status: Normal   Collection Time   12/28/10 12:31 AM      Component Value Range Status Comment   Specimen Description BLOOD LEFT ARM   Final    Special Requests BOTTLES DRAWN AEROBIC AND ANAEROBIC [facility]   Final    Culture NO GROWTH 5 DAYS   Final    Report Status 01/02/2011 FINAL   Final     Studies/Results: Dg Chest 1 View  01/01/2011  *RADIOLOGY REPORT*  Clinical Data: Preoperative chest radiograph.  CHEST - 1 VIEW  Comparison: Chest radiograph performed 04/28/2010  Findings: The lungs are well-aerated and clear.  There is no evidence of focal opacification, pleural effusion or pneumothorax. Previously noted right hilar prominence has decreased in size, reflecting apparently normal  vasculature.  The cardiomediastinal silhouette is within normal limits.  No acute osseous abnormalities are seen.  IMPRESSION: No acute cardiopulmonary process seen.  Original Report Authenticated By: Tonia Ghent, M.D.   Dg Chest 2 View  12/28/2010  *RADIOLOGY REPORT*  Clinical Data: Fevers, rule out pneumonia.  CHEST - 2 VIEW  Comparison: None  Findings: Two views of the chest were obtained.  The lungs are clear without airspace disease.  Prominent right hilar structures could be related to vessels but difficult to exclude lymphadenopathy, particularly on the lateral view. There is nodularity in the right hilar region on the lateral view.  Bony structures are intact.  IMPRESSION: Prominent right hilar structures as described.  An old comparison exam would be useful if available.  Otherwise, this area could be evaluated with a chest CT with IV contrast.  No focal airspace disease.  Original Report  Authenticated By: Richarda Overlie, M.D.   Dg Femur Right  01/01/2011  *RADIOLOGY REPORT*  Clinical Data: Pain and swelling right thigh, deformity, patient states no known injury  RIGHT FEMUR - 2 VIEW  Comparison: None  Findings: Oblique distal right femoral diaphyseal fracture with apex posterior and lateral angulation. Lateral and posterior displacement noted as well. Hip and knee joint alignments appear grossly preserved. No additional fracture or dislocation identified. Visualized portion of right hemi pelvis appears intact. Osseous demineralization.  IMPRESSION: Displaced angulated distal right femoral diaphyseal fracture.  Patient transported to Emergency Department at conclusion of exam.  Original Report Authenticated By: Lollie Marrow, M.D.   US Venous Img Lower Bilateral  12/25/2010  *RADIOLOGY REPORT*  Clinical Data: Right leg pain and edema.  BILATERAL LOWER EXTREMITY VENOUS DOPPLER ULTRASOUND  Technique: Gray-scale sonography with compression, as well as color and duplex ultrasound, were performed to evaluate the deep venous system from the level of the common femoral vein through the popliteal and proximal calf veins.  Comparison: None  Findings: On the right, there is  occlusive noncompressible thrombus in the femoral vein extending through the popliteal vein. Profunda femoris and common femoral veins remain patent. Noncompressible peroneal veins are noted.  On the left, normal compressibility of  the common femoral, superficial femoral, and popliteal veins, as well as the proximal calf veins.  No filling defects to suggest DVT on grayscale or color Doppler imaging.  Doppler waveforms show normal direction of venous flow, normal respiratory phasicity and response to augmentation.  IMPRESSION: 1.  Occlusive right femoropopliteal DVT. 2.  No evidence of left lower extremity deep vein thrombosis.  Original Report Authenticated By: Osa Craver, M.D.   US Venous Img Lower Unilateral  Right  01/01/2011  *RADIOLOGY REPORT*  Clinical Data: Right leg pain and swelling.  History of femur fracture.  RIGHT LOWER EXTREMITY VENOUS DUPLEX ULTRASOUND  Technique:  Gray-scale sonography with graded compression, as well as color Doppler and duplex ultrasound, were performed to evaluate the deep venous system of the lower extremity from the level of the common femoral vein through the popliteal and proximal calf veins. Spectral Doppler was utilized to evaluate flow at rest and with distal augmentation maneuvers.  Comparison:  12/25/2010.  Findings: The common femoral, profunda femoral and upper superficial femoral veins are patent and compressible.  There is clot in the mid superficial femoral vein with partial occlusion and complete occlusion in the distal SFA and popliteal veins. This appears stable.  IMPRESSION: Deep venous thrombosis in the superficial femoral and popliteal veins, unchanged since prior examination.  Original Report Authenticated  By: P. Loralie Champagne, M.D.    Medications: Scheduled Meds:   . atenolol  50 mg Oral BID  . calcium-vitamin D  1 tablet Oral BID  . cyanocobalamin  1,000 mcg Intramuscular Weekly  . docusate sodium  100 mg Oral BID  . polysaccharide iron  150 mg Oral Daily  . traZODone  50 mg Oral QHS   Continuous Infusions:   . dextrose 5 % and 0.9 % NaCl with KCl 20 mEq/L 75 mL/hr at 01/02/11 0500     Assessment/Plan: Principal Problem:  *Femur fracture, right - Orthopedic service consulted and following the patient. INR is still supratherapeutic at 3.18, Coumadin on hold. The repeat Doppler ultrasound of the right ultimately was done which showed DVT in the superficial femoral and popliteal vein unchanged. Continue to hold the Coumadin, we can reverse Coumadin for the fracture repair, place on heparin drip until postop and then restart Coumadin after the surgery. Will discuss with Dr. Ave Filter to coordinate the surgery.   Active Problems:  HTN  (hypertension): Stable   Femoral DVT (deep venous thrombosis): INR supratherapeutic, Coumadin on hold.    Hyponatremia: Improved   Vitamin B12 deficiency (dietary) anemia: Continue placement   Osteoporosis: Fall precautions, calcium vitamin D supplementation, PT OT of the surgery when okayed by orthopedics. Patient will require skilled nursing facility for rehabilitation.    LOS: 1 day   [name] 01/02/2011, 10:47 AM

## 2011-01-03 ENCOUNTER — Inpatient Hospital Stay (HOSPITAL_COMMUNITY): Payer: Medicare Other

## 2011-01-03 ENCOUNTER — Other Ambulatory Visit: Payer: Self-pay

## 2011-01-03 ENCOUNTER — Encounter (HOSPITAL_COMMUNITY): Payer: Self-pay | Admitting: Anesthesiology

## 2011-01-03 ENCOUNTER — Encounter (HOSPITAL_COMMUNITY)
Admission: EM | Disposition: A | Discharge status: Skilled Nursing Facility | Payer: Self-pay | Source: Home / Self Care | Attending: Pulmonary Disease

## 2011-01-03 ENCOUNTER — Inpatient Hospital Stay (HOSPITAL_COMMUNITY): Payer: Medicare Other | Admitting: Anesthesiology

## 2011-01-03 ENCOUNTER — Ambulatory Visit (HOSPITAL_COMMUNITY): Admit: 2011-01-03 | Payer: Self-pay | Admitting: Interventional Cardiology

## 2011-01-03 DIAGNOSIS — I200 Unstable angina: Secondary | ICD-10-CM

## 2011-01-03 DIAGNOSIS — I2119 ST elevation (STEMI) myocardial infarction involving other coronary artery of inferior wall: Secondary | ICD-10-CM

## 2011-01-03 DIAGNOSIS — I469 Cardiac arrest, cause unspecified: Secondary | ICD-10-CM

## 2011-01-03 DIAGNOSIS — I4891 Unspecified atrial fibrillation: Secondary | ICD-10-CM

## 2011-01-03 HISTORY — PX: FEMUR IM NAIL: SHX1597

## 2011-01-03 HISTORY — PX: LEFT HEART CATHETERIZATION WITH CORONARY ANGIOGRAM: SHX5451

## 2011-01-03 LAB — BLOOD GAS, ARTERIAL
Bicarbonate: 21.3 mEq/L (ref 20.0–24.0)
O2 Saturation: 100 %
TCO2: 22.1 mmol/L (ref 0–100)
pO2, Arterial: 411 mmHg — ABNORMAL HIGH (ref 80.0–100.0)

## 2011-01-03 LAB — POCT I-STAT 3, ART BLOOD GAS (G3+)
pCO2 arterial: 31.1 mmHg — ABNORMAL LOW (ref 35.0–45.0)
pH, Arterial: 7.516 — ABNORMAL HIGH (ref 7.350–7.400)
pO2, Arterial: 344 mmHg — ABNORMAL HIGH (ref 80.0–100.0)

## 2011-01-03 LAB — PROTIME-INR
INR: 1.18 (ref 0.00–1.49)
Prothrombin Time: 15.3 seconds — ABNORMAL HIGH (ref 11.6–15.2)

## 2011-01-03 LAB — BASIC METABOLIC PANEL
BUN: 8 mg/dL (ref 6–23)
BUN: 12 mg/dL (ref 6–23)
CO2: 16 mEq/L — ABNORMAL LOW (ref 19–32)
Creatinine, Ser: 0.65 mg/dL (ref 0.50–1.10)
GFR calc Af Amer: >90 mL/min (ref >90)
GFR calc Af Amer: >90 mL/min (ref >90)
GFR calc non Af Amer: >90 mL/min (ref >90)
GFR calc non Af Amer: >90 mL/min (ref >90)
Glucose, Bld: 214 mg/dL — ABNORMAL HIGH (ref 70–99)
Potassium: 3.7 mEq/L (ref 3.5–5.1)
Potassium: 2.4 mEq/L — CL (ref 3.5–5.1)
Potassium: 3.1 mEq/L — ABNORMAL LOW (ref 3.5–5.1)
Sodium: 140 mEq/L (ref 135–145)
Sodium: 142 mEq/L (ref 135–145)

## 2011-01-03 LAB — PHOSPHORUS: Phosphorus: 4 mg/dL (ref 2.3–4.6)

## 2011-01-03 LAB — CBC
HCT: 18.2 % — ABNORMAL LOW (ref 36.0–46.0)
Hemoglobin: 5.9 g/dL — CL (ref 12.0–15.0)
Hemoglobin: 8.2 g/dL — ABNORMAL LOW (ref 12.0–15.0)
MCHC: 33.2 g/dL (ref 30.0–36.0)
MCHC: 32.2 g/dL (ref 30.0–36.0)
Platelets: 450 10*3/uL — ABNORMAL HIGH (ref 150–400)
Platelets: 432 10*3/uL — ABNORMAL HIGH (ref 150–400)
RBC: 2.16 MIL/uL — ABNORMAL LOW (ref 3.87–5.11)
RDW: 16.7 % — ABNORMAL HIGH (ref 11.5–15.5)
RDW: 17.2 % — ABNORMAL HIGH (ref 11.5–15.5)

## 2011-01-03 LAB — POCT I-STAT 4, (NA,K, GLUC, HGB,HCT)
HCT: 27 % — ABNORMAL LOW (ref 36.0–46.0)
Hemoglobin: 9.2 g/dL — ABNORMAL LOW (ref 12.0–15.0)
Potassium: >9 mEq/L (ref 3.5–5.1)
Sodium: 134 mEq/L — ABNORMAL LOW (ref 135–145)

## 2011-01-03 LAB — APTT: aPTT: 37 seconds (ref 24–37)

## 2011-01-03 LAB — MAGNESIUM: Magnesium: 4.4 mg/dL — ABNORMAL HIGH (ref 1.5–2.5)

## 2011-01-03 SURGERY — LEFT HEART CATHETERIZATION WITH CORONARY ANGIOGRAM
Anesthesia: LOCAL

## 2011-01-03 SURGERY — INSERTION, INTRAMEDULLARY ROD, FEMUR, RETROGRADE
Anesthesia: General | Site: Leg Upper | Laterality: Right | Wound class: Clean

## 2011-01-03 MED ORDER — HYDROMORPHONE HCL PF 1 MG/ML IJ SOLN
0.2500 mg | INTRAMUSCULAR | Status: DC | PRN
Start: 2011-01-03 — End: 2011-01-03
  Administered 2011-01-03 (×2): 0.25 mg via INTRAVENOUS

## 2011-01-03 MED ORDER — SODIUM CHLORIDE 0.9 % IV SOLN
250.0000 mL | INTRAVENOUS | Status: DC | PRN
  Administered 2011-01-10: 250 mL via INTRAVENOUS
  Administered 2011-01-11: 500 mL via INTRAVENOUS
  Administered 2011-01-12 – 2011-01-13 (×2): 250 mL via INTRAVENOUS
  Administered 2011-01-15: 16:00:00 via INTRAVENOUS

## 2011-01-03 MED ORDER — SODIUM CHLORIDE 0.9 % IV SOLN
INTRAVENOUS | Status: DC
  Administered 2011-01-03: 20:00:00 via INTRAVENOUS

## 2011-01-03 MED ORDER — METOCLOPRAMIDE HCL 5 MG/ML IJ SOLN
5.0000 mg | Freq: Three times a day (TID) | INTRAMUSCULAR | Status: DC | PRN
  Filled 2011-01-03: qty 2

## 2011-01-03 MED ORDER — SODIUM CHLORIDE 0.9 % IV SOLN
INTRAVENOUS | Status: DC
  Administered 2011-01-03: 18:00:00 via INTRAVENOUS

## 2011-01-03 MED ORDER — LORAZEPAM 2 MG/ML IJ SOLN: 2.0000 mg | INTRAMUSCULAR | Status: DC | PRN

## 2011-01-03 MED ORDER — DEXTROSE 5 % IV SOLN
30.0000 mg/h | INTRAVENOUS | Status: DC
  Administered 2011-01-04: 30.06 mg/h via INTRAVENOUS
  Administered 2011-01-05 – 2011-01-10 (×10): 30 mg/h via INTRAVENOUS
  Filled 2011-01-03 (×13): qty 9

## 2011-01-03 MED ORDER — WARFARIN SODIUM 7.5 MG PO TABS
7.5000 mg | ORAL_TABLET | Freq: Once | ORAL | Status: DC
  Filled 2011-01-03: qty 1

## 2011-01-03 MED ORDER — ASPIRIN 81 MG PO CHEW
CHEWABLE_TABLET | ORAL | Status: AC
  Filled 2011-01-03: qty 4

## 2011-01-03 MED ORDER — CEFAZOLIN SODIUM 1-5 GM-% IV SOLN
1.0000 g | Freq: Four times a day (QID) | INTRAVENOUS | Status: AC
  Administered 2011-01-03 – 2011-01-04 (×2): 1 g via INTRAVENOUS
  Filled 2011-01-03 (×4): qty 50

## 2011-01-03 MED ORDER — NEOSTIGMINE METHYLSULFATE 1 MG/ML IJ SOLN
INTRAMUSCULAR | Status: DC | PRN
  Administered 2011-01-03: 3 mg via INTRAVENOUS

## 2011-01-03 MED ORDER — HETASTARCH-ELECTROLYTES 6 % IV SOLN
INTRAVENOUS | Status: DC | PRN
  Administered 2011-01-03: 10:00:00 via INTRAVENOUS

## 2011-01-03 MED ORDER — PHENYLEPHRINE HCL 10 MG/ML IJ SOLN
20.0000 mg | INTRAVENOUS | Status: DC | PRN
  Administered 2011-01-03: 10 ug/min via INTRAVENOUS
  Administered 2011-01-03: 40 ug/min via INTRAVENOUS

## 2011-01-03 MED ORDER — HEPARIN (PORCINE) IN NACL 2-0.9 UNIT/ML-% IJ SOLN
INTRAMUSCULAR | Status: AC
  Filled 2011-01-03: qty 2000

## 2011-01-03 MED ORDER — ASPIRIN 300 MG RE SUPP
300.0000 mg | Freq: Once | RECTAL | Status: AC
  Administered 2011-01-03: 300 mg via RECTAL
  Filled 2011-01-03: qty 1

## 2011-01-03 MED ORDER — SODIUM CHLORIDE 0.9 % IV SOLN
1.0000 g | Freq: Once | INTRAVENOUS | Status: DC
  Filled 2011-01-03: qty 10

## 2011-01-03 MED ORDER — MIDAZOLAM HCL 2 MG/2ML IJ SOLN
INTRAMUSCULAR | Status: AC
  Administered 2011-01-04: 2 mg via INTRAVENOUS
  Filled 2011-01-03: qty 2

## 2011-01-03 MED ORDER — DEXTROSE 5 % IV SOLN
60.0000 mg/h | INTRAVENOUS | Status: DC
  Administered 2011-01-04: 60 mg/h via INTRAVENOUS
  Filled 2011-01-03 (×2): qty 9

## 2011-01-03 MED ORDER — MAGNESIUM SULFATE 50 % IJ SOLN: 1.0000 g | Freq: Once | INTRAMUSCULAR | Status: DC

## 2011-01-03 MED ORDER — HYDROMORPHONE HCL PF 1 MG/ML IJ SOLN
INTRAMUSCULAR | Status: AC
  Filled 2011-01-03: qty 1

## 2011-01-03 MED ORDER — LACTATED RINGERS IV SOLN
INTRAVENOUS | Status: DC | PRN
  Administered 2011-01-03 (×2): via INTRAVENOUS

## 2011-01-03 MED ORDER — FENTANYL CITRATE 0.05 MG/ML IJ SOLN
100.0000 ug | INTRAMUSCULAR | Status: DC | PRN
  Administered 2011-01-03 – 2011-01-05 (×9): 100 ug via INTRAVENOUS
  Filled 2011-01-03 (×9): qty 2

## 2011-01-03 MED ORDER — SODIUM CHLORIDE 0.9 % IR SOLN
Status: DC | PRN
  Administered 2011-01-03: 1000 mL

## 2011-01-03 MED ORDER — LORAZEPAM 2 MG/ML IJ SOLN
1.0000 mg | INTRAMUSCULAR | Status: DC | PRN
  Administered 2011-01-03: 23:00:00 via INTRAVENOUS
  Filled 2011-01-03: qty 1

## 2011-01-03 MED ORDER — MAGNESIUM HYDROXIDE 400 MG/5ML PO SUSP: 30.0000 mL | Freq: Every day | ORAL | Status: DC | PRN

## 2011-01-03 MED ORDER — PANTOPRAZOLE SODIUM 40 MG IV SOLR
40.0000 mg | Freq: Every day | INTRAVENOUS | Status: DC
  Administered 2011-01-04 – 2011-01-09 (×6): 40 mg via INTRAVENOUS
  Filled 2011-01-03 (×9): qty 40

## 2011-01-03 MED ORDER — ONDANSETRON HCL 4 MG/2ML IJ SOLN
INTRAMUSCULAR | Status: DC | PRN
  Administered 2011-01-03: 4 mg via INTRAVENOUS

## 2011-01-03 MED ORDER — MAGNESIUM SULFATE 40 MG/ML IJ SOLN
INTRAMUSCULAR | Status: AC
  Administered 2011-01-03: 4 g
  Filled 2011-01-03: qty 100

## 2011-01-03 MED ORDER — HYDROCODONE-ACETAMINOPHEN 5-325 MG PO TABS: 1.0000 | ORAL_TABLET | ORAL | Status: DC | PRN

## 2011-01-03 MED ORDER — BIVALIRUDIN 250 MG IV SOLR
INTRAVENOUS | Status: AC
  Filled 2011-01-03: qty 250

## 2011-01-03 MED ORDER — MIDAZOLAM HCL 2 MG/2ML IJ SOLN
1.0000 mg | INTRAMUSCULAR | Status: DC | PRN
  Administered 2011-01-04 – 2011-01-05 (×4): 2 mg via INTRAVENOUS
  Filled 2011-01-03 (×3): qty 2

## 2011-01-03 MED ORDER — ONDANSETRON HCL 4 MG/2ML IJ SOLN
4.0000 mg | Freq: Four times a day (QID) | INTRAMUSCULAR | Status: AC | PRN
  Administered 2011-01-03: 4 mg via INTRAVENOUS

## 2011-01-03 MED ORDER — DOPAMINE-DEXTROSE 3.2-5 MG/ML-% IV SOLN
INTRAVENOUS | Status: AC
  Administered 2011-01-03: 10 ug/kg/min via INTRAVENOUS
  Filled 2011-01-03: qty 250

## 2011-01-03 MED ORDER — POTASSIUM CHLORIDE 10 MEQ/50ML IV SOLN
INTRAVENOUS | Status: AC
  Administered 2011-01-03: 10 meq
  Filled 2011-01-03: qty 150

## 2011-01-03 MED ORDER — DEXTROSE 5 % IV SOLN
60.0000 mg/h | INTRAVENOUS | Status: AC
  Administered 2011-01-03: 60 mg/h via INTRAVENOUS
  Filled 2011-01-03: qty 9

## 2011-01-03 MED ORDER — PROPOFOL 10 MG/ML IV EMUL
INTRAVENOUS | Status: DC | PRN
  Administered 2011-01-03: 150 mg via INTRAVENOUS

## 2011-01-03 MED ORDER — DOPAMINE-DEXTROSE 3.2-5 MG/ML-% IV SOLN
10.0000 ug/kg/min | INTRAVENOUS | Status: DC
  Administered 2011-01-03 – 2011-01-04 (×2): 10 ug/kg/min via INTRAVENOUS
  Filled 2011-01-03: qty 250

## 2011-01-03 MED ORDER — GLYCOPYRROLATE 0.2 MG/ML IJ SOLN
INTRAMUSCULAR | Status: DC | PRN
  Administered 2011-01-03: .4 mg via INTRAVENOUS

## 2011-01-03 MED ORDER — FENTANYL CITRATE 0.05 MG/ML IJ SOLN: 25.0000 ug | INTRAMUSCULAR | Status: DC | PRN

## 2011-01-03 MED ORDER — BISACODYL 10 MG RE SUPP: 10.0000 mg | Freq: Every day | RECTAL | Status: DC | PRN

## 2011-01-03 MED ORDER — HEPARIN SOD (PORCINE) IN D5W 100 UNIT/ML IV SOLN
900.0000 [IU]/h | INTRAVENOUS | Status: DC
  Administered 2011-01-03: 900 [IU]/h via INTRAVENOUS
  Filled 2011-01-03: qty 250

## 2011-01-03 MED ORDER — FENTANYL CITRATE 0.05 MG/ML IJ SOLN
INTRAMUSCULAR | Status: DC | PRN
  Administered 2011-01-03: 50 ug via INTRAVENOUS
  Administered 2011-01-03: 25 ug via INTRAVENOUS
  Administered 2011-01-03: 50 ug via INTRAVENOUS
  Administered 2011-01-03: 100 ug via INTRAVENOUS
  Administered 2011-01-03: 25 ug via INTRAVENOUS

## 2011-01-03 SURGICAL SUPPLY — 64 items
4.5MM CROWE POINT TWIST DRILL ×1 IMPLANT
BANDAGE ACE 4 STERILE (GAUZE/BANDAGES/DRESSINGS) ×1 IMPLANT
BANDAGE ELASTIC 4 VELCRO ST LF (GAUZE/BANDAGES/DRESSINGS) ×2 IMPLANT
BANDAGE ELASTIC 6 VELCRO ST LF (GAUZE/BANDAGES/DRESSINGS) ×2 IMPLANT
BANDAGE ESMARK 6X9 LF (GAUZE/BANDAGES/DRESSINGS) IMPLANT
BANDAGE GAUZE ELAST BULKY 4 IN (GAUZE/BANDAGES/DRESSINGS) ×2 IMPLANT
BIT DRILL CALIBRATED 4.3MMX365 (DRILL) IMPLANT
BIT DRILL CROWE PNT TWST 4.5MM (DRILL) IMPLANT
BNDG CMPR 9X6 STRL LF SNTH (GAUZE/BANDAGES/DRESSINGS)
BNDG COHESIVE 4X5 TAN STRL (GAUZE/BANDAGES/DRESSINGS) ×2 IMPLANT
BNDG ESMARK 6X9 LF (GAUZE/BANDAGES/DRESSINGS)
BRUSH SCRUB DISP (MISCELLANEOUS) ×4 IMPLANT
CALIBRATED DRILL 4.3MM X 365MM ×1 IMPLANT
CLOTH BEACON ORANGE TIMEOUT ST (SAFETY) ×2 IMPLANT
COVER MAYO STAND STRL (DRAPES) ×2 IMPLANT
COVER SURGICAL LIGHT HANDLE (MISCELLANEOUS) ×4 IMPLANT
DRAPE C-ARM 42X72 X-RAY (DRAPES) ×2 IMPLANT
DRAPE C-ARMOR (DRAPES) ×2 IMPLANT
DRAPE INCISE IOBAN 66X45 STRL (DRAPES) ×2 IMPLANT
DRAPE ORTHO SPLIT 77X108 STRL (DRAPES) ×4
DRAPE SURG ORHT 6 SPLT 77X108 (DRAPES) ×2 IMPLANT
DRAPE U-SHAPE 47X51 STRL (DRAPES) ×2 IMPLANT
DRILL CALIBRATED 4.3MMX365 (DRILL) ×2
DRILL CROWE POINT TWIST 4.5MM (DRILL) ×2
DRSG ADAPTIC 3X8 NADH LF (GAUZE/BANDAGES/DRESSINGS) ×1 IMPLANT
DRSG PAD ABDOMINAL 8X10 ST (GAUZE/BANDAGES/DRESSINGS) ×1 IMPLANT
ELECT REM PT RETURN 9FT ADLT (ELECTROSURGICAL) ×2
ELECTRODE REM PT RTRN 9FT ADLT (ELECTROSURGICAL) ×1 IMPLANT
EVACUATOR 1/8 PVC DRAIN (DRAIN) IMPLANT
GAUZE KERLIX 2  STERILE LF (GAUZE/BANDAGES/DRESSINGS) ×1 IMPLANT
GAUZE XEROFORM 1X8 LF (GAUZE/BANDAGES/DRESSINGS) ×2 IMPLANT
GLOVE BIO SURGEON STRL SZ7.5 (GLOVE) ×2 IMPLANT
GLOVE BIO SURGEON STRL SZ8 (GLOVE) ×2 IMPLANT
GLOVE BIOGEL PI IND STRL 7.5 (GLOVE) ×1 IMPLANT
GLOVE BIOGEL PI IND STRL 8 (GLOVE) ×1 IMPLANT
GLOVE BIOGEL PI INDICATOR 7.5 (GLOVE) ×1
GLOVE BIOGEL PI INDICATOR 8 (GLOVE) ×1
GOWN PREVENTION PLUS XLARGE (GOWN DISPOSABLE) ×2 IMPLANT
GOWN STRL NON-REIN LRG LVL3 (GOWN DISPOSABLE) ×4 IMPLANT
GUIDEWIRE BEAD TIP (WIRE) ×1 IMPLANT
KIT BASIN OR (CUSTOM PROCEDURE TRAY) ×2 IMPLANT
KIT ROOM TURNOVER OR (KITS) ×2 IMPLANT
NAIL FEM RETRO 10.5X340 (Nail) ×1 IMPLANT
NAIL FEM RETRO 10.5X360 (Nail) ×1 IMPLANT
PACK ORTHO EXTREMITY (CUSTOM PROCEDURE TRAY) ×2 IMPLANT
PAD ARMBOARD 7.5X6 YLW CONV (MISCELLANEOUS) ×4 IMPLANT
SCREW CORT TI DBL LEAD 5X30 (Screw) ×2 IMPLANT
SCREW CORT TI DBL LEAD 5X42 (Screw) ×1 IMPLANT
SCREW CORT TI DBL LEAD 5X58 (Screw) ×1 IMPLANT
SCREW CORT TI DBL LEAD 5X65 (Screw) ×1 IMPLANT
SPONGE GAUZE 4X4 12PLY (GAUZE/BANDAGES/DRESSINGS) ×2 IMPLANT
SPONGE LAP 18X18 X RAY DECT (DISPOSABLE) ×2 IMPLANT
STAPLER VISISTAT 35W (STAPLE) ×2 IMPLANT
STOCKINETTE IMPERVIOUS LG (DRAPES) ×2 IMPLANT
SUT PROLENE 3 0 PS 2 (SUTURE) ×2 IMPLANT
SUT VIC AB 0 CT1 27 (SUTURE)
SUT VIC AB 0 CT1 27XBRD ANBCTR (SUTURE) IMPLANT
SUT VIC AB 2-0 CT1 27 (SUTURE)
SUT VIC AB 2-0 CT1 TAPERPNT 27 (SUTURE) IMPLANT
SUT VIC AB 2-0 CT3 27 (SUTURE) IMPLANT
TOWEL OR 17X24 6PK STRL BLUE (TOWEL DISPOSABLE) ×2 IMPLANT
TOWEL OR 17X26 10 PK STRL BLUE (TOWEL DISPOSABLE) ×4 IMPLANT
TUBE CONNECTING 12X1/4 (SUCTIONS) ×2 IMPLANT
YANKAUER SUCT BULB TIP NO VENT (SUCTIONS) ×2 IMPLANT

## 2011-01-03 NOTE — Progress Notes (Signed)
Heparin level 0.25 at 750 units/hr. No bleeding complications. Due to be turned off at 0530 for OR trip at 1300 today. Suggest continuing until around noon as heparin effect should be undetectable by incision time. ie cleared in appx 1hour. Will increase to 900 units/hr now and f/u post op.

## 2011-01-03 NOTE — Progress Notes (Signed)
Per MD, want to delay coumadin start until tomorrow. D/c coumadin for tonight.

## 2011-01-03 NOTE — H&P (Signed)
Malayasia Mirkin is an 65 y.o. female.   Chief Complaint: S/p cardiorespiratory arrest  HPI: Patient is a 65 y/o F with PMHx of HTN, DL who had a fall on 40/98/11, initially was taken to [facility] and was diagnosed with R femoral DVT and started on anticoagulation. Pt had persistent pain and she had further imaging studies which showed R femoral fracture. Pt was transferred to [facility] for further management and was taken to the OR on 01/03/11 and underwent Open reduction and internal fixation of the right femur fracture. Pt then had an episode of torsades on the floor and code blue was called. Pt was intubated , CPR was performed and received 1g of Mag. Pt was transferred to 2300 and developed an episode of A fib with RVR and the Vt tach. HR returned to 120's (A fib) and the became hypotensive. Pt was cardioverted and the started on Dopamine. Central line was placed on the L subclavian A and EKG showed ST elevation on the inferior leads.  Cardiology was consulted and patient will be taken to the cath lab.  Past Medical History  Diagnosis Date  . Hypertension   . 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 Vitamin B12 deficiency (dietary) anemia 12/27/2010  . Arthritis     Past Surgical History  Procedure Date  . Abdominal hysterectomy     06/2010    History reviewed. No pertinent family history. Social History:  reports that she has never smoked. She has never used smokeless tobacco. She reports that she does not drink alcohol or use illicit drugs.  Allergies: No Known Allergies  Medications Prior to Admission  Medication Dose Route Frequency Provider Last Rate Last Dose  . 0.9 %  sodium chloride infusion   Intravenous Continuous Ripudeep Jenna Luo, MD 100 mL/hr at 01/03/11 1805    . 0.9 %  sodium chloride infusion  250 mL Intravenous PRN Orbie Hurst      . 0.9 %  sodium chloride infusion   Intravenous Continuous Orbie Hurst      . acetaminophen (TYLENOL) tablet 650 mg  650 mg Oral Q6H PRN Srikar A  Reddy   650 mg at 01/02/11 2005  . amiodarone (CORDARONE) 450 mg in dextrose 5 % 250 mL infusion  60 mg/hr Intravenous Continuous Orbie Hurst      . atenolol (TENORMIN) tablet 50 mg  50 mg Oral BID Srikar A Reddy   50 mg at 01/02/11 1847  . bisacodyl (DULCOLAX) suppository 10 mg  10 mg Rectal Daily PRN Mearl Latin, PA      . calcium gluconate 1 g in sodium chloride 0.9 % 100 mL IVPB  1 g Intravenous Once Lonia Farber, MD      . ceFAZolin (ANCEF) IVPB 1 g/50 mL premix  1 g Intravenous Once Mearl Latin, PA   1 g at 01/03/11 0830  . ceFAZolin (ANCEF) IVPB 1 g/50 mL premix  1 g Intravenous Q6H Mearl Latin, PA   1 g at 01/03/11 1600  . cyanocobalamin ((VITAMIN B-12)) injection 1,000 mcg  1,000 mcg Intramuscular Weekly Srikar A Reddy      . dextrose 5 % and 0.9 % NaCl with KCl 20 mEq/L infusion   Intravenous Continuous Rachal A David 75 mL/hr at 01/02/11 0500    . DOPamine (INTROPIN) 3.2-5 MG/ML-% infusion           . DOPamine (INTROPIN) 800 mg in dextrose 5 % 250 mL infusion  10 mcg/kg/min Intravenous Titrated  Orbie Hurst      . fentaNYL (SUBLIMAZE) injection 100 mcg  100 mcg Intravenous Q2H PRN Lonia Farber, MD      . fentaNYL (SUBLIMAZE) injection 25-50 mcg  25-50 mcg Intravenous Q2H PRN Orbie Hurst      . heparin ADULT infusion 100 units/ml (25000 units/250 ml)  900 Units/hr Intravenous Continuous Janice Coffin, RPH 9 mL/hr at 01/03/11 0259 900 Units/hr at 01/03/11 0259  . heparin ADULT infusion 100 units/ml (25000 units/250 ml)  900 Units/hr Intravenous Continuous Maryanna Shape [place], [place] 9 mL/hr at 01/03/11 1810 900 Units/hr at 01/03/11 1810  . LORazepam (ATIVAN) injection 1 mg  1 mg Intravenous Q2H PRN Mary A. Lynch      . magnesium hydroxide (MILK OF MAGNESIA) suspension 30 mL  30 mL Oral Daily PRN Mearl Latin, PA      . magnesium sulfate 40 MG/ML IVPB           . magnesium sulfate 50 % injection 1 g  1 g Intravenous Once Orbie Hurst      . midazolam  (VERSED) 2 MG/2ML injection           . midazolam (VERSED) injection 1-2 mg  1-2 mg Intravenous Q2H PRN Orbie Hurst      . ondansetron (ZOFRAN) injection 4 mg  4 mg Intravenous Q6H PRN Raiford Simmonds, MD   4 mg at 01/03/11 1207  . pantoprazole (PROTONIX) injection 40 mg  40 mg Intravenous QHS Orbie Hurst      . phytonadione (VITAMIN K) 5 mg in dextrose 5 % 50 mL IVPB  5 mg Intravenous Once Ripudeep K Rai, MD   5 mg at 01/02/11 1200  . potassium chloride 10 MEQ/50ML IVPB           . DISCONTD: calcium-vitamin D (OSCAL WITH D) 500-200 MG-UNIT per tablet 1 tablet  1 tablet Oral BID Srikar A Reddy   1 tablet at 01/02/11 2220  . DISCONTD: docusate sodium (COLACE) capsule 100 mg  100 mg Oral BID Rachal A David   100 mg at 01/02/11 2220  . DISCONTD: HYDROcodone-acetaminophen (NORCO) 5-325 MG per tablet 1-2 tablet  1-2 tablet Oral Q4H PRN Mearl Latin, PA      . DISCONTD: HYDROmorphone (DILAUDID) 1 MG/ML injection           . DISCONTD: HYDROmorphone (DILAUDID) injection 0.25-0.5 mg  0.25-0.5 mg Intravenous Q5 min PRN Raiford Simmonds, MD   0.25 mg at 01/03/11 1228  . DISCONTD: LORazepam (ATIVAN) injection 2 mg  2 mg Intravenous Q4H PRN Lonia Farber, MD      . DISCONTD: metoCLOPramide (REGLAN) injection 5-10 mg  5-10 mg Intravenous Q8H PRN Mearl Latin, PA      . DISCONTD: morphine 2 MG/ML injection 1 mg  1 mg Intravenous Q2H PRN Rachal A David      . DISCONTD: ondansetron (ZOFRAN) injection 4 mg  4 mg Intravenous Q6H PRN Rachal A David      . DISCONTD: ondansetron (ZOFRAN) tablet 4 mg  4 mg Oral Q6H PRN Rachal A David      . DISCONTD: polysaccharide iron (NIFEREX) capsule 150 mg  150 mg Oral Daily Srikar A Reddy   150 mg at 01/03/11 1700  . DISCONTD: sodium chloride irrigation 0.9 %    PRN Doralee Albino Handy   1,000 mL at 01/03/11 0914  . DISCONTD: traZODone (DESYREL) tablet 50 mg  50 mg Oral QHS Rachal A David   50  mg at 01/02/11 2220  . DISCONTD: warfarin (COUMADIN) tablet 7.5 mg  7.5 mg Oral  ONCE-1800 Maryanna Shape [place], [place]       Medications Prior to Admission  Medication Sig Dispense Refill  . acetaminophen (TYLENOL) 325 MG tablet Take 2 tablets (650 mg total) by mouth every 6 (six) hours as needed (or Fever >/= 101).  30 tablet    . atenolol (TENORMIN) 50 MG tablet Take 50 mg by mouth 2 (two) times daily.        . Calcium Carbonate-Vitamin D ([employer] CALCIUM 600 + D) 600-400 MG-UNIT per tablet Take 1 tablet by mouth 2 (two) times daily.        . cyanocobalamin (,VITAMIN B-12,) 1000 MCG/ML injection Inject 1 mL (1,000 mcg total) into the muscle once a week.  1 mL    . docusate sodium 100 MG CAPS Take 100 mg by mouth 2 (two) times daily.  10 capsule    . HYDROcodone-acetaminophen (NORCO) 5-325 MG per tablet Take 1 tablet by mouth every 6 (six) hours as needed for pain.  20 tablet  0  . polysaccharide iron (NIFEREX) 150 MG CAPS capsule Take 1 capsule (150 mg total) by mouth daily.  30 each    . traZODone (DESYREL) 50 MG tablet Take 50 mg by mouth at bedtime. For insomnia      . warfarin (COUMADIN) 5 MG tablet Take 1 tablet (5 mg total) by mouth at bedtime.        Results for orders placed during the hospital encounter of 01/01/11 (from the past 48 hour(s))  BASIC METABOLIC PANEL     Status: Abnormal   Collection Time   01/02/11  9:00 AM      Component Value Range Comment   Sodium 136  135 - 145 (mEq/L)    Potassium 3.7  3.5 - 5.1 (mEq/L)    Chloride 100  96 - 112 (mEq/L)    CO2 26  19 - 32 (mEq/L)    Glucose, Bld 104 (*) 70 - 99 (mg/dL)    BUN 13  6 - 23 (mg/dL)    Creatinine, Ser 9.60 (*) 0.50 - 1.10 (mg/dL)    Calcium 8.5  8.4 - 10.5 (mg/dL)    GFR calc non Af Amer >90  >90 (mL/min)    GFR calc Af Amer >90  >90 (mL/min)   CBC     Status: Abnormal   Collection Time   01/02/11  9:00 AM      Component Value Range Comment   WBC 6.8  4.0 - 10.5 (K/uL)    RBC 3.44 (*) 3.87 - 5.11 (MIL/uL)    Hemoglobin 9.4 (*) 12.0 - 15.0 (g/dL)    HCT 45.4 (*) 09.8 - 46.0 (%)    MCV  82.3  78.0 - 100.0 (fL)    MCH 27.3  26.0 - 34.0 (pg)    MCHC 33.2  30.0 - 36.0 (g/dL)    RDW 11.9 (*) 14.7 - 15.5 (%)    Platelets 388  150 - 400 (K/uL)   PROTIME-INR     Status: Abnormal   Collection Time   01/02/11  9:00 AM      Component Value Range Comment   Prothrombin Time 33.1 (*) 11.6 - 15.2 (seconds)    INR 3.18 (*) 0.00 - 1.49    PROTIME-INR     Status: Abnormal   Collection Time   01/02/11  3:41 PM      Component Value Range Comment  Prothrombin Time 18.9 (*) 11.6 - 15.2 (seconds)    INR 1.55 (*) 0.00 - 1.49    MRSA PCR SCREENING     Status: Normal   Collection Time   01/02/11  7:46 PM      Component Value Range Comment   MRSA by PCR NEGATIVE  NEGATIVE    HEPARIN LEVEL (UNFRACTIONATED)     Status: Abnormal   Collection Time   01/03/11 12:12 AM      Component Value Range Comment   Heparin Unfractionated 0.25 (*) 0.30 - 0.70 (IU/mL)   CBC     Status: Abnormal   Collection Time   01/03/11  5:50 AM      Component Value Range Comment   WBC 8.1  4.0 - 10.5 (K/uL)    RBC 3.34 (*) 3.87 - 5.11 (MIL/uL)    Hemoglobin 9.2 (*) 12.0 - 15.0 (g/dL)    HCT 16.1 (*) 09.6 - 46.0 (%)    MCV 82.9  78.0 - 100.0 (fL)    MCH 27.5  26.0 - 34.0 (pg)    MCHC 33.2  30.0 - 36.0 (g/dL)    RDW 04.5 (*) 40.9 - 15.5 (%)    Platelets 450 (*) 150 - 400 (K/uL)   BASIC METABOLIC PANEL     Status: Abnormal   Collection Time   01/03/11  5:50 AM      Component Value Range Comment   Sodium 140  135 - 145 (mEq/L)    Potassium 3.7  3.5 - 5.1 (mEq/L)    Chloride 105  96 - 112 (mEq/L)    CO2 27  19 - 32 (mEq/L)    Glucose, Bld 99  70 - 99 (mg/dL)    BUN 12  6 - 23 (mg/dL)    Creatinine, Ser 8.11 (*) 0.50 - 1.10 (mg/dL)    Calcium 9.2  8.4 - 10.5 (mg/dL)    GFR calc non Af Amer >90  >90 (mL/min)    GFR calc Af Amer >90  >90 (mL/min)   PROTIME-INR     Status: Abnormal   Collection Time   01/03/11  5:50 AM      Component Value Range Comment   Prothrombin Time 15.3 (*) 11.6 - 15.2 (seconds)    INR  1.18  0.00 - 1.49    POCT I-STAT 4, (NA,K, GLUC, HGB,HCT)     Status: Abnormal   Collection Time   01/03/11 10:38 AM      Component Value Range Comment   Sodium 134 (*) 135 - 145 (mEq/L)    Potassium >9.0 (*) 3.5 - 5.1 (mEq/L)    Glucose, Bld 128 (*) 70 - 99 (mg/dL)    HCT 91.4 (*) 78.2 - 46.0 (%)    Hemoglobin 9.2 (*) 12.0 - 15.0 (g/dL)    Comment VALUES EXPECTED, NO REPEAT     BLOOD GAS, ARTERIAL     Status: Abnormal   Collection Time   01/03/11  8:00 PM      Component Value Range Comment   FIO2 1.00      Delivery systems OXYGEN MASK      pH, Arterial 7.484 (*) 7.350 - 7.400     pCO2 arterial 28.6 (*) 35.0 - 45.0 (mmHg)    pO2, Arterial 411.0 (*) 80.0 - 100.0 (mmHg)    Bicarbonate 21.3  20.0 - 24.0 (mEq/L)    TCO2 22.1  0 - 100 (mmol/L)    Acid-base deficit 1.7  0.0 - 2.0 (mmol/L)  O2 Saturation 100.0      Patient temperature 98.6      Collection site LEFT BRACHIAL      Drawn by [id number]      Sample type ARTERIAL DRAW      Allens test (pass/fail) PASS  PASS    CBC     Status: Abnormal   Collection Time   01/03/11  8:09 PM      Component Value Range Comment   WBC 10.5  4.0 - 10.5 (K/uL)    RBC 2.16 (*) 3.87 - 5.11 (MIL/uL) TEST CANCELLED PER RN   Hemoglobin 5.9 (*) 12.0 - 15.0 (g/dL)    HCT 91.4 (*) 78.2 - 46.0 (%) TEST CANCELLED PER RN   MCV 84.3  78.0 - 100.0 (fL) TEST CANCELLED PER RN   MCH 27.3  26.0 - 34.0 (pg) TEST CANCELLED PER RN   MCHC 32.4  30.0 - 36.0 (g/dL) TEST CANCELLED PER RN   RDW 16.9 (*) 11.5 - 15.5 (%) TEST CANCELLED PER RN   Platelets 305  150 - 400 (K/uL)   BASIC METABOLIC PANEL     Status: Abnormal   Collection Time   01/03/11  8:09 PM      Component Value Range Comment   Sodium 142  135 - 145 (mEq/L)    Potassium 2.4 (*) 3.5 - 5.1 (mEq/L)    Chloride 116 (*) 96 - 112 (mEq/L)    CO2 16 (*) 19 - 32 (mEq/L)    Glucose, Bld 214 (*) 70 - 99 (mg/dL)    BUN 8  6 - 23 (mg/dL)    Creatinine, Ser 9.56 (*) 0.50 - 1.10 (mg/dL)    Calcium 6.5 (*) 8.4 - 10.5  (mg/dL)    GFR calc non Af Amer >90  >90 (mL/min)    GFR calc Af Amer >90  >90 (mL/min)   APTT     Status: Normal   Collection Time   01/03/11  8:09 PM      Component Value Range Comment   aPTT 37  24 - 37 (seconds)   POCT I-STAT 3, BLOOD GAS (G3+)     Status: Abnormal   Collection Time   01/03/11  8:51 PM      Component Value Range Comment   pH, Arterial 7.516 (*) 7.350 - 7.400     pCO2 arterial 31.1 (*) 35.0 - 45.0 (mmHg)    pO2, Arterial 344.0 (*) 80.0 - 100.0 (mmHg)    Bicarbonate 25.2 (*) 20.0 - 24.0 (mEq/L)    TCO2 26  0 - 100 (mmol/L)    O2 Saturation 100.0      Acid-Base Excess 2.0  0.0 - 2.0 (mmol/L)    Patient temperature 98.6 F      Collection site BRACHIAL ARTERY      Drawn by Operator      Sample type ARTERIAL     TYPE AND SCREEN     Status: Normal (Preliminary result)   Collection Time   01/03/11  9:30 PM      Component Value Range Comment   ABO/RH(D) O NEG      Antibody Screen NEG      Sample Expiration 01/06/2011      Unit Number [id number]      Blood Component Type RBC CPDA1, LR      Unit division 00      Status of Unit ALLOCATED      Transfusion Status OK TO TRANSFUSE      Crossmatch Result  Compatible      Unit Number [id number]      Blood Component Type RBC LR PHER2      Unit division 00      Status of Unit ALLOCATED      Transfusion Status OK TO TRANSFUSE      Crossmatch Result Compatible     ABO/RH     Status: Normal (Preliminary result)   Collection Time   01/03/11  9:30 PM      Component Value Range Comment   ABO/RH(D) O NEG     BASIC METABOLIC PANEL     Status: Abnormal   Collection Time   01/03/11  9:57 PM      Component Value Range Comment   Sodium 136  135 - 145 (mEq/L)    Potassium 3.1 (*) 3.5 - 5.1 (mEq/L)    Chloride 102  96 - 112 (mEq/L)    CO2 25  19 - 32 (mEq/L)    Glucose, Bld 292 (*) 70 - 99 (mg/dL)    BUN 12  6 - 23 (mg/dL)    Creatinine, Ser 4.09  0.50 - 1.10 (mg/dL)    Calcium 81.1 (*) 8.4 - 10.5 (mg/dL)    GFR calc non Af Amer  >90  >90 (mL/min)    GFR calc Af Amer >90  >90 (mL/min)   CBC     Status: Abnormal   Collection Time   01/03/11  9:57 PM      Component Value Range Comment   WBC 14.2 (*) 4.0 - 10.5 (K/uL)    RBC 3.02 (*) 3.87 - 5.11 (MIL/uL)    Hemoglobin 8.2 (*) 12.0 - 15.0 (g/dL)    HCT 91.4 (*) 78.2 - 46.0 (%)    MCV 84.4  78.0 - 100.0 (fL)    MCH 27.2  26.0 - 34.0 (pg)    MCHC 32.2  30.0 - 36.0 (g/dL)    RDW 95.6 (*) 21.3 - 15.5 (%)    Platelets 432 (*) 150 - 400 (K/uL)   MAGNESIUM     Status: Abnormal   Collection Time   01/03/11  9:57 PM      Component Value Range Comment   Magnesium 4.4 (*) 1.5 - 2.5 (mg/dL)   PHOSPHORUS     Status: Normal   Collection Time   01/03/11  9:57 PM      Component Value Range Comment   Phosphorus 4.0  2.3 - 4.6 (mg/dL)   PREPARE RBC (CROSSMATCH)     Status: Normal   Collection Time   01/03/11 10:30 PM      Component Value Range Comment   Order Confirmation ORDER PROCESSED BY BLOOD BANK      Dg Femur Right  01/03/2011  *RADIOLOGY REPORT*  Clinical Data: Femur fracture.  RIGHT FEMUR - 2 VIEW  Comparison: 01/01/2011.  Findings: Six intraoperative C-arm views submitted for review after surgery.  This reveals placement of right femoral rod with proximal and distal fixation screws transfixing spiral fracture of the right femur with much better alignment.  Small fracture fragment separate from the major fracture fragment.  IMPRESSION: Open reduction and internal fixation of the right femur fracture.  Original Report Authenticated By: Fuller Canada, M.D.   Dg Chest Port 1 View  01/03/2011  *RADIOLOGY REPORT*  Clinical Data: Respiratory distress.  Central line placement  PORTABLE CHEST - 1 VIEW  Comparison: 01/03/2011 at 2058 hours  Findings: Endotracheal tube remains in place with tip about 3.2 cm above the carina.  Since the previous study, there is been interval placement of a left central venous catheter with tip over the low SVC region.  An NG tube has been placed with  tip not visualized but below the left hemidiaphragm, consistent with location at least in the upper stomach.  No pneumothorax.  No blunting of costophrenic angles.  Normal heart size and pulmonary vascularity. Emphysematous changes in the lungs.  Suggestion of central bronchiectasis and mild perihilar infiltration.  IMPRESSION: Appliances appear to be in satisfactory location.  No significant complication demonstrated.  Original Report Authenticated By: Marlon Pel, M.D.   Dg Chest Port 1 View  01/03/2011  *RADIOLOGY REPORT*  Clinical Data: Intubation.  PORTABLE CHEST - 1 VIEW 01/03/2011 2028 hours:  Comparison: Portable chest x-ray 01/01/2011 and two-view chest x- ray 12/28/2010 [facility].  Findings: Endotracheal tube tip in satisfactory position approximately 3 cm above the carina.  Artifact overlying the left side of the upper chest mimics a pneumothorax.  No evidence of pneumothorax or pneumomediastinum.  Patchy airspace opacities at the right lung base.  Lungs otherwise clear.  Cardiac silhouette normal in size.  IMPRESSION:  1.  Endotracheal tube tip in satisfactory position approximately 3 cm above the carina. 2.  Atelectasis versus bronchopneumonia at the right lung base.  Original Report Authenticated By: Arnell Sieving, M.D.   Dg Femur Right Port  01/03/2011  *RADIOLOGY REPORT*  Clinical Data: Distal femur fracture.  PORTABLE RIGHT FEMUR - 2 VIEW  Comparison: 01/01/2011  Findings: Intermedullary rod with proximal and distal fixation screws are seen transfixing a distal femoral shaft fracture in near anatomic alignment.  Associated soft tissue swelling of the thigh noted.  IMPRESSION: Intermedullary rod fixation of distal femoral shaft fracture in near anatomic alignment.  Original Report Authenticated By: Danae Orleans, M.D.    ROS All other systems were negative except as above in HPI.  Blood pressure 100/63, pulse 105, temperature 99.9 F (37.7 C), temperature source  Oral, resp. rate 17, height 5' (1.524 m), weight 49.896 kg (110 lb), SpO2 99.00%. Physical Exam  Pt is intubated, critically ill. HEENT: ET tube in place, no JVD CV: irregular rhythm, S1 , S2 and s3. Lungs: Decreased breath sounds at the pulm bases. Bibasuilar crackles. Abdomen; soft, non tender, non distended. BS + Extrem: weak pulses distally, dressing on R femoral area.  Neurol: sedated  Assessment/Plan Patient is a 65 y/o F with PMHx of HTN, DL who was diagnosed with R femoral DVT, started on anticoagulation and R femoral fracture s/p Open reduction and internal fixation of the right femur fracture and on the post op period developed cardioresp arrest s/p CPR with cardiac arrhythmias (torsades, then afib with RVR and VT).  1. S/p cardirespiratory arres - Likely due to acute coronary syndrome - PE on the differential, but pt has been on anticoagulation due to DVT with coumadin and then with heparin drip - EKG with ST elevation on the inferior leads. - Bedside echo showed hypokinesia on the inferior wall with an estimated EF 25-30% - Pt had Mag 2 g total - BMP with K 3/1. Will replace with 40 meq stat, recheck and keep K above 4 - Echo pending - CXR with L subclavian central line, ET placed, no focal infiltrates - Cont dopamine  - cardiac enzymes pending - Pt will be taken to the cath lab now.  2. A fib with RVR - sp cardioversion - Likely due to ASCS - Will take to cath lab -  Given Amiodarone 300 mg and cont amiod drip. - repeat Hb 8.2. Pt will be likely restarted on heparin  3. R low extrem DVT  - Pt will be re started on heparin at the cath lab - follow CBC  4. Anemia - Hb 8.2 fromn 9.2 - Recent r femorkla surg - On anticaog with heparin (will continue due to ACS) - Follow CBC - Will transfuse to keep Hb above 10  5. Hypokalemia - Replace K with 40 meq now, recheck BMP and Mag, phosp - Mag given 2 g  6. DVT and GI prophyl;axis - Hep drip and Protonix Iv  7.  FEN - NS at 149ml/h, replcae K, mag, follow BMP, NPO for now.  Pt was discussed with cardiology (Dr Donovan Kail) and Dr Carola Frost ([facility]).  Shanika Levings 01/03/2011, 10:38 PM

## 2011-01-03 NOTE — Brief Op Note (Signed)
01/01/2011 - 01/03/2011  11:06 AM  PATIENT:  [name]  65 y.o. female  PRE-OPERATIVE DIAGNOSIS:  Right Femur Fracture, subacute  POST-OPERATIVE DIAGNOSIS:  Right Femur Fracture, subacute  PROCEDURE:  Procedure(s): INTRAMEDULLARY (IM) RETROGRADE FEMORAL NAILING with Biomet 10.78mm x statically locked  SURGEON:  Surgeon(s): [employer]  PHYSICIAN ASSISTANT: Montez Morita, [facility]  ANESTHESIA:   general  EBL:  Total I/O In: 1000 [I.V.:1000] Out: 700 [Urine:700]  BLOOD ADMINISTERED:none  DRAINS: none   LOCAL MEDICATIONS USED:  NONE  SPECIMEN:  No Specimen  DISPOSITION OF SPECIMEN:  N/A  COUNTS:  YES  TOURNIQUET:  * No tourniquets in log *  DICTATION: .Other Dictation: Dictation Number [id number]  PLAN OF CARE: Admit to inpatient   PATIENT DISPOSITION:  PACU - hemodynamically stable.   Delay start of Pharmacological VTE agent (>24hrs) due to surgical blood loss or risk of bleeding: NO

## 2011-01-03 NOTE — Progress Notes (Signed)
Pt had run of 18 beats vtach around 1700, Dr. Isidoro Donning with [facility] notified, no orders were received since pt was asymptomatic and VSS despite conflicting potassium levels this am (3.7 @0550  and >9.0 around 10).  Pt went into torsades around 1900 and code blue was called.  Pt was quickly brought back into a ST rhythm and transferred to 2304.  [facility] hospitalist on call and orthopedic on call notified. Ave Filter

## 2011-01-03 NOTE — Transfer of Care (Signed)
Immediate Anesthesia Transfer of Care Note  Patient: [name]  Procedure(s) Performed:  INTRAMEDULLARY (IM) RETROGRADE FEMORAL NAILING - Right IM Retrograde Femoral Nailing  Patient Location: PACU  Anesthesia Type: General  Level of Consciousness: awake and responds to stimulation  Airway & Oxygen Therapy: Patient Spontanous Breathing and Patient connected to nasal cannula oxygen  Post-op Assessment: Report given to PACU RN, Post -op Vital signs reviewed and stable and Patient moving all extremities  Post vital signs: Reviewed and stable  Complications: No apparent anesthesia complications

## 2011-01-03 NOTE — Preoperative (Signed)
Beta Blockers   Reason not to administer Beta Blockers:Atenolol held 10 p.m. 01/02/2011 b/c "parameters not met".

## 2011-01-03 NOTE — Progress Notes (Signed)
ANTICOAGULATION CONSULT NOTE - Follow Up Consult  Pharmacy Consult for Heparin Indication: recent DVT (12/24/10)  No Known Allergies  Patient Measurements: Height: 5' (152.4 cm) Weight: 110 lb (49.896 kg) IBW/kg (Calculated) : 45.5  Heparin Dosing Weight: 49.9 kg  Vital Signs: Temp: 99.6 F (37.6 C) (12/07 1320) Temp src: Oral (12/07 0445) BP: 120/65 mmHg (12/07 1317) Pulse Rate: 116  (12/07 1319)  Labs:  Basename 01/03/11 0550 01/03/11 0012 01/02/11 1541 01/02/11 0900 01/01/11 1645  HGB 9.2* -- -- 9.4* --  HCT 27.7* -- -- 28.3* 33.2*  PLT 450* -- -- 388 406*  APTT -- -- -- -- --  LABPROT 15.3* -- 18.9* 33.1* --  INR 1.18 -- 1.55* 3.18* --  HEPARINUNFRC -- 0.25* -- -- --  CREATININE 0.47* -- -- 0.47* 0.58  CKTOTAL -- -- -- -- --  CKMB -- -- -- -- --  TROPONINI -- -- -- -- --   Estimated Creatinine Clearance: 49.7 ml/min (by C-G formula based on Cr of 0.47).   Medications:  Scheduled:    . atenolol  50 mg Oral BID  . calcium-vitamin D  1 tablet Oral BID  . ceFAZolin (ANCEF) IV  1 g Intravenous Once  . cyanocobalamin  1,000 mcg Intramuscular Weekly  . docusate sodium  100 mg Oral BID  . HYDROmorphone      . polysaccharide iron  150 mg Oral Daily  . traZODone  50 mg Oral QHS   Infusions:    . heparin 900 Units/hr (01/03/11 0259)    Assessment: [name] to resume heparin 6 hrs post-op for recent DVT. Heparin level was SUBtherapeutic this am and increased to 900 units/hr but was stopped for surgery before f/u heparin level due.   Procedure End Fri Jan 03, 2011 11:08  Goal of Therapy:  Heparin level 0.3-0.7 units/ml   Plan:  1. Heparin to resume at 19:00 tonight at a rate of 900 units/hr (9 ml/hr). No bolus with recent surgery. 2. Heparin level 6 hrs after resumed 3. Daily heparin level and CBC 4. Consider resuming Coumadin  [name] [name] 01/03/2011,1:28 PM

## 2011-01-03 NOTE — Progress Notes (Signed)
CSW called dtr to discuss bed offers. Dtr reported that she was thinking about taking patient home. Dtr reported that she feels patient could get better care at home and was still deciding if SNF would be best for patient. CSW and dtr spoke about safety issues and ensuring that patient would be safe if she returned home. Dtr stated she would get 24 hour care for patient and was interested in [facility]. CSW explained that PT was recommending SNF and that patient would receive more intensive therapy at a SNF. Dtr stated she wanted to speak with CM and would make a decision by Monday morning on SNF vs HH. CSW made CM aware of dtr's concerns and CM agreed to call her. CSW will continue to follow. [facility] called CSW and stated that patient could return at beginning of next week if family chooses that option. [place], [place] [phone]

## 2011-01-03 NOTE — Code Documentation (Signed)
Respond to code blue at 1920 pm .   Patient had right femur fracture repair today and had asymptomatic 18 beats V tachy at 5 pm while she was eating dinner.  Patient found to be unresponsive and apnea. Heart rhythm is noted to beTorsades progressing to V Fib and asystole.  Code team arrived and ACLS code protocol initiated. Patient was intubated and CPR initiated. One Amp Epinephrine and one shock delivered. IV magnesium and Calcium were given. Pt regained pulse and heart rhythm of Sinus Tachycardia. Admitting Physician [facility] and PCCM notified. Patient will be transferred to ICU for further managment.

## 2011-01-03 NOTE — Progress Notes (Signed)
ANTICOAGULATION CONSULT NOTE - Follow Up Consult  Pharmacy Consult for Coumadin Indication: recent DVT (12/24/10)  No Known Allergies  Patient Measurements: Height: 5' (152.4 cm) Weight: 110 lb (49.896 kg) IBW/kg (Calculated) : 45.5  Heparin Dosing Weight: 49.9 kg  Vital Signs: Temp: 99.9 F (37.7 C) (12/07 1425) Temp src: Oral (12/07 1425) BP: 102/89 mmHg (12/07 1425) Pulse Rate: 121  (12/07 1425)  Labs:  Basename 01/03/11 0550 01/03/11 0012 01/02/11 1541 01/02/11 0900 01/01/11 1645  HGB 9.2* -- -- 9.4* --  HCT 27.7* -- -- 28.3* 33.2*  PLT 450* -- -- 388 406*  APTT -- -- -- -- --  LABPROT 15.3* -- 18.9* 33.1* --  INR 1.18 -- 1.55* 3.18* --  HEPARINUNFRC -- 0.25* -- -- --  CREATININE 0.47* -- -- 0.47* 0.58  CKTOTAL -- -- -- -- --  CKMB -- -- -- -- --  TROPONINI -- -- -- -- --   Estimated Creatinine Clearance: 49.7 ml/min (by C-G formula based on Cr of 0.47).   Medications:  Scheduled:     . atenolol  50 mg Oral BID  . calcium-vitamin D  1 tablet Oral BID  . ceFAZolin (ANCEF) IV  1 g Intravenous Once  . ceFAZolin (ANCEF) IV  1 g Intravenous Q6H  . cyanocobalamin  1,000 mcg Intramuscular Weekly  . docusate sodium  100 mg Oral BID  . HYDROmorphone      . polysaccharide iron  150 mg Oral Daily  . traZODone  50 mg Oral QHS   Infusions:     . heparin 900 Units/hr (01/03/11 0259)  . heparin      Assessment: [name] to resume Coumadin for recent DVT. INR is SUBtherapeutic.  Home regimen: Coumadin 5 mg daily  Goal of Therapy:  INR 2-3   Plan:  1. Coumadin 7.5 mg po tonight 2. INR daily  Loura Back Danielle 01/03/2011,3:48 PM

## 2011-01-03 NOTE — Consult Note (Addendum)
[facility]   CARDIOLOGY CONSULT NOTE  Patient ID: [name], MRN: [id number], DOB/AGE: 67-Nov-1947 67 y.o. Admit date: 01/01/2011 Date of Consult: 01/03/2011  Primary Physician: Leanna Sato, MD Primary Cardiologist: New  Chief Complaint: femur fracture s/p ORIF Reason for Consultation: cardiac arrest, multiple arrhythmias, STEMI  HPI ALL HISTORY OBTAINED FROM CHART REVIEW AND PHYSICIAN REQUESTING THE CONSULT (Dr. Synetta Fail, PCCM) AS PT IS INTUBATED AND UNABLE TO PROVIDE ANY HX   Pt is a 65 yo woman with HTN, HLD and recent dx of DVT who presented 2 days ago s/p fall with femur fracture s/p ORIF today.  This evening, pt had cardiac arrest - was noted to be in torsades s/p 1gm IV mag and shock and was intubated.  K 2.4 on labs drawn during code.  She had a number of arrhythmias per critical care physician including AF with RVR that was hemodynamically significant and required DCCV and she also had possible VT.  SHe was given 300 mg IV amio.  She also had an episode of transient complete heart block.  She was put on dopamine for hypotension and bradycardia.  She is currently in sinus tachycardia.   Additional hx obtained from daughter [name] - Pt has been healthy and functional until recent episode of leg swelling and DVT.  SHe has never had any chest pain or other cardiac issues.  She is physically active and functional, lives alone.  She is not a smoker.   Past Medical History  Diagnosis Date  . Hypertension   . 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 Vitamin B12 deficiency (dietary) anemia 12/27/2010  . Arthritis       Surgical History:  Past Surgical History  Procedure Date  . Abdominal hysterectomy     06/2010     Home Meds: Prior to Admission medications   Medication Sig Start Date End Date Taking? Authorizing Provider  acetaminophen (TYLENOL) 325 MG tablet Take 2 tablets (650 mg total) by mouth every 6 (six) hours as needed (or Fever >/= 101). 12/30/10  01/09/11 Yes Corinna L Sullivan  atenolol (TENORMIN) 50 MG tablet Take 50 mg by mouth 2 (two) times daily.     Yes Historical Provider, MD  Calcium Carbonate-Vitamin D ([employer] CALCIUM 600 + D) 600-400 MG-UNIT per tablet Take 1 tablet by mouth 2 (two) times daily.     Yes Historical Provider, MD  cyanocobalamin (,VITAMIN B-12,) 1000 MCG/ML injection Inject 1 mL (1,000 mcg total) into the muscle once a week. 12/30/10 12/30/11 Yes Corinna L Sullivan  docusate sodium 100 MG CAPS Take 100 mg by mouth 2 (two) times daily. 12/30/10 01/09/11 Yes Corinna L Lendell Caprice  HYDROcodone-acetaminophen (NORCO) 5-325 MG per tablet Take 1 tablet by mouth every 6 (six) hours as needed for pain. 12/30/10 01/09/11 Yes Corinna L Sullivan  polysaccharide iron (NIFEREX) 150 MG CAPS capsule Take 1 capsule (150 mg total) by mouth daily. 12/30/10  Yes Corinna L Sullivan  traZODone (DESYREL) 50 MG tablet Take 50 mg by mouth at bedtime. For insomnia   Yes Historical Provider, MD  warfarin (COUMADIN) 5 MG tablet Take 1 tablet (5 mg total) by mouth at bedtime. 12/30/10 12/30/11 Yes Corinna Burman Freestone    Inpatient Medications:    . atenolol  50 mg Oral BID  . calcium gluconate  1 g Intravenous Once  . ceFAZolin (ANCEF) IV  1 g Intravenous Once  . ceFAZolin (ANCEF) IV  1 g Intravenous Q6H  . cyanocobalamin  1,000 mcg Intramuscular Weekly  .  DOPamine      . magnesium sulfate      . midazolam      . potassium chloride      . DISCONTD: calcium-vitamin D  1 tablet Oral BID  . DISCONTD: docusate sodium  100 mg Oral BID  . DISCONTD: HYDROmorphone      . DISCONTD: polysaccharide iron  150 mg Oral Daily  . DISCONTD: traZODone  50 mg Oral QHS  . DISCONTD: warfarin  7.5 mg Oral ONCE-1800    Allergies: No Known Allergies  History   Social History  . Marital Status: Single    Spouse Name: N/A    Number of Children: N/A  . Years of Education: N/A   Occupational History  . Not on file.   Social History Main Topics  . Smoking status:  Never Smoker   . Smokeless tobacco: Never Used  . Alcohol Use: No  . Drug Use: No  . Sexually Active: No   Other Topics Concern  . Not on file   Social History Narrative  . No narrative on file     History reviewed. No pertinent family history.   Review of Systems:unable to obtain - pt intubated   Labs: No results found for this basename: CKTOTAL:4,CKMB:4,TROPONINI:4 in the last 72 hours Lab Results  Component Value Date   WBC 10.5 01/03/2011   HGB 5.9* 01/03/2011   HCT 18.2* 01/03/2011   MCV 84.3 01/03/2011   PLT 305 01/03/2011    Lab 01/03/11 2009 01/01/11 1645  NA 142 --  K 2.4* --  CL 116* --  CO2 16* --  BUN 8 --  CREATININE 0.46* --  CALCIUM 6.5* --  PROT -- 6.7  BILITOT -- 0.8  ALKPHOS -- 87  ALT -- 30  AST -- 83*  GLUCOSE 214* --   No results found for this basename: CHOL, HDL, LDLCALC, TRIG   Lab Results  Component Value Date   DDIMER 3.16* 12/24/2010   Hemoglobin & Hematocrit   Hgb on recheck is 8.2 (Hg of 5 appears to be spurious)   Tele strips: 20:40 polymorphic VT vs torsades 20:51 complete heart block 21:13 VF  EKG 19:53 mild inf STE, ST dep V1, V2--> inf post STEMI   20:59 inf STEMI  22:087 sinus tach, Inf STEMI  Radiology/Studies:  Dg Chest 1 View  01/01/2011  *RADIOLOGY REPORT*  Clinical Data: Preoperative chest radiograph.  CHEST - 1 VIEW  Comparison: Chest radiograph performed 04/28/2010  Findings: The lungs are well-aerated and clear.  There is no evidence of focal opacification, pleural effusion or pneumothorax. Previously noted right hilar prominence has decreased in size, reflecting apparently normal vasculature.  The cardiomediastinal silhouette is within normal limits.  No acute osseous abnormalities are seen.  IMPRESSION: No acute cardiopulmonary process seen.  Original Report Authenticated By: Tonia Ghent, M.D.   Dg Chest 2 View  12/28/2010  *RADIOLOGY REPORT*  Clinical Data: Fevers, rule out pneumonia.  CHEST - 2 VIEW   Comparison: None  Findings: Two views of the chest were obtained.  The lungs are clear without airspace disease.  Prominent right hilar structures could be related to vessels but difficult to exclude lymphadenopathy, particularly on the lateral view. There is nodularity in the right hilar region on the lateral view.  Bony structures are intact.  IMPRESSION: Prominent right hilar structures as described.  An old comparison exam would be useful if available.  Otherwise, this area could be evaluated with a chest CT with IV contrast.  No focal airspace disease.  Original Report Authenticated By: Richarda Overlie, M.D.   Dg Femur Right  01/03/2011  *RADIOLOGY REPORT*  Clinical Data: Femur fracture.  RIGHT FEMUR - 2 VIEW  Comparison: 01/01/2011.  Findings: Six intraoperative C-arm views submitted for review after surgery.  This reveals placement of right femoral rod with proximal and distal fixation screws transfixing spiral fracture of the right femur with much better alignment.  Small fracture fragment separate from the major fracture fragment.  IMPRESSION: Open reduction and internal fixation of the right femur fracture.  Original Report Authenticated By: Fuller Canada, M.D.   Dg Femur Right  01/01/2011  *RADIOLOGY REPORT*  Clinical Data: Pain and swelling right thigh, deformity, patient states no known injury  RIGHT FEMUR - 2 VIEW  Comparison: None  Findings: Oblique distal right femoral diaphyseal fracture with apex posterior and lateral angulation. Lateral and posterior displacement noted as well. Hip and knee joint alignments appear grossly preserved. No additional fracture or dislocation identified. Visualized portion of right hemi pelvis appears intact. Osseous demineralization.  IMPRESSION: Displaced angulated distal right femoral diaphyseal fracture.  Patient transported to Emergency Department at conclusion of exam.  Original Report Authenticated By: Lollie Marrow, M.D.   US Venous Img Lower  Bilateral  12/25/2010  *RADIOLOGY REPORT*  Clinical Data: Right leg pain and edema.  BILATERAL LOWER EXTREMITY VENOUS DOPPLER ULTRASOUND  Technique: Gray-scale sonography with compression, as well as color and duplex ultrasound, were performed to evaluate the deep venous system from the level of the common femoral vein through the popliteal and proximal calf veins.  Comparison: None  Findings: On the right, there is  occlusive noncompressible thrombus in the femoral vein extending through the popliteal vein. Profunda femoris and common femoral veins remain patent. Noncompressible peroneal veins are noted.  On the left, normal compressibility of  the common femoral, superficial femoral, and popliteal veins, as well as the proximal calf veins.  No filling defects to suggest DVT on grayscale or color Doppler imaging.  Doppler waveforms show normal direction of venous flow, normal respiratory phasicity and response to augmentation.  IMPRESSION: 1.  Occlusive right femoropopliteal DVT. 2.  No evidence of left lower extremity deep vein thrombosis.  Original Report Authenticated By: Osa Craver, M.D.   US Venous Img Lower Unilateral Right  01/01/2011  *RADIOLOGY REPORT*  Clinical Data: Right leg pain and swelling.  History of femur fracture.  RIGHT LOWER EXTREMITY VENOUS DUPLEX ULTRASOUND  Technique:  Gray-scale sonography with graded compression, as well as color Doppler and duplex ultrasound, were performed to evaluate the deep venous system of the lower extremity from the level of the common femoral vein through the popliteal and proximal calf veins. Spectral Doppler was utilized to evaluate flow at rest and with distal augmentation maneuvers.  Comparison:  12/25/2010.  Findings: The common femoral, profunda femoral and upper superficial femoral veins are patent and compressible.  There is clot in the mid superficial femoral vein with partial occlusion and complete occlusion in the distal SFA and  popliteal veins. This appears stable.  IMPRESSION: Deep venous thrombosis in the superficial femoral and popliteal veins, unchanged since prior examination.  Original Report Authenticated By: P. Loralie Champagne, M.D.   Dg Chest Port 1 View  01/03/2011  *RADIOLOGY REPORT*  Clinical Data: Intubation.  PORTABLE CHEST - 1 VIEW 01/03/2011 2028 hours:  Comparison: Portable chest x-ray 01/01/2011 and two-view chest x- ray 12/28/2010 [facility].  Findings: Endotracheal tube tip in satisfactory position  approximately 3 cm above the carina.  Artifact overlying the left side of the upper chest mimics a pneumothorax.  No evidence of pneumothorax or pneumomediastinum.  Patchy airspace opacities at the right lung base.  Lungs otherwise clear.  Cardiac silhouette normal in size.  IMPRESSION:  1.  Endotracheal tube tip in satisfactory position approximately 3 cm above the carina. 2.  Atelectasis versus bronchopneumonia at the right lung base.  Original Report Authenticated By: Arnell Sieving, M.D.   Dg Femur Right Port  01/03/2011  *RADIOLOGY REPORT*  Clinical Data: Distal femur fracture.  PORTABLE RIGHT FEMUR - 2 VIEW  Comparison: 01/01/2011  Findings: Intermedullary rod with proximal and distal fixation screws are seen transfixing a distal femoral shaft fracture in near anatomic alignment.  Associated soft tissue swelling of the thigh noted.  IMPRESSION: Intermedullary rod fixation of distal femoral shaft fracture in near anatomic alignment.  Original Report Authenticated By: Danae Orleans, M.D.     Physical Exam: Blood pressure 115/84, pulse 85, temperature 99.9 F (37.7 C), temperature source Oral, resp. rate 26, height 5' (1.524 m), weight 49.896 kg (110 lb), SpO2 100.00%. General: Well developed, well nourished, in no acute distress. Head: Normocephalic, atraumatic, sclera non-icteric, no xanthomas, nares are without discharge.  Neck: Negative for carotid bruits. JVD not elevated. Lungs: Clear  bilaterally to auscultation without wheezes, rales, or rhonchi. Breathing is unlabored. Heart: RRR with S1 S2. No murmurs, rubs, or gallops appreciated. Abdomen: Soft, non-tender, non-distended with normoactive bowel sounds. No hepatomegaly. No rebound/guarding. No obvious abdominal masses. Msk:  Strength and tone appear normal for age. Extremities: No clubbing or cyanosis. No edema.  Distal pedal pulses are 2+ and equal bilaterally. Neuro: Alert and oriented X 3. Moves all extremities spontaneously. Psych:  Responds to questions appropriately with a normal affect.     Assessment and Plan:  Pt is a 65 yo woman with HTN, HLD and recent dx of DVT who presented 2 days ago s/p fall with femur fracture s/p ORIF today.  This evening, pt had cardiac arrest with multiple arrhythmias including Torsades, VT, VF, AF, transient CHB.  EKG shows Inf STEMI.  Recommendations:  1. Inf STEMI - activated CODE STEMI, Dr. Eldridge Dace notified.  Discussed risks and benefits of cath and PCI with pt's daughter [name], who gave consent for the procedure.  Give aspirin 300 mg PR stat now.  Start aspirin 81 mg daily starting tomorrow.  Start lipitor 80 mg qhs given ACS.  Check fasting lipids in am.   Hold on BB until pt is hemodynamically stable      2. Bedside echo with inf WMA and EF 25-30%.  Formal echo in am (ordered).  She is not in overt failure at this time.  Once stable, will need to start evidence based HF therapies (ACEi, BB).  Consider swan ganz catheter placement for further monitoring and assessment of hemodynamics.    3. Multiple arrhythmias and cardiac arrest - ?VT, AF with RVR and hemodynamic instablility s/p DCCV, torsades s/p shock, VF - pt was given 300 IV amio.  Please continue amio gtt for now.  Check baseline LFTs, TFTs.  She will need PFTs and ophtho exam at some point.  CXR obtained today.  Pt will be on heparin for h/o DVT and this should be continued for now while pt is having multiple  arrhythmias.  SHe is responsive (eyes open and moving extremities), so is not a candidate for therapeutic hypothermia.  Per report, pt was not down  for significant amount of time.   4. Anemia - transfuse for Hgb goal >10 given active ischemia.  Monitor serial Hgb given need for antiplatelet agents and anticoagulation in the acute post op period  5. Hypokalemia - keep K>4, Mag >2.  Monitor closely given arrhythmias  6. R femur fx s/o ORIF today - talked to Dr. Carola Frost over the phone re: anticoagulation - he is ok with aspirin, plavix, heparin, any anticoagulation needed for cath and STEMI.  Pt may have had a hematoma at presentation and if this gets worse, it can be drained.  [facility] will continue to follow.  Please page with any questions  Thank you for this consult  Signed, Hilary Hertz MD 01/03/2011, 9:32 PM  ADDENDUM:  EXAM ABOVE IS INCORRECT - ORIGINAL NOTE WAS SIGNED BEFORE EXAM WAS EDITED.  EXAM BELOW IS ACCURATE: Gen- intubated.  Eyes open. Heent - sclera anicteric Neck- no JVD CV- tachy, no m, no S3 Lungs- mechanical BS bilat Abd- soft, NTND Extr- no edema. R leg wrapped in bandage Skin- no rash   ADDENDUM: Cath showed subtotal occluded RCA s/p BMS Pt was plavix loaded and given angiomax She was hemodynamically unstable during cath, pressors uptitrated Pt with worsening mental status, less responsive - recommend checking stat head CT to r/o bleed Pt has fluids running wide open.  LV gram during cath confirmed EF 25%.  Recommend dc fluids. Pt should be continued on aspirin 81 and plavix 75 mg daily starting tomorrow  Above recommendations relayed to PCCM, Dr. Synetta Fail

## 2011-01-03 NOTE — Progress Notes (Signed)
18 beat run VT, Dr. Isidoro Donning notified, no new orders received, pt asymptomatic with VSS, telemetry strip flagged in chart.  Will continue to monitor closely. [address] Filter

## 2011-01-03 NOTE — Progress Notes (Signed)
[name]  NWG:[id number]  DOB: 20-May-1945  DOA: 01/01/2011  Subjective: - Status post intramedullary nailing of the right femur fracture today. Patient stable, complaining of pain.  Objective: Weight change:   Intake/Output Summary (Last 24 hours) at 01/03/11 1543 Last data filed at 01/03/11 1322  Gross per 24 hour  Intake   1650 ml  Output   1200 ml  Net    450 ml   Blood pressure 102/89, pulse 121, temperature 99.9 F (37.7 C), temperature source Oral, resp. rate 16, height 5' (1.524 m), weight 49.896 kg (110 lb), SpO2 99.00%.  Physical Exam: General: Alert and awake, oriented,  not in any acute distress. HEENT: anicteric sclera, pupils reactive to light and accommodation, EOMI CVS: S1-S2 clear, no murmur rubs or gallops Chest: clear to auscultation bilaterally, no wheezing, rales or rhonchi Abdomen: soft nontender, nondistended, normal bowel sounds, no organomegaly Extremities: Right lower extremity dressing intact Neuro: no new focal neurological deficits  Lab Results: Basic Metabolic Panel:  Lab 01/03/11 5784 01/02/11 0900  NA 140 136  K 3.7 3.7  CL 105 100  CO2 27 26  GLUCOSE 99 104*  BUN 12 13  CREATININE 0.47* 0.47*  CALCIUM 9.2 8.5  MG -- --  PHOS -- --   Liver Function Tests:  Lab 01/01/11 1645  AST 83*  ALT 30  ALKPHOS 87  BILITOT 0.8  PROT 6.7  ALBUMIN 2.3*   CBC:  Lab 01/03/11 0550 01/02/11 0900 01/01/11 1645  WBC 8.1 6.8 --  NEUTROABS -- -- 5.5  HGB 9.2* 9.4* --  HCT 27.7* 28.3* --  MCV 82.9 82.3 --  PLT 450* 388 --    Micro Results: Recent Results (from the past 240 hour(s))  URINE CULTURE     Status: Normal   Collection Time   12/27/10  3:43 PM      Component Value Range Status Comment   Specimen Description URINE, CATHETERIZED   Final    Special Requests NONE   Final    Setup Time [id number]   Final    Colony Count NO GROWTH   Final    Culture NO GROWTH   Final    Report Status 12/29/2010 FINAL   Final   CULTURE,  BLOOD (ROUTINE X 2)     Status: Normal   Collection Time   12/28/10 12:27 AM      Component Value Range Status Comment   Specimen Description BLOOD RIGHT HAND   Final    Special Requests BOTTLES DRAWN AEROBIC AND ANAEROBIC 6CC   Final    Culture NO GROWTH 5 DAYS   Final    Report Status 01/02/2011 FINAL   Final   CULTURE, BLOOD (ROUTINE X 2)     Status: Normal   Collection Time   12/28/10 12:31 AM      Component Value Range Status Comment   Specimen Description BLOOD LEFT ARM   Final    Special Requests BOTTLES DRAWN AEROBIC AND ANAEROBIC 6CC   Final    Culture NO GROWTH 5 DAYS   Final    Report Status 01/02/2011 FINAL   Final   MRSA PCR SCREENING     Status: Normal   Collection Time   01/02/11  7:46 PM      Component Value Range Status Comment   MRSA by PCR NEGATIVE  NEGATIVE  Final     Studies/Results: Dg Chest 1 View  01/01/2011  *RADIOLOGY REPORT*  Clinical Data: Preoperative chest radiograph.  CHEST -  1 VIEW  Comparison: Chest radiograph performed 04/28/2010  Findings: The lungs are well-aerated and clear.  There is no evidence of focal opacification, pleural effusion or pneumothorax. Previously noted right hilar prominence has decreased in size, reflecting apparently normal vasculature.  The cardiomediastinal silhouette is within normal limits.  No acute osseous abnormalities are seen.  IMPRESSION: No acute cardiopulmonary process seen.  Original Report Authenticated By: Tonia Ghent, M.D.   Dg Chest 2 View  12/28/2010  *RADIOLOGY REPORT*  Clinical Data: Fevers, rule out pneumonia.  CHEST - 2 VIEW  Comparison: None  Findings: Two views of the chest were obtained.  The lungs are clear without airspace disease.  Prominent right hilar structures could be related to vessels but difficult to exclude lymphadenopathy, particularly on the lateral view. There is nodularity in the right hilar region on the lateral view.  Bony structures are intact.  IMPRESSION: Prominent right hilar structures  as described.  An old comparison exam would be useful if available.  Otherwise, this area could be evaluated with a chest CT with IV contrast.  No focal airspace disease.  Original Report Authenticated By: Richarda Overlie, M.D.   Dg Femur Right  01/01/2011  *RADIOLOGY REPORT*  Clinical Data: Pain and swelling right thigh, deformity, patient states no known injury  RIGHT FEMUR - 2 VIEW  Comparison: None  Findings: Oblique distal right femoral diaphyseal fracture with apex posterior and lateral angulation. Lateral and posterior displacement noted as well. Hip and knee joint alignments appear grossly preserved. No additional fracture or dislocation identified. Visualized portion of right hemi pelvis appears intact. Osseous demineralization.  IMPRESSION: Displaced angulated distal right femoral diaphyseal fracture.  Patient transported to Emergency Department at conclusion of exam.  Original Report Authenticated By: Lollie Marrow, M.D.   US Venous Img Lower Bilateral  12/25/2010  *RADIOLOGY REPORT*  Clinical Data: Right leg pain and edema.  BILATERAL LOWER EXTREMITY VENOUS DOPPLER ULTRASOUND  Technique: Gray-scale sonography with compression, as well as color and duplex ultrasound, were performed to evaluate the deep venous system from the level of the common femoral vein through the popliteal and proximal calf veins.  Comparison: None  Findings: On the right, there is  occlusive noncompressible thrombus in the femoral vein extending through the popliteal vein. Profunda femoris and common femoral veins remain patent. Noncompressible peroneal veins are noted.  On the left, normal compressibility of  the common femoral, superficial femoral, and popliteal veins, as well as the proximal calf veins.  No filling defects to suggest DVT on grayscale or color Doppler imaging.  Doppler waveforms show normal direction of venous flow, normal respiratory phasicity and response to augmentation.  IMPRESSION: 1.  Occlusive right  femoropopliteal DVT. 2.  No evidence of left lower extremity deep vein thrombosis.  Original Report Authenticated By: Osa Craver, M.D.   US Venous Img Lower Unilateral Right  01/01/2011  *RADIOLOGY REPORT*  Clinical Data: Right leg pain and swelling.  History of femur fracture.  RIGHT LOWER EXTREMITY VENOUS DUPLEX ULTRASOUND  Technique:  Gray-scale sonography with graded compression, as well as color Doppler and duplex ultrasound, were performed to evaluate the deep venous system of the lower extremity from the level of the common femoral vein through the popliteal and proximal calf veins. Spectral Doppler was utilized to evaluate flow at rest and with distal augmentation maneuvers.  Comparison:  12/25/2010.  Findings: The common femoral, profunda femoral and upper superficial femoral veins are patent and compressible.  There is clot in the  mid superficial femoral vein with partial occlusion and complete occlusion in the distal SFA and popliteal veins. This appears stable.  IMPRESSION: Deep venous thrombosis in the superficial femoral and popliteal veins, unchanged since prior examination.  Original Report Authenticated By: P. Loralie Champagne, M.D.    Medications: Scheduled Meds:    . atenolol  50 mg Oral BID  . calcium-vitamin D  1 tablet Oral BID  . ceFAZolin (ANCEF) IV  1 g Intravenous Once  . ceFAZolin (ANCEF) IV  1 g Intravenous Q6H  . cyanocobalamin  1,000 mcg Intramuscular Weekly  . docusate sodium  100 mg Oral BID  . HYDROmorphone      . polysaccharide iron  150 mg Oral Daily  . traZODone  50 mg Oral QHS   Continuous Infusions:    . heparin 900 Units/hr (01/03/11 0259)  . heparin       Assessment/Plan: Principal Problem:  *Femur fracture, right - Status post right intramedullary femoral nailing today, appreciate orthopedics assistance - Continue pain control, given active DVT with large clot burden, continue heparin drip with no bolus until INR therapeutic.  Active  Problems:  HTN (hypertension): Borderline hypotensive, continue gentle hydration today   Femoral DVT (deep venous thrombosis): Restart heparin drip. Due to the surgery today, question if delay starting Coumadin till tomorrow to reduce the risk of bleeding postop.    Hyponatremia: Resolved   Vitamin B12 deficiency (dietary) anemia: Continue placement   Osteoporosis: Fall precautions, calcium vitamin D supplementation, PT OT when okayed by orthopedics. Patient will require skilled nursing facility for rehabilitation.    LOS: 2 days   RAI,RIPUDEEP 01/03/2011, 3:43 PM

## 2011-01-03 NOTE — Progress Notes (Signed)
Spoke w [employer] [phone] and pt wants to talk w her mother over the weekend. She may hire 24hr care w [facility] vs back to snf. Da will let us know pt and her decision for disch on Monday.

## 2011-01-03 NOTE — Anesthesia Postprocedure Evaluation (Signed)
Anesthesia Post Note  Patient: [name]  Procedure(s) Performed:  INTRAMEDULLARY (IM) RETROGRADE FEMORAL NAILING - Right IM Retrograde Femoral Nailing  Anesthesia type: General  Patient location: PACU  Post pain: Pain level controlled and Adequate analgesia  Post assessment: Post-op Vital signs reviewed, Patient's Cardiovascular Status Stable, Respiratory Function Stable, Patent Airway and Pain level controlled  Last Vitals:  Filed Vitals:   01/03/11 1140  BP:   Pulse: 90  Temp:   Resp: 16    Post vital signs: Reviewed and stable  Level of consciousness: awake, alert  and oriented  Complications: No apparent anesthesia complications

## 2011-01-03 NOTE — Procedures (Signed)
Intubation Procedure Note [name] [id number] 1945/06/17  Procedure: Intubation Indications: Airway protection and maintenance  Procedure Details Consent: Unable to obtain consent because of emergent medical necessity. Time Out: Verified patient identification, verified procedure, site/side was marked, verified correct patient position, special equipment/implants available, medications/allergies/relevent history reviewed, required imaging and test results available.  Performed  Maximum sterile technique was used including gloves and hand hygiene.  MAC and 3    Evaluation Hemodynamic Status:stable at the moment transported to unit for closer monitoring.; O2 sats: unable to obtain sats code situation Patient's Current Condition: stable Complications: No apparent complications Patient did tolerate procedure well. Chest X-ray ordered to verify placement.  CXR: pending. ABG    Component Value Date/Time   PHART 7.484* 01/03/2011 2000   PCO2ART 28.6* 01/03/2011 2000   PO2ART 411.0* 01/03/2011 2000   HCO3 21.3 01/03/2011 2000   TCO2 22.1 01/03/2011 2000   ACIDBASEDEF 1.7 01/03/2011 2000   O2SAT 100.0 01/03/2011 2000   Results post intubation times 10 minutes   Erskine Speed 01/03/2011

## 2011-01-03 NOTE — Anesthesia Preprocedure Evaluation (Addendum)
Anesthesia Evaluation  Patient identified by MRN, date of birth, ID band Patient awake    Reviewed: Allergy & Precautions, H&P , NPO status , Patient's Chart, lab work & pertinent test results, reviewed documented beta blocker date and time   Airway Mallampati: II TM Distance: >3 FB Neck ROM: full    Dental  (+) Edentulous Upper, Poor Dentition and Partial Lower   Pulmonary          Cardiovascular hypertension, Pt. on medications and Pt. on home beta blockers     Neuro/Psych    GI/Hepatic   Endo/Other    Renal/GU      Musculoskeletal  (+) Arthritis -,   Abdominal   Peds  Hematology   Anesthesia Other Findings   Reproductive/Obstetrics                         Anesthesia Physical Anesthesia Plan  ASA: II  Anesthesia Plan: General   Post-op Pain Management:    Induction: Intravenous  Airway Management Planned: Oral ETT  Additional Equipment:   Intra-op Plan:   Post-operative Plan:   Informed Consent: I have reviewed the patients History and Physical, chart, labs and discussed the procedure including the risks, benefits and alternatives for the proposed anesthesia with the patient or authorized representative who has indicated his/her understanding and acceptance.     Plan Discussed with: CRNA and Surgeon  Anesthesia Plan Comments:         Anesthesia Quick Evaluation

## 2011-01-03 NOTE — Progress Notes (Signed)
MD on call was called about pt not having an order for a skin prep or Hibiclens. MD stated to call [phone], the orthopedics unit and ask them because he was not sure. Spoke to charge nurse, [place] of [facility] and she stated that if there was not an order, that it would be done in the OR, so skin prep was not done on patient this am.

## 2011-01-03 NOTE — Progress Notes (Signed)
Reviewed current medications, no major interactions identified with new order for amiodarone.  Vernard Gambles, PharmD, BCPS 01/03/2011 11:19 PM

## 2011-01-03 NOTE — Consult Note (Signed)
[name] is an 65 y.o. female. ZOX:[id number]   Chief Complaint: Right femur frx  HPI: Please refer to Dr. Veda Canning note, which was reviewed. Agree to assume care from Dr. Ave Filter.  Past Medical History  Diagnosis Date  . Hypertension   . 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 Vitamin B12 deficiency (dietary) anemia 12/27/2010  . Arthritis     Past Surgical History  Procedure Date  . Abdominal hysterectomy     06/2010    History reviewed. No pertinent family history. Social History:  reports that she has never smoked. She has never used smokeless tobacco. She reports that she does not drink alcohol or use illicit drugs.  Allergies: No Known Allergies  Medications Prior to Admission  Medication Dose Route Frequency Provider Last Rate Last Dose  . acetaminophen (TYLENOL) tablet 650 mg  650 mg Oral Q6H PRN Srikar A Reddy   650 mg at 01/02/11 2005  . atenolol (TENORMIN) tablet 50 mg  50 mg Oral BID Srikar A Reddy   50 mg at 01/02/11 1847  . calcium-vitamin D (OSCAL WITH D) 500-200 MG-UNIT per tablet 1 tablet  1 tablet Oral BID Srikar A Reddy   1 tablet at 01/02/11 2220  . ceFAZolin (ANCEF) IVPB 1 g/50 mL premix  1 g Intravenous Once Mearl Latin, PA      . cyanocobalamin ((VITAMIN B-12)) injection 1,000 mcg  1,000 mcg Intramuscular Weekly Srikar A Reddy      . dextrose 5 % and 0.9 % NaCl with KCl 20 mEq/L infusion   Intravenous Continuous Rachal A David 75 mL/hr at 01/02/11 0500    . docusate sodium (COLACE) capsule 100 mg  100 mg Oral BID Rachal A David   100 mg at 01/02/11 2220  . heparin ADULT infusion 100 units/ml (25000 units/250 ml)  900 Units/hr Intravenous Continuous Janice Coffin, RPH 9 mL/hr at 01/03/11 0259 900 Units/hr at 01/03/11 0259  . morphine 2 MG/ML injection 1 mg  1 mg Intravenous Q2H PRN Rachal A David      . ondansetron (ZOFRAN) tablet 4 mg  4 mg Oral Q6H PRN Rachal A David       Or  . ondansetron (ZOFRAN) injection 4 mg  4 mg  Intravenous Q6H PRN Rachal A David      . phytonadione (VITAMIN K) 5 mg in dextrose 5 % 50 mL IVPB  5 mg Intravenous Once Ripudeep K Rai, MD   5 mg at 01/02/11 1200  . polysaccharide iron (NIFEREX) capsule 150 mg  150 mg Oral Daily Srikar A Reddy   150 mg at 01/02/11 1846  . traZODone (DESYREL) tablet 50 mg  50 mg Oral QHS Rachal A David   50 mg at 01/02/11 2220   Medications Prior to Admission  Medication Sig Dispense Refill  . acetaminophen (TYLENOL) 325 MG tablet Take 2 tablets (650 mg total) by mouth every 6 (six) hours as needed (or Fever >/= 101).  30 tablet    . atenolol (TENORMIN) 50 MG tablet Take 50 mg by mouth 2 (two) times daily.        . Calcium Carbonate-Vitamin D ([employer] CALCIUM 600 + D) 600-400 MG-UNIT per tablet Take 1 tablet by mouth 2 (two) times daily.        . cyanocobalamin (,VITAMIN B-12,) 1000 MCG/ML injection Inject 1 mL (1,000 mcg total) into the muscle once a week.  1 mL    . docusate sodium 100 MG CAPS Take 100 mg  by mouth 2 (two) times daily.  10 capsule    . HYDROcodone-acetaminophen (NORCO) 5-325 MG per tablet Take 1 tablet by mouth every 6 (six) hours as needed for pain.  20 tablet  0  . polysaccharide iron (NIFEREX) 150 MG CAPS capsule Take 1 capsule (150 mg total) by mouth daily.  30 each    . traZODone (DESYREL) 50 MG tablet Take 50 mg by mouth at bedtime. For insomnia      . warfarin (COUMADIN) 5 MG tablet Take 1 tablet (5 mg total) by mouth at bedtime.        Results for orders placed during the hospital encounter of 01/01/11 (from the past 48 hour(s))  CBC     Status: Abnormal   Collection Time   01/01/11  4:45 PM      Component Value Range Comment   WBC 7.6  4.0 - 10.5 (K/uL)    RBC 4.04  3.87 - 5.11 (MIL/uL)    Hemoglobin 11.1 (*) 12.0 - 15.0 (g/dL)    HCT 16.1 (*) 09.6 - 46.0 (%)    MCV 82.2  78.0 - 100.0 (fL)    MCH 27.5  26.0 - 34.0 (pg)    MCHC 33.4  30.0 - 36.0 (g/dL)    RDW 04.5 (*) 40.9 - 15.5 (%)    Platelets 406 (*) 150 - 400 (K/uL)     DIFFERENTIAL     Status: Normal   Collection Time   01/01/11  4:45 PM      Component Value Range Comment   Neutrophils Relative 72  43 - 77 (%)    Neutro Abs 5.5  1.7 - 7.7 (K/uL)    Lymphocytes Relative 15  12 - 46 (%)    Lymphs Abs 1.1  0.7 - 4.0 (K/uL)    Monocytes Relative 11  3 - 12 (%)    Monocytes Absolute 0.8  0.1 - 1.0 (K/uL)    Eosinophils Relative 2  0 - 5 (%)    Eosinophils Absolute 0.2  0.0 - 0.7 (K/uL)    Basophils Relative 0  0 - 1 (%)    Basophils Absolute 0.0  0.0 - 0.1 (K/uL)   PROTIME-INR     Status: Abnormal   Collection Time   01/01/11  4:45 PM      Component Value Range Comment   Prothrombin Time 39.6 (*) 11.6 - 15.2 (seconds)    INR 4.00 (*) 0.00 - 1.49    TYPE AND SCREEN     Status: Normal   Collection Time   01/01/11  4:45 PM      Component Value Range Comment   ABO/RH(D) O NEG      Antibody Screen NEG      Sample Expiration 01/04/2011     COMPREHENSIVE METABOLIC PANEL     Status: Abnormal   Collection Time   01/01/11  4:45 PM      Component Value Range Comment   Sodium 131 (*) 135 - 145 (mEq/L)    Potassium 4.0  3.5 - 5.1 (mEq/L)    Chloride 92 (*) 96 - 112 (mEq/L)    CO2 29  19 - 32 (mEq/L)    Glucose, Bld 111 (*) 70 - 99 (mg/dL)    BUN 17  6 - 23 (mg/dL)    Creatinine, Ser 8.11  0.50 - 1.10 (mg/dL)    Calcium 9.5  8.4 - 10.5 (mg/dL)    Total Protein 6.7  6.0 - 8.3 (g/dL)  Albumin 2.3 (*) 3.5 - 5.2 (g/dL)    AST 83 (*) 0 - 37 (U/L)    ALT 30  0 - 35 (U/L)    Alkaline Phosphatase 87  39 - 117 (U/L)    Total Bilirubin 0.8  0.3 - 1.2 (mg/dL)    GFR calc non Af Amer >90  >90 (mL/min)    GFR calc Af Amer >90  >90 (mL/min)   BASIC METABOLIC PANEL     Status: Abnormal   Collection Time   01/02/11  9:00 AM      Component Value Range Comment   Sodium 136  135 - 145 (mEq/L)    Potassium 3.7  3.5 - 5.1 (mEq/L)    Chloride 100  96 - 112 (mEq/L)    CO2 26  19 - 32 (mEq/L)    Glucose, Bld 104 (*) 70 - 99 (mg/dL)    BUN 13  6 - 23 (mg/dL)     Creatinine, Ser 4.09 (*) 0.50 - 1.10 (mg/dL)    Calcium 8.5  8.4 - 10.5 (mg/dL)    GFR calc non Af Amer >90  >90 (mL/min)    GFR calc Af Amer >90  >90 (mL/min)   CBC     Status: Abnormal   Collection Time   01/02/11  9:00 AM      Component Value Range Comment   WBC 6.8  4.0 - 10.5 (K/uL)    RBC 3.44 (*) 3.87 - 5.11 (MIL/uL)    Hemoglobin 9.4 (*) 12.0 - 15.0 (g/dL)    HCT 81.1 (*) 91.4 - 46.0 (%)    MCV 82.3  78.0 - 100.0 (fL)    MCH 27.3  26.0 - 34.0 (pg)    MCHC 33.2  30.0 - 36.0 (g/dL)    RDW 78.2 (*) 95.6 - 15.5 (%)    Platelets 388  150 - 400 (K/uL)   PROTIME-INR     Status: Abnormal   Collection Time   01/02/11  9:00 AM      Component Value Range Comment   Prothrombin Time 33.1 (*) 11.6 - 15.2 (seconds)    INR 3.18 (*) 0.00 - 1.49    PROTIME-INR     Status: Abnormal   Collection Time   01/02/11  3:41 PM      Component Value Range Comment   Prothrombin Time 18.9 (*) 11.6 - 15.2 (seconds)    INR 1.55 (*) 0.00 - 1.49    MRSA PCR SCREENING     Status: Normal   Collection Time   01/02/11  7:46 PM      Component Value Range Comment   MRSA by PCR NEGATIVE  NEGATIVE    HEPARIN LEVEL (UNFRACTIONATED)     Status: Abnormal   Collection Time   01/03/11 12:12 AM      Component Value Range Comment   Heparin Unfractionated 0.25 (*) 0.30 - 0.70 (IU/mL)   CBC     Status: Abnormal   Collection Time   01/03/11  5:50 AM      Component Value Range Comment   WBC 8.1  4.0 - 10.5 (K/uL)    RBC 3.34 (*) 3.87 - 5.11 (MIL/uL)    Hemoglobin 9.2 (*) 12.0 - 15.0 (g/dL)    HCT 21.3 (*) 08.6 - 46.0 (%)    MCV 82.9  78.0 - 100.0 (fL)    MCH 27.5  26.0 - 34.0 (pg)    MCHC 33.2  30.0 - 36.0 (g/dL)    RDW 57.8 (*)  11.5 - 15.5 (%)    Platelets 450 (*) 150 - 400 (K/uL)   BASIC METABOLIC PANEL     Status: Abnormal   Collection Time   01/03/11  5:50 AM      Component Value Range Comment   Sodium 140  135 - 145 (mEq/L)    Potassium 3.7  3.5 - 5.1 (mEq/L)    Chloride 105  96 - 112 (mEq/L)    CO2 27   19 - 32 (mEq/L)    Glucose, Bld 99  70 - 99 (mg/dL)    BUN 12  6 - 23 (mg/dL)    Creatinine, Ser 1.61 (*) 0.50 - 1.10 (mg/dL)    Calcium 9.2  8.4 - 10.5 (mg/dL)    GFR calc non Af Amer >90  >90 (mL/min)    GFR calc Af Amer >90  >90 (mL/min)   PROTIME-INR     Status: Abnormal   Collection Time   01/03/11  5:50 AM      Component Value Range Comment   Prothrombin Time 15.3 (*) 11.6 - 15.2 (seconds)    INR 1.18  0.00 - 1.49     Dg Chest 1 View  01/01/2011  *RADIOLOGY REPORT*  Clinical Data: Preoperative chest radiograph.  CHEST - 1 VIEW  Comparison: Chest radiograph performed 04/28/2010  Findings: The lungs are well-aerated and clear.  There is no evidence of focal opacification, pleural effusion or pneumothorax. Previously noted right hilar prominence has decreased in size, reflecting apparently normal vasculature.  The cardiomediastinal silhouette is within normal limits.  No acute osseous abnormalities are seen.  IMPRESSION: No acute cardiopulmonary process seen.  Original Report Authenticated By: Tonia Ghent, M.D.   Dg Femur Right  01/01/2011  *RADIOLOGY REPORT*  Clinical Data: Pain and swelling right thigh, deformity, patient states no known injury  RIGHT FEMUR - 2 VIEW  Comparison: None  Findings: Oblique distal right femoral diaphyseal fracture with apex posterior and lateral angulation. Lateral and posterior displacement noted as well. Hip and knee joint alignments appear grossly preserved. No additional fracture or dislocation identified. Visualized portion of right hemi pelvis appears intact. Osseous demineralization.  IMPRESSION: Displaced angulated distal right femoral diaphyseal fracture.  Patient transported to Emergency Department at conclusion of exam.  Original Report Authenticated By: Lollie Marrow, M.D.   US Venous Img Lower Unilateral Right  01/01/2011  *RADIOLOGY REPORT*  Clinical Data: Right leg pain and swelling.  History of femur fracture.  RIGHT LOWER EXTREMITY VENOUS  DUPLEX ULTRASOUND  Technique:  Gray-scale sonography with graded compression, as well as color Doppler and duplex ultrasound, were performed to evaluate the deep venous system of the lower extremity from the level of the common femoral vein through the popliteal and proximal calf veins. Spectral Doppler was utilized to evaluate flow at rest and with distal augmentation maneuvers.  Comparison:  12/25/2010.  Findings: The common femoral, profunda femoral and upper superficial femoral veins are patent and compressible.  There is clot in the mid superficial femoral vein with partial occlusion and complete occlusion in the distal SFA and popliteal veins. This appears stable.  IMPRESSION: Deep venous thrombosis in the superficial femoral and popliteal veins, unchanged since prior examination.  Original Report Authenticated By: P. Loralie Champagne, M.D.   Blood pressure 92/59, pulse 71, temperature 97.9 F (36.6 C), temperature source Oral, resp. rate 18, height 5' (1.524 m), weight 49.896 kg (110 lb), SpO2 95.00%. Patient has been re-examined  -No change in the plan of care  Assessment/Plan -  IM nailing of left femur I discussed with the patient the risks and benefits of surgery, including the possibility of infection, nerve injury, vessel injury, wound breakdown, arthritis, symptomatic hardware, DVT/ PE, loss of motion, and need for further surgery among others for malunion, nonunion.  She understands these risks and wishes to proceed.  Garhett Bernhard H

## 2011-01-04 ENCOUNTER — Encounter (HOSPITAL_COMMUNITY): Payer: Self-pay

## 2011-01-04 ENCOUNTER — Inpatient Hospital Stay (HOSPITAL_COMMUNITY): Payer: Medicare Other

## 2011-01-04 ENCOUNTER — Encounter (HOSPITAL_COMMUNITY)
Admission: EM | Disposition: A | Discharge status: Skilled Nursing Facility | Payer: Self-pay | Source: Home / Self Care | Attending: Pulmonary Disease

## 2011-01-04 DIAGNOSIS — I359 Nonrheumatic aortic valve disorder, unspecified: Secondary | ICD-10-CM

## 2011-01-04 HISTORY — PX: LEFT HEART CATHETERIZATION WITH CORONARY ANGIOGRAM: SHX5451

## 2011-01-04 LAB — CBC
HCT: 35.1 % — ABNORMAL LOW (ref 36.0–46.0)
Hemoglobin: 12.4 g/dL (ref 12.0–15.0)
MCH: 29 pg (ref 26.0–34.0)
MCHC: 35.3 g/dL (ref 30.0–36.0)
MCV: 82 fL (ref 78.0–100.0)
Platelets: 386 10*3/uL (ref 150–400)
RBC: 4.28 MIL/uL (ref 3.87–5.11)
RDW: 16.2 % — ABNORMAL HIGH (ref 11.5–15.5)
WBC: 14.8 10*3/uL — ABNORMAL HIGH (ref 4.0–10.5)

## 2011-01-04 LAB — PHOSPHORUS: Phosphorus: 4.3 mg/dL (ref 2.3–4.6)

## 2011-01-04 LAB — HEPARIN LEVEL (UNFRACTIONATED): Heparin Unfractionated: 0.27 IU/mL — ABNORMAL LOW (ref 0.30–0.70)

## 2011-01-04 LAB — TSH: TSH: 0.364 u[IU]/mL (ref 0.350–4.500)

## 2011-01-04 LAB — CARDIAC PANEL(CRET KIN+CKTOT+MB+TROPI)
CK, MB: 184.5 ng/mL (ref 0.3–4.0)
Total CK: 1735 U/L — ABNORMAL HIGH (ref 7–177)
Troponin I: >25 ng/mL (ref <0.30)
Troponin I: >25 ng/mL (ref <0.30)

## 2011-01-04 LAB — BASIC METABOLIC PANEL
BUN: 10 mg/dL (ref 6–23)
CO2: 25 mEq/L (ref 19–32)
Calcium: 9.5 mg/dL (ref 8.4–10.5)
Chloride: 102 mEq/L (ref 96–112)
Creatinine, Ser: 0.57 mg/dL (ref 0.50–1.10)
GFR calc Af Amer: >90 mL/min (ref >90)
GFR calc non Af Amer: >90 mL/min (ref >90)
Glucose, Bld: 185 mg/dL — ABNORMAL HIGH (ref 70–99)
Potassium: 3.5 mEq/L (ref 3.5–5.1)
Sodium: 136 mEq/L (ref 135–145)

## 2011-01-04 LAB — PROTIME-INR: INR: 1.62 — ABNORMAL HIGH (ref 0.00–1.49)

## 2011-01-04 LAB — HEPATIC FUNCTION PANEL
ALT: 65 U/L — ABNORMAL HIGH (ref 0–35)
Bilirubin, Direct: 0.4 mg/dL — ABNORMAL HIGH (ref 0.0–0.3)
Indirect Bilirubin: 0.6 mg/dL (ref 0.3–0.9)

## 2011-01-04 LAB — MAGNESIUM: Magnesium: 2.5 mg/dL (ref 1.5–2.5)

## 2011-01-04 LAB — POCT I-STAT 3, ART BLOOD GAS (G3+)
Acid-Base Excess: 2 mmol/L (ref 0.0–2.0)
Bicarbonate: 25 mEq/L — ABNORMAL HIGH (ref 20.0–24.0)
O2 Saturation: 100 %
Patient temperature: 97.1
TCO2: 26 mmol/L (ref 0–100)

## 2011-01-04 SURGERY — LEFT HEART CATHETERIZATION WITH CORONARY ANGIOGRAM
Anesthesia: LOCAL

## 2011-01-04 MED ORDER — ATROPINE SULFATE 1 MG/ML IJ SOLN
INTRAMUSCULAR | Status: AC
  Filled 2011-01-04: qty 1

## 2011-01-04 MED ORDER — HEPARIN SOD (PORCINE) IN D5W 100 UNIT/ML IV SOLN
900.0000 [IU]/h | INTRAVENOUS | Status: DC
  Administered 2011-01-04 – 2011-01-07 (×4): 900 [IU]/h via INTRAVENOUS
  Filled 2011-01-04 (×7): qty 250

## 2011-01-04 MED ORDER — CLOPIDOGREL BISULFATE 300 MG PO TABS
ORAL_TABLET | ORAL | Status: AC
  Filled 2011-01-04: qty 2

## 2011-01-04 MED ORDER — DEXTROSE 5 % IV SOLN
300.0000 mg | Freq: Once | INTRAVENOUS | Status: AC
  Administered 2011-01-03: 300 mg via INTRAVENOUS

## 2011-01-04 MED ORDER — CLOPIDOGREL BISULFATE 300 MG PO TABS
600.0000 mg | ORAL_TABLET | Freq: Once | ORAL | Status: AC
  Administered 2011-01-04: 600 mg via ORAL

## 2011-01-04 MED ORDER — ASPIRIN EC 325 MG PO TBEC
325.0000 mg | DELAYED_RELEASE_TABLET | Freq: Every day | ORAL | Status: DC
  Administered 2011-01-04: 325 mg via ORAL
  Filled 2011-01-04 (×2): qty 1

## 2011-01-04 MED ORDER — SODIUM CHLORIDE 0.9 % IV SOLN
0.2500 mg/kg/h | INTRAVENOUS | Status: AC
  Filled 2011-01-04: qty 250

## 2011-01-04 MED ORDER — AMIODARONE LOAD VIA INFUSION: 300.0000 mg | Freq: Once | INTRAVENOUS | Status: DC

## 2011-01-04 MED ORDER — CLOPIDOGREL BISULFATE 75 MG PO TABS
75.0000 mg | ORAL_TABLET | Freq: Every day | ORAL | Status: DC
  Administered 2011-01-04 – 2011-02-05 (×30): 75 mg via ORAL
  Filled 2011-01-04 (×39): qty 1

## 2011-01-04 MED ORDER — SODIUM CHLORIDE 0.9 % IV SOLN
INTRAVENOUS | Status: DC
  Administered 2011-01-06: 20 mL/h via INTRAVENOUS
  Administered 2011-01-07 – 2011-01-15 (×6): via INTRAVENOUS

## 2011-01-04 MED ORDER — ONDANSETRON HCL 4 MG/2ML IJ SOLN: 4.0000 mg | Freq: Four times a day (QID) | INTRAMUSCULAR | Status: DC | PRN

## 2011-01-04 NOTE — Progress Notes (Signed)
*  PRELIMINARY RESULTS* Echocardiogram 2D Echocardiogram has been performed.  Clide Deutscher RDCS 01/04/2011, 10:22 AM

## 2011-01-04 NOTE — Progress Notes (Addendum)
Name: [name] MRN: [id number] DOB: [birth year]    LOS: 3  PCCM Progress  NOTE  History of Present Illness:  Patient is a 65 y/o F with PMHx of HTN, DL who had a fall on 40/98/11, initially was taken to [facility] and was diagnosed with R femoral DVT and started on anticoagulation. Pt had persistent pain and she had further imaging studies which showed R femoral fracture. Pt was transferred to [facility] for further management and was taken to the OR on 01/03/11 and underwent Open reduction and internal fixation of the right femur fracture. Pt then had an episode of torsades on the floor and code blue was called. Pt was intubated , CPR was performed and received 1g of Mag. Pt was transferred to 2300 and developed an episode of A fib with RVR and the Vt tach. HR returned to 120's (A fib) and the became hypotensive. Pt was cardioverted and the started on Dopamine. Central line was placed on the L subclavian A and EKG showed ST elevation on the inferior leads.  Cardiology was consulted and patient will be taken to the cath lab.   Lines / Drains: L IJ CVL 12/7 ETT 12/7  Cultures: none  Antibiotics: none  Tests / Events: Pt went to cath lab and had bare metal stent placed into occluded RCA.  Pt slowly waking up now this am. [facility] had a Neg CT head 12/8    Past Medical History  Diagnosis Date  . Hypertension   . 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 Vitamin B12 deficiency (dietary) anemia 12/27/2010  . Arthritis    Past Surgical History  Procedure Date  . Abdominal hysterectomy     06/2010   Prior to Admission medications   Medication Sig Start Date End Date Taking? Authorizing Provider  acetaminophen (TYLENOL) 325 MG tablet Take 2 tablets (650 mg total) by mouth every 6 (six) hours as needed (or Fever >/= 101). 12/30/10 01/09/11 Yes Corinna L Sullivan  atenolol (TENORMIN) 50 MG tablet Take 50 mg by mouth 2 (two) times daily.     Yes Historical Provider, MD  Calcium  Carbonate-Vitamin D ([employer] CALCIUM 600 + D) 600-400 MG-UNIT per tablet Take 1 tablet by mouth 2 (two) times daily.     Yes Historical Provider, MD  cyanocobalamin (,VITAMIN B-12,) 1000 MCG/ML injection Inject 1 mL (1,000 mcg total) into the muscle once a week. 12/30/10 12/30/11 Yes Corinna L Sullivan  docusate sodium 100 MG CAPS Take 100 mg by mouth 2 (two) times daily. 12/30/10 01/09/11 Yes Corinna L Lendell Caprice  HYDROcodone-acetaminophen (NORCO) 5-325 MG per tablet Take 1 tablet by mouth every 6 (six) hours as needed for pain. 12/30/10 01/09/11 Yes Corinna L Sullivan  polysaccharide iron (NIFEREX) 150 MG CAPS capsule Take 1 capsule (150 mg total) by mouth daily. 12/30/10  Yes Corinna L Sullivan  traZODone (DESYREL) 50 MG tablet Take 50 mg by mouth at bedtime. For insomnia   Yes Historical Provider, MD  warfarin (COUMADIN) 5 MG tablet Take 1 tablet (5 mg total) by mouth at bedtime. 12/30/10 12/30/11 Yes Corinna L Lendell Caprice   Allergies No Known Allergies  Family History History reviewed. No pertinent family history.  Social History  reports that she has never smoked. She has never used smokeless tobacco. She reports that she does not drink alcohol or use illicit drugs.  Review Of Systems  11 points review of systems is negative with an exception of listed in HPI.  Vital Signs: Temp:  [97.1  F (36.2 C)-99.9 F (37.7 C)] 98 F (36.7 C) (12/08 0733) Pulse Rate:  [51-158] 103  (12/08 1000) Resp:  [13-31] 16  (12/08 1000) BP: (66-157)/(43-124) 107/78 mmHg (12/08 1000) SpO2:  [69 %-100 %] 98 % (12/08 1000) Arterial Line BP: (105-121)/(67-77) 109/67 mmHg (12/08 0600) FiO2 (%):  [40 %-100 %] 40 % (12/08 0850) Weight:  [51 kg (112 lb 7 oz)] 112 lb 7 oz (51 kg) (12/08 0500) I/O last 3 completed shifts: In: 2780 [P.O.:480; I.V.:1310; NG/GT:90; IV Piggyback:900] Out: 3245 [Urine:3095; Emesis/NG output:150]  Physical Examination: General:  ETT in placed ,sedated on vent Neuro:  Moves all 4s will open eyes  to voice   HEENT:  No jVD,  No TMG Neck:  supple   Cardiovascular:  RRR, nl s1/s2 no s3/s4 Lungs:  clear Abdomen:  Soft NT BS decreased Musculoskeletal:  FROM Skin:  clear  Ventilator settings: Vent Mode:  [-] PRVC FiO2 (%):  [40 %-100 %] 40 % Set Rate:  [15 bmp] 15 bmp Vt Set:  [400 mL] 400 mL PEEP:  [5 cmH20] 5 cmH20 Plateau Pressure:  [13 cmH20-14 cmH20] 14 cmH20  Labs and Imaging:  Reviewed.  Please refer to the Assessment and Plan section for relevant results.  Assessment and Plan: Patient is a 65 y/o F with PMHx of HTN, DL who was diagnosed with R femoral DVT, started on anticoagulation and R femoral fracture s/p Open reduction and internal fixation of the right femur fracture and on the post op period developed cardioresp arrest s/p CPR with cardiac arrhythmias (torsades, then afib with RVR and VT).  1. S/p cardirespiratory arrest, AMI d/t RCA closure s/p bare metal stent Acute respiratory failure Shock state is d/t cardiogenic shock -cont full vent support  -f/u echo -titrate vasopressors -per cardiology, start hep drip  2. A fib with RVR  - sp cardioversion  - amiod drip  3. R low extrem DVT  - Pt will be re started on heparin at the cath lab  - follow CBC   4. Anemia   Lab 01/04/11 0500 01/03/11 2157 01/03/11 2009 01/03/11 1038 01/03/11 0550  HCT 35.1* 25.5* 18.2* 27.0* 27.7*   -trend Hgb, tfx for Hgb < or equal to 9  5. Hypokalemia   Lab 01/04/11 0500 01/03/11 2157 01/03/11 2009 01/03/11 1038 01/03/11 0550  K 3.5 3.1* 2.4* >9.0* 3.7  repleted  6. DVT and GI prophyl;axis  - Hep drip and Protonix Iv   7. FEN   Lab 01/04/11 0500 01/03/11 2157 01/03/11 2009 01/03/11 1038 01/03/11 0550  NA 136 136 142 134* 140    - track BMET     Best practices / Disposition: -->ICU status under PCCM -->full code -->Heparin for DVT Px -->Protonix for GI Px -->ventilator bundle -->diet -->family updated at bedside  The patient is critically ill with  multiple organ systems failure and requires high complexity decision making for assessment and support, frequent evaluation and titration of therapies, application of advanced monitoring technologies and extensive interpretation of multiple databases. Critical Care Time devoted to patient care services described in this note is 40 minutes.  Shan Levans  M.D. Pulmonary and Critical Care Medicine [facility] Cell: [phone]   Pager: [phone] 01/04/2011, 10:40 AM

## 2011-01-04 NOTE — Progress Notes (Signed)
Subjective: Day of Surgery Procedure(s) (LRB): LEFT HEART CATHETERIZATION WITH CORONARY ANGIOGRAM (N/A) Patient reports pain as Patient intubated.    Objective: Vital signs in last 24 hours: Temp:  [97.1 F (36.2 C)-99.9 F (37.7 C)] 98 F (36.7 C) (12/08 0733) Pulse Rate:  [51-158] 94  (12/08 0843) Resp:  [13-31] 17  (12/08 0843) BP: (66-157)/(43-124) 104/72 mmHg (12/08 0843) SpO2:  [69 %-100 %] 99 % (12/08 0843) Arterial Line BP: (105-121)/(67-77) 109/67 mmHg (12/08 0600) FiO2 (%):  [40 %-100 %] 40 % (12/08 0850) Weight:  [51 kg (112 lb 7 oz)] 112 lb 7 oz (51 kg) (12/08 0500)  Intake/Output from previous day: 12/07 0701 - 12/08 0700 In: 2780 [P.O.:480; I.V.:1310; NG/GT:90; IV Piggyback:900] Out: 3245 [Urine:3095; Emesis/NG output:150] Intake/Output this shift: Total I/O In: 40 [I.V.:20; NG/GT:20] Out: 30 [Urine:30]   Basename 01/04/11 0500 01/03/11 2157 01/03/11 2009 01/03/11 1038 01/03/11 0550  HGB 12.4 8.2* 5.9* 9.2* 9.2*    Basename 01/04/11 0500 01/03/11 2157  WBC 14.8* 14.2*  RBC 4.28 3.02*  HCT 35.1* 25.5*  PLT 386 432*    Basename 01/04/11 0500 01/03/11 2157  NA 136 136  K 3.5 3.1*  CL 102 102  CO2 25 25  BUN 10 12  CREATININE 0.57 0.65  GLUCOSE 185* 292*  CALCIUM 9.5 11.2*    Basename 01/04/11 0500 01/03/11 2157  LABPT -- --  INR 1.62* 1.27    Compartment soft Patient intubated.  Assessment/Plan: Day of Surgery Procedure(s) (LRB): LEFT HEART CATHETERIZATION WITH CORONARY ANGIOGRAM (N/A)  Continue plan of care per primary service; will begin to ambulate once patient has stabilized.  Charlett Merkle,STEPHEN D 01/04/2011, 9:04 AM

## 2011-01-04 NOTE — Procedures (Signed)
Central Venous Catheter Insertion Procedure Note [name] [id number] 11/15/1945  Procedure: Insertion of Central Venous Catheter Indications: Assessment of intravascular volume, Drug and/or fluid administration and Frequent blood sampling  Procedure Details Consent: Unable to obtain consent because of emergent medical necessity. Time Out: Verified patient identification, verified procedure, site/side was marked, verified correct patient position, special equipment/implants available, medications/allergies/relevent history reviewed, required imaging and test results available.  Performed  Maximum sterile technique was used including antiseptics, cap, gloves, gown, hand hygiene, mask and sheet. Skin prep: Chlorhexidine; local anesthetic administered A antimicrobial bonded/coated triple lumen catheter was placed in the left internal jugular vein using the Seldinger technique.  Evaluation Blood flow good Complications: No apparent complications Patient did tolerate procedure well. Chest X-ray ordered to verify placement.  CXR: pending.  [name] 01/04/2011, 2:17 AM

## 2011-01-04 NOTE — Progress Notes (Signed)
SUBJECTIVE:  Intubated  OBJECTIVE:   Vitals:   Filed Vitals:   01/04/11 0900 01/04/11 1000 01/04/11 1100 01/04/11 1140  BP: 116/84 107/78 117/83   Pulse: 98 103 97   Temp:    97.7 F (36.5 C)  TempSrc:    Oral  Resp: 20 16 15    Height:      Weight:      SpO2: 100% 98% 99%    I&O's:   Intake/Output Summary (Last 24 hours) at 01/04/11 1233 Last data filed at 01/04/11 1100  Gross per 24 hour  Intake   1270 ml  Output   2705 ml  Net  -1435 ml   TELEMETRY: Reviewed telemetry pt in NSR:   PHYSICAL EXAM General: Well developed, well nourished intubated and sedated Lungs:   Clear bilaterally to auscultation anteriorly Heart:   HRRR S1 S2 Pulses are 2+ & equal.            No carotid bruit. No JVD.   Abdomen: Bowel sounds are positive, abdomen soft and non-tender without masses Extremities:   No clubbing, cyanosis or edema.  DP +1  LABS: Basic Metabolic Panel:  Basename 01/04/11 0500 01/03/11 2157  NA 136 136  K 3.5 3.1*  CL 102 102  CO2 25 25  GLUCOSE 185* 292*  BUN 10 12  CREATININE 0.57 0.65  CALCIUM 9.5 11.2*  MG 2.5 4.4*  PHOS 4.3 4.0   Liver Function Tests:  Basename 01/04/11 0500 01/01/11 1645  AST 251* 83*  ALT 65* 30  ALKPHOS 150* 87  BILITOT 1.0 0.8  PROT 5.7* 6.7  ALBUMIN 2.0* 2.3*    CBC:  Basename 01/04/11 0500 01/03/11 2157 01/01/11 1645  WBC 14.8* 14.2* --  NEUTROABS -- -- 5.5  HGB 12.4 8.2* --  HCT 35.1* 25.5* --  MCV 82.0 84.4 --  PLT 386 432* --   Cardiac Enzymes:  Basename 01/04/11 0500  CKTOTAL 1735*  CKMB 184.5*  CKMBINDEX --  TROPONINI >25.00*   Thyroid Function Tests:  Basename 01/04/11 0500  TSH 0.364  T4TOTAL --  T3FREE --  THYROIDAB --   Anemia Panel: No results found for this basename: VITAMINB12,FOLATE,FERRITIN,TIBC,IRON,RETICCTPCT in the last 72 hours Coag Panel:   Lab Results  Component Value Date   INR 1.62* 01/04/2011   INR 1.27 01/03/2011   INR 1.18 01/03/2011    RADIOLOGY: Dg Chest 1  View  01/01/2011  *RADIOLOGY REPORT*  Clinical Data: Preoperative chest radiograph.  CHEST - 1 VIEW  Comparison: Chest radiograph performed 04/28/2010  Findings: The lungs are well-aerated and clear.  There is no evidence of focal opacification, pleural effusion or pneumothorax. Previously noted right hilar prominence has decreased in size, reflecting apparently normal vasculature.  The cardiomediastinal silhouette is within normal limits.  No acute osseous abnormalities are seen.  IMPRESSION: No acute cardiopulmonary process seen.  Original Report Authenticated By: Tonia Ghent, M.D.   Dg Chest 2 View  12/28/2010  *RADIOLOGY REPORT*  Clinical Data: Fevers, rule out pneumonia.  CHEST - 2 VIEW  Comparison: None  Findings: Two views of the chest were obtained.  The lungs are clear without airspace disease.  Prominent right hilar structures could be related to vessels but difficult to exclude lymphadenopathy, particularly on the lateral view. There is nodularity in the right hilar region on the lateral view.  Bony structures are intact.  IMPRESSION: Prominent right hilar structures as described.  An old comparison exam would be useful if available.  Otherwise, this area could  be evaluated with a chest CT with IV contrast.  No focal airspace disease.  Original Report Authenticated By: Richarda Overlie, M.D.   Dg Femur Right  01/03/2011  *RADIOLOGY REPORT*  Clinical Data: Femur fracture.  RIGHT FEMUR - 2 VIEW  Comparison: 01/01/2011.  Findings: Six intraoperative C-arm views submitted for review after surgery.  This reveals placement of right femoral rod with proximal and distal fixation screws transfixing spiral fracture of the right femur with much better alignment.  Small fracture fragment separate from the major fracture fragment.  IMPRESSION: Open reduction and internal fixation of the right femur fracture.  Original Report Authenticated By: Fuller Canada, M.D.   Dg Femur Right  01/01/2011  *RADIOLOGY REPORT*   Clinical Data: Pain and swelling right thigh, deformity, patient states no known injury  RIGHT FEMUR - 2 VIEW  Comparison: None  Findings: Oblique distal right femoral diaphyseal fracture with apex posterior and lateral angulation. Lateral and posterior displacement noted as well. Hip and knee joint alignments appear grossly preserved. No additional fracture or dislocation identified. Visualized portion of right hemi pelvis appears intact. Osseous demineralization.  IMPRESSION: Displaced angulated distal right femoral diaphyseal fracture.  Patient transported to Emergency Department at conclusion of exam.  Original Report Authenticated By: Lollie Marrow, M.D.   Ct Head Wo Contrast  01/04/2011  *RADIOLOGY REPORT*  Clinical Data: Status post code.  Altered mental status.  CT HEAD WITHOUT CONTRAST  Technique:  Contiguous axial images were obtained from the base of the skull through the vertex without contrast.  Comparison: None.  Findings: There is atrophy and chronic small vessel disease changes. No acute intracranial abnormality.  Specifically, no hemorrhage, hydrocephalus, mass lesion, acute infarction, or significant intracranial injury.  No acute calvarial abnormality. Visualized paranasal sinuses and mastoids clear.  Orbital soft tissues unremarkable.  IMPRESSION: No acute intracranial abnormality.  Atrophy, chronic microvascular disease.  Original Report Authenticated By: Cyndie Chime, M.D.   US Venous Img Lower Bilateral  12/25/2010  *RADIOLOGY REPORT*  Clinical Data: Right leg pain and edema.  BILATERAL LOWER EXTREMITY VENOUS DOPPLER ULTRASOUND  Technique: Gray-scale sonography with compression, as well as color and duplex ultrasound, were performed to evaluate the deep venous system from the level of the common femoral vein through the popliteal and proximal calf veins.  Comparison: None  Findings: On the right, there is  occlusive noncompressible thrombus in the femoral vein extending through the  popliteal vein. Profunda femoris and common femoral veins remain patent. Noncompressible peroneal veins are noted.  On the left, normal compressibility of  the common femoral, superficial femoral, and popliteal veins, as well as the proximal calf veins.  No filling defects to suggest DVT on grayscale or color Doppler imaging.  Doppler waveforms show normal direction of venous flow, normal respiratory phasicity and response to augmentation.  IMPRESSION: 1.  Occlusive right femoropopliteal DVT. 2.  No evidence of left lower extremity deep vein thrombosis.  Original Report Authenticated By: Osa Craver, M.D.   US Venous Img Lower Unilateral Right  01/01/2011  *RADIOLOGY REPORT*  Clinical Data: Right leg pain and swelling.  History of femur fracture.  RIGHT LOWER EXTREMITY VENOUS DUPLEX ULTRASOUND  Technique:  Gray-scale sonography with graded compression, as well as color Doppler and duplex ultrasound, were performed to evaluate the deep venous system of the lower extremity from the level of the common femoral vein through the popliteal and proximal calf veins. Spectral Doppler was utilized to evaluate flow at rest and with  distal augmentation maneuvers.  Comparison:  12/25/2010.  Findings: The common femoral, profunda femoral and upper superficial femoral veins are patent and compressible.  There is clot in the mid superficial femoral vein with partial occlusion and complete occlusion in the distal SFA and popliteal veins. This appears stable.  IMPRESSION: Deep venous thrombosis in the superficial femoral and popliteal veins, unchanged since prior examination.  Original Report Authenticated By: P. Loralie Champagne, M.D.   Dg Chest Port 1 View  01/03/2011  *RADIOLOGY REPORT*  Clinical Data: Respiratory distress.  Central line placement  PORTABLE CHEST - 1 VIEW  Comparison: 01/03/2011 at 2058 hours  Findings: Endotracheal tube remains in place with tip about 3.2 cm above the carina.  Since the previous  study, there is been interval placement of a left central venous catheter with tip over the low SVC region.  An NG tube has been placed with tip not visualized but below the left hemidiaphragm, consistent with location at least in the upper stomach.  No pneumothorax.  No blunting of costophrenic angles.  Normal heart size and pulmonary vascularity. Emphysematous changes in the lungs.  Suggestion of central bronchiectasis and mild perihilar infiltration.  IMPRESSION: Appliances appear to be in satisfactory location.  No significant complication demonstrated.  Original Report Authenticated By: Marlon Pel, M.D.   Dg Chest Port 1 View  01/03/2011  *RADIOLOGY REPORT*  Clinical Data: Intubation.  PORTABLE CHEST - 1 VIEW 01/03/2011 2028 hours:  Comparison: Portable chest x-ray 01/01/2011 and two-view chest x- ray 12/28/2010 [facility].  Findings: Endotracheal tube tip in satisfactory position approximately 3 cm above the carina.  Artifact overlying the left side of the upper chest mimics a pneumothorax.  No evidence of pneumothorax or pneumomediastinum.  Patchy airspace opacities at the right lung base.  Lungs otherwise clear.  Cardiac silhouette normal in size.  IMPRESSION:  1.  Endotracheal tube tip in satisfactory position approximately 3 cm above the carina. 2.  Atelectasis versus bronchopneumonia at the right lung base.  Original Report Authenticated By: Arnell Sieving, M.D.   Dg Femur Right Port  01/03/2011  *RADIOLOGY REPORT*  Clinical Data: Distal femur fracture.  PORTABLE RIGHT FEMUR - 2 VIEW  Comparison: 01/01/2011  Findings: Intermedullary rod with proximal and distal fixation screws are seen transfixing a distal femoral shaft fracture in near anatomic alignment.  Associated soft tissue swelling of the thigh noted.  IMPRESSION: Intermedullary rod fixation of distal femoral shaft fracture in near anatomic alignment.  Original Report Authenticated By: Danae Orleans, M.D.       ASSESSMENT:  1.  S/P Cardiac arrest secondary to acute inferior MI 2.  Severe LV dysfunction EF 25-30% by echo 3.  afib with RVR, torsades and VT resolved s/p cardioversion on IV amiodarone gtt 4.  S/P acute inferior STEMI s/p PCI of RCA 5.  DVT on IV Heparin gtt  6.  Anemia 7.  Hypokalemia repleted PLAN:   1.  Continue to cycle enzymes until they peak 2.  Continue IV Amio gtt 3.  IV Heparin to restart at 2pm 4.  ASA/Plavix 5.  No beta blocker due to hypotension 6.  Continue IV dopamine for BP support and wean as BP tolerates  Quintella Reichert, MD  01/04/2011  12:33 PM

## 2011-01-04 NOTE — Op Note (Addendum)
PROCEDURE:  Left heart catheterization with selective coronary angiography, left ventriculogram. PCI of the RCA.  INDICATIONS:    The risks, benefits, and details of the procedure were explained to the patient's family.  They verbalized understanding and wanted to proceed.  Informed written consent was obtained.  ST elevation in the noted on her ECG sometime before.  Transfer to the catheterization laboratory was delayed by multiple cardiac arrests treated with CPR and defibrillation.  She was found to be severely anemic by CBC as well and required transfusion.  PROCEDURE TECHNIQUE:  After Xylocaine anesthesia a 37F sheath was placed in the right femoral vein and right femoral artery with  single anterior needle wall sticks.   Left coronary angiography was done using a Judkins L4 guide catheter.  Right coronary angiography was done using a Judkins R4 guide catheter.  The intervention was performed using an AL-1 guiding catheter.  Angiomax was used for anticoagulation.  Plavix was crushed and placed down her OG tube.   Left ventriculography was done using a pigtail catheter.    CONTRAST:  Total of 135 cc.  COMPLICATIONS:  None.    HEMODYNAMICS:  Aortic pressure was 84/58; LV pressure was 81/13  LVEDP 23.  There was no gradient between the left ventricle and aorta.    ANGIOGRAPHIC DATA:   The left main coronary artery is widely patent.  The left anterior descending artery is a medium-sized vessel which wraps around the apex.  The distal vessel appears small, likely due to vasospasm.  The left circumflex artery is large vessel.  There is a large branching OM 1.  There is a 25% stenosis in the vessel..  The right coronary artery is a large dominant vessel.  There is a thrombotic 90% mid vessel occlusion.  There is significant calcification in the mid vessel.  There is an area of apparent embolized thrombus in the mid posterior lateral artery.  The distal posterior descending artery also appears to be  occluded from an embolus, likely from the mid right coronary artery.  PCI NARRATIVE:  The care for and no torque right catheter were unable to engage the right coronary artery.  We used an AL-1.  A pro-water wire was placed into the right coronary artery system.  A BMW wire was then placed across the lesion into the posterior lateral artery.  2 wires were used for additional support.  A 2.25 x 20 emergent balloon was used to predilate the lesion to 8 atmospheres.  A 2.5 x 28 mini vision stent was then placed across the diseased area in the mid right coronary artery and deployed at 16 atmospheres.  The stent was postdilated with a 3.0 x 20 and declining Inflated to 18 atmospheres.  There is no residual stenosis.  The wire was then redirected into the posterior descending artery at the area of occlusion in the distal vessel.  There was no significant improvement in flow.  LEFT VENTRICULOGRAM:  Left ventricular angiogram was done in the 30 RAO projection and revealed severe left ventricular systolic dysfunction.  There is inferior and apical hypokinesis of severe degree.  The distal anterior wall also appears hypokinetic.  Only the mid to basal anterior wall appears to be squeezing well.  The estimated ejection fraction is 25%.  IMPRESSIONS:  1. Normal left main coronary artery. 2.  Mild atherosclerosis in the left anterior descending and circumflex. 3.  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    4. severe left ventricular dysfunction with inferior apical and distal anterior   hypokinesis of severe degree.  The estimated ejection fraction is 25%.    RECOMMENDATION:   patient will be given her Plavix through the OG tube.  Continue aspirin as well.  Continue Angiomax for 2 hours after the Plavix was given.  The sheath will be removed 2 hours after the Angiomax is off.  Heparin can be  restarted 6-8 hours after sheath removal.  Beta blocker be started when the patient seemed medically stable.  She'll need aggressive secondary prevention.

## 2011-01-04 NOTE — Progress Notes (Signed)
eLink Physician-Brief Progress Note Patient Name: Timber Marshman DOB: 1945-12-31 MRN: [id number]  Date of Service  01/04/2011   HPI/Events of Note   S/p code - to cath lab for placement of bare metal stent RCA - on no sedation and not waking up  eICU Interventions  Head CT to r/o neurologic event such as head bleed.   Intervention Category Major Interventions: Change in mental status - evaluation and management  Jorell Agne 01/04/2011, 2:44 AM

## 2011-01-04 NOTE — Progress Notes (Signed)
ANTICOAGULATION CONSULT NOTE - Follow Up Consult  Pharmacy Consult for heparin Indication: DVT and STEMI  No Known Allergies  Patient Measurements: Height: 5' (152.4 cm) Weight: 110 lb (49.896 kg) IBW/kg (Calculated) : 45.5   Vital Signs: Temp: 98 F (36.7 C) (12/08 0200) BP: 96/70 mmHg (12/08 0200) Pulse Rate: 100  (12/08 0200)  Labs:  Basename 01/03/11 2157 01/03/11 2009 01/03/11 1038 01/03/11 0550 01/03/11 0012 01/02/11 1541  HGB 8.2* 5.9* -- -- -- --  HCT 25.5* 18.2* 27.0* -- -- --  PLT 432* 305 -- 450* -- --  APTT 32 37 -- -- -- --  LABPROT 16.2* -- -- 15.3* -- 18.9*  INR 1.27 -- -- 1.18 -- 1.55*  HEPARINUNFRC -- -- -- -- 0.25* --  CREATININE 0.65 0.46* -- 0.47* -- --  CKTOTAL -- -- -- -- -- --  CKMB -- -- -- -- -- --  TROPONINI -- -- -- -- -- --   Estimated Creatinine Clearance: 49.7 ml/min (by C-G formula based on Cr of 0.65).   Medications:  Scheduled:    . amiodarone  300 mg Intravenous Once  . aspirin EC  325 mg Oral Daily  . aspirin  300 mg Rectal Once  . bivalirudin      . calcium gluconate  1 g Intravenous Once  . ceFAZolin (ANCEF) IV  1 g Intravenous Once  . ceFAZolin (ANCEF) IV  1 g Intravenous Q6H  . clopidogrel      . clopidogrel  600 mg Oral Once  . clopidogrel  75 mg Oral Q breakfast  . cyanocobalamin  1,000 mcg Intramuscular Weekly  . heparin      . magnesium sulfate      . pantoprazole (PROTONIX) IV  40 mg Intravenous QHS  . potassium chloride      . DISCONTD: amiodarone  300 mg Intravenous Once  . DISCONTD: atenolol  50 mg Oral BID  . DISCONTD: calcium-vitamin D  1 tablet Oral BID  . DISCONTD: docusate sodium  100 mg Oral BID  . DISCONTD: HYDROmorphone      . DISCONTD: magnesium sulfate  1 g Intravenous Once  . DISCONTD: polysaccharide iron  150 mg Oral Daily  . DISCONTD: traZODone  50 mg Oral QHS  . DISCONTD: warfarin  7.5 mg Oral ONCE-1800   Infusions:    . sodium chloride    . amiodarone (CORDARONE) infusion    . amiodarone  (CORDARONE) infusion 60 mg/hr (01/03/11 2200)   Followed by  . amiodarone (CORDARONE) infusion    . bivalirudin (ANGIOMAX) infusion 5 mg/mL (Cath Lab,ACS,PCI indication)    . DOPamine    . heparin 900 Units/hr (01/03/11 0259)  . DISCONTD: sodium chloride 100 mL/hr at 01/03/11 1805  . DISCONTD: sodium chloride 125 mL/hr at 01/03/11 1945  . DISCONTD: heparin 900 Units/hr (01/03/11 1810)    Assessment: 65yo female on heparin for subtherapeutic INR w/ DVT <2wk ago, called as code STEMI, cath postponed due to code blue requiring CPR and defibrillation, now s/p cath where 95% thrombotic lesion found in mid-RCA, bare stent placed, to resume heparin 8hr after sheath pulled; spoke with RN, sheath should be pulled at ~0600.  Goal of Therapy:  Heparin level 0.3-0.7 units/ml   Plan:  Will resume heparin at 1400 today at previous rate of 900 units/hr and monitor heparin levels and CBC.  Colleen Can PharmD BCPS 01/04/2011,2:30 AM

## 2011-01-04 NOTE — Progress Notes (Signed)
ANTICOAGULATION CONSULT NOTE - Follow Up Consult  Pharmacy Consult for heparin Indication: DVT and STEMI  No Known Allergies  Patient Measurements: Height: 5' (152.4 cm) Weight: 112 lb 7 oz (51 kg) IBW/kg (Calculated) : 45.5    Labs:  Basename 01/04/11 2001 01/04/11 1300 01/04/11 0500 01/03/11 2157 01/03/11 2009 01/03/11 0550 01/03/11 0012  HGB -- -- 12.4 8.2* -- -- --  HCT -- -- 35.1* 25.5* 18.2* -- --  PLT -- -- 386 432* 305 -- --  APTT -- -- 70* 32 37 -- --  LABPROT -- -- 19.5* 16.2* -- 15.3* --  INR -- -- 1.62* 1.27 -- 1.18 --  HEPARINUNFRC 0.27* -- -- -- -- -- 0.25*  CREATININE -- -- 0.57 0.65 0.46* -- --  CKTOTAL -- 1572* 1735* -- -- -- --  CKMB -- 154.8* 184.5* -- -- -- --  TROPONINI -- >25.00* >25.00* -- -- -- --   Estimated Creatinine Clearance: 49.7 ml/min (by C-G formula based on Cr of 0.57).   Infusions:     . sodium chloride 20 mL/hr at 01/04/11 2000  . amiodarone (CORDARONE) infusion 60 mg/hr (01/04/11 0823)  . amiodarone (CORDARONE) infusion 59.94 mg/hr (01/04/11 0300)   Followed by  . amiodarone (CORDARONE) infusion 30 mg/hr (01/04/11 2000)  . bivalirudin (ANGIOMAX) infusion 5 mg/mL (Cath Lab,ACS,PCI indication)    . DOPamine 10 mcg/kg/min (01/04/11 2006)  . heparin 900 Units/hr (01/04/11 1500)  . DISCONTD: sodium chloride 100 mL/hr at 01/03/11 1805  . DISCONTD: sodium chloride 125 mL/hr at 01/03/11 1945  . DISCONTD: heparin 900 Units/hr (01/03/11 1810)    Assessment: 65yo female on heparin for subtherapeutic INR w/ DVT <2wk ago, called as code STEMI, cath postponed due to code blue requiring CPR and defibrillation, now s/p cath, heparin resumed 8h after sheath pulled.  Now therapeutic.  Goal of Therapy:  Heparin level 0.3-0.7 units/ml   Plan:  Continue heparin at 900 units/hr Monitor daily level and CBC  Julitza Rickles L. Illene Bolus, PharmD, BCPS Clinical Pharmacist Pager: [phone] 01/04/2011 9:09 PM

## 2011-01-04 NOTE — Consult Note (Signed)
Contacted re code at 10pm.  Spoke with CCM Attending, Card Fellow, nursing staff on 2300, and family.  Reviewed day's labs and recent events, which included four shocks for asystole and arrhythmia per nursing. Did see patient, intubated, in route to and during catheterization, but unable to examine.  Patient was receiving blood at time of transport and 2nd unit during cath.  Blood appears to have been crossmatched with a third sample used for reference today. Cath appeared to show significant RCA disease.   Any thrombolytic agent deemed necessary or advisable for cardiac function may be dealt with from an orthopaedic standpoint should complications occur.  The apposition of the bone fragments should help to mitigate some of this potential.  The orthopaedic service greatly appreciates the continued help and expertise of CCM, Cardiology, and SICU staff in caring for [name].

## 2011-01-04 NOTE — Progress Notes (Signed)
CRITICAL VALUE ALERT  Critical value received:  ckmb 184.5  Date of notification:  01/04/11  Time of notification: 0642  Critical value read back:yes  Nurse who received alert: Chip Harris  MD notified (1st page):    Time of first page:   MD notified (2nd page):  Time of second page:  Responding MD:    Time MD responded:

## 2011-01-05 ENCOUNTER — Inpatient Hospital Stay (HOSPITAL_COMMUNITY): Payer: Medicare Other

## 2011-01-05 LAB — BASIC METABOLIC PANEL
BUN: 12 mg/dL (ref 6–23)
Creatinine, Ser: 0.53 mg/dL (ref 0.50–1.10)
GFR calc Af Amer: >90 mL/min (ref >90)
GFR calc non Af Amer: >90 mL/min (ref >90)
Glucose, Bld: 135 mg/dL — ABNORMAL HIGH (ref 70–99)

## 2011-01-05 LAB — CBC
HCT: 32.3 % — ABNORMAL LOW (ref 36.0–46.0)
Hemoglobin: 11 g/dL — ABNORMAL LOW (ref 12.0–15.0)
MCHC: 34.1 g/dL (ref 30.0–36.0)
MCV: 83.2 fL (ref 78.0–100.0)
RDW: 17.3 % — ABNORMAL HIGH (ref 11.5–15.5)

## 2011-01-05 LAB — TYPE AND SCREEN
Antibody Screen: NEGATIVE
Unit division: 0

## 2011-01-05 MED ORDER — SODIUM CHLORIDE 0.9 % IV SOLN
2.0000 mg/h | INTRAVENOUS | Status: DC
  Administered 2011-01-05 – 2011-01-06 (×3): 2 mg/h via INTRAVENOUS
  Filled 2011-01-05 (×2): qty 10

## 2011-01-05 MED ORDER — MIDAZOLAM BOLUS VIA INFUSION
1.0000 mg | INTRAVENOUS | Status: DC | PRN
  Filled 2011-01-05: qty 2

## 2011-01-05 MED ORDER — SODIUM CHLORIDE 0.9 % IV SOLN
50.0000 ug/h | INTRAVENOUS | Status: DC
  Administered 2011-01-05 (×2): 50 ug/h via INTRAVENOUS
  Filled 2011-01-05: qty 50

## 2011-01-05 MED ORDER — CHLORHEXIDINE GLUCONATE 0.12 % MT SOLN
15.0000 mL | Freq: Two times a day (BID) | OROMUCOSAL | Status: DC
  Administered 2011-01-05 – 2011-01-14 (×19): 15 mL via OROMUCOSAL
  Filled 2011-01-05 (×18): qty 15

## 2011-01-05 MED ORDER — POTASSIUM CHLORIDE 10 MEQ/100ML IV SOLN
10.0000 meq | INTRAVENOUS | Status: AC
  Administered 2011-01-05 (×4): 10 meq via INTRAVENOUS
  Filled 2011-01-05 (×4): qty 100

## 2011-01-05 MED ORDER — FENTANYL BOLUS VIA INFUSION
50.0000 ug | Freq: Four times a day (QID) | INTRAVENOUS | Status: DC | PRN
  Filled 2011-01-05: qty 100

## 2011-01-05 MED ORDER — ACETYLCYSTEINE 20 % IN SOLN
400.0000 mg | Freq: Four times a day (QID) | RESPIRATORY_TRACT | Status: DC
  Administered 2011-01-05 – 2011-01-06 (×2): 400 mg via RESPIRATORY_TRACT
  Filled 2011-01-05 (×8): qty 4

## 2011-01-05 MED ORDER — ASPIRIN 325 MG PO TABS
325.0000 mg | ORAL_TABLET | Freq: Every day | ORAL | Status: DC
  Administered 2011-01-05 – 2011-01-12 (×8): 325 mg via ORAL
  Filled 2011-01-05 (×9): qty 1

## 2011-01-05 MED ORDER — ACETYLCYSTEINE 10% NICU INHALATION SOLUTION: 2.0000 mL | Freq: Four times a day (QID) | RESPIRATORY_TRACT | Status: DC

## 2011-01-05 MED ORDER — DEXTROSE 5 % IV SOLN
150.0000 mg | Freq: Once | INTRAVENOUS | Status: AC
  Administered 2011-01-05: 150 mg via INTRAVENOUS

## 2011-01-05 MED ORDER — PHENYLEPHRINE HCL 10 MG/ML IJ SOLN
30.0000 ug/min | INTRAVENOUS | Status: DC
  Administered 2011-01-05: 85 ug/min via INTRAVENOUS
  Administered 2011-01-05: 5 ug/min via INTRAVENOUS
  Filled 2011-01-05 (×2): qty 2

## 2011-01-05 MED ORDER — ALBUTEROL SULFATE (5 MG/ML) 0.5% IN NEBU
2.5000 mg | INHALATION_SOLUTION | Freq: Four times a day (QID) | RESPIRATORY_TRACT | Status: DC
  Administered 2011-01-05 – 2011-01-07 (×7): 2.5 mg via RESPIRATORY_TRACT
  Filled 2011-01-05 (×6): qty 0.5

## 2011-01-05 MED ORDER — PHENYLEPHRINE HCL 10 MG/ML IJ SOLN
30.0000 ug/min | INTRAVENOUS | Status: DC
  Administered 2011-01-05: 85 ug/min via INTRAVENOUS
  Administered 2011-01-07: 15 ug/min via INTRAVENOUS
  Administered 2011-01-08: 20 ug/min via INTRAVENOUS
  Administered 2011-01-08: 85 ug/min via INTRAVENOUS
  Administered 2011-01-09: 65 ug/min via INTRAVENOUS
  Administered 2011-01-10: 40 ug/min via INTRAVENOUS
  Filled 2011-01-05 (×7): qty 4

## 2011-01-05 MED ORDER — JEVITY 1.2 CAL PO LIQD
1000.0000 mL | ORAL | Status: DC
  Administered 2011-01-05: 1000 mL
  Filled 2011-01-05 (×3): qty 1000

## 2011-01-05 MED ORDER — BIOTENE DRY MOUTH MT LIQD
15.0000 mL | Freq: Two times a day (BID) | OROMUCOSAL | Status: DC
  Administered 2011-01-05 – 2011-01-15 (×20): 15 mL via OROMUCOSAL

## 2011-01-05 MED ORDER — ALBUTEROL SULFATE (5 MG/ML) 0.5% IN NEBU
INHALATION_SOLUTION | RESPIRATORY_TRACT | Status: AC
  Administered 2011-01-06: 2.5 mg via RESPIRATORY_TRACT
  Filled 2011-01-05: qty 0.5

## 2011-01-05 MED ORDER — ASPIRIN 81 MG PO CHEW
CHEWABLE_TABLET | ORAL | Status: AC
  Filled 2011-01-05: qty 4

## 2011-01-05 MED ORDER — ACETYLCYSTEINE 20 % IN SOLN
400.0000 mg | Freq: Four times a day (QID) | RESPIRATORY_TRACT | Status: DC
  Filled 2011-01-05: qty 16

## 2011-01-05 NOTE — Progress Notes (Signed)
eLink Physician-Brief Progress Note Patient Name: [name] DOB: 06/17/1945 MRN: [id number]  Date of Service  01/05/2011   HPI/Events of Note   Call from bedside nurse reporting left hand swelling.  This has worsened over the past hours on the shift.  The dorsum of the hand had a HL present which was nonfunctional and d/ced.  The hand is cool to touch but nail beds do not look cyanotic.  The nurse reports a fainter radial pulse on that side relative to the right.  There is a palpable brachial pulse on the left.  eICU Interventions  Suspect IV infiltration as well as some potential bleeding at site given anticoagulation s/p cardiac catherization.  Will monitor for now along with elevation of hand.   Intervention Category Intermediate Interventions:  (left hand swelling)  Caedyn Tassinari 01/05/2011, 2:31 AM

## 2011-01-05 NOTE — Progress Notes (Signed)
SUBJECTIVE:  Intubated  OBJECTIVE:   Vitals:   Filed Vitals:   01/05/11 0500 01/05/11 0600 01/05/11 0700 01/05/11 0718  BP: 94/55 105/74 103/60   Pulse: 84 91 141   Temp:    99 F (37.2 C)  TempSrc:    Oral  Resp: 15 15 15    Height:      Weight:      SpO2: 99% 100% 100%    I&O's:   Intake/Output Summary (Last 24 hours) at 01/05/11 0735 Last data filed at 01/05/11 0700  Gross per 24 hour  Intake 1552.46 ml  Output    660 ml  Net 892.46 ml   TELEMETRY: Reviewed telemetry pt in :    PHYSICAL EXAM General: Well developed, well nourished, in no acute distress Head: Eyes PERRLA, No xanthomas.   Normal cephalic and atramatic  Lungs:   Clear bilaterally to auscultation and percussion. Heart:   HRRR S1 S2 Pulses are 2+ & equal.            No carotid bruit. No JVD.  No abdominal bruits. No femoral bruits. Abdomen: Bowel sounds are positive, abdomen soft and non-tender without masses Extremities:   No clubbing, cyanosis or edema.  DP +1    LABS: Basic Metabolic Panel:  Basename 01/05/11 0500 01/04/11 0500 01/03/11 2157  NA 141 136 --  K 3.2* 3.5 --  CL 106 102 --  CO2 25 25 --  GLUCOSE 135* 185* --  BUN 12 10 --  CREATININE 0.53 0.57 --  CALCIUM 8.7 9.5 --  MG -- 2.5 4.4*  PHOS -- 4.3 4.0   Liver Function Tests:  Basename 01/04/11 0500  AST 251*  ALT 65*  ALKPHOS 150*  BILITOT 1.0  PROT 5.7*  ALBUMIN 2.0*   No results found for this basename: LIPASE:2,AMYLASE:2 in the last 72 hours CBC:  Basename 01/05/11 0500 01/04/11 0500  WBC 16.3* 14.8*  NEUTROABS -- --  HGB 11.0* 12.4  HCT 32.3* 35.1*  MCV 83.2 82.0  PLT 372 386   Cardiac Enzymes:  Basename 01/04/11 1300 01/04/11 0500  CKTOTAL 1572* 1735*  CKMB 154.8* 184.5*  CKMBINDEX -- --  TROPONINI >25.00* >25.00*   Thyroid Function Tests:  Basename 01/04/11 0500  TSH 0.364  T4TOTAL --  T3FREE --  THYROIDAB --   Anemia Panel: No results found for this basename:  VITAMINB12,FOLATE,FERRITIN,TIBC,IRON,RETICCTPCT in the last 72 hours Coag Panel:   Lab Results  Component Value Date   INR 1.65* 01/05/2011   INR 1.62* 01/04/2011   INR 1.27 01/03/2011    RADIOLOGY: Dg Chest 1 View  01/01/2011  *RADIOLOGY REPORT*  Clinical Data: Preoperative chest radiograph.  CHEST - 1 VIEW  Comparison: Chest radiograph performed 04/28/2010  Findings: The lungs are well-aerated and clear.  There is no evidence of focal opacification, pleural effusion or pneumothorax. Previously noted right hilar prominence has decreased in size, reflecting apparently normal vasculature.  The cardiomediastinal silhouette is within normal limits.  No acute osseous abnormalities are seen.  IMPRESSION: No acute cardiopulmonary process seen.  Original Report Authenticated By: Tonia Ghent, M.D.   Dg Chest 2 View  12/28/2010  *RADIOLOGY REPORT*  Clinical Data: Fevers, rule out pneumonia.  CHEST - 2 VIEW  Comparison: None  Findings: Two views of the chest were obtained.  The lungs are clear without airspace disease.  Prominent right hilar structures could be related to vessels but difficult to exclude lymphadenopathy, particularly on the lateral view. There is nodularity in the right hilar  region on the lateral view.  Bony structures are intact.  IMPRESSION: Prominent right hilar structures as described.  An old comparison exam would be useful if available.  Otherwise, this area could be evaluated with a chest CT with IV contrast.  No focal airspace disease.  Original Report Authenticated By: Richarda Overlie, M.D.   Dg Femur Right  01/03/2011  *RADIOLOGY REPORT*  Clinical Data: Femur fracture.  RIGHT FEMUR - 2 VIEW  Comparison: 01/01/2011.  Findings: Six intraoperative C-arm views submitted for review after surgery.  This reveals placement of right femoral rod with proximal and distal fixation screws transfixing spiral fracture of the right femur with much better alignment.  Small fracture fragment separate from  the major fracture fragment.  IMPRESSION: Open reduction and internal fixation of the right femur fracture.  Original Report Authenticated By: Fuller Canada, M.D.   Dg Femur Right  01/01/2011  *RADIOLOGY REPORT*  Clinical Data: Pain and swelling right thigh, deformity, patient states no known injury  RIGHT FEMUR - 2 VIEW  Comparison: None  Findings: Oblique distal right femoral diaphyseal fracture with apex posterior and lateral angulation. Lateral and posterior displacement noted as well. Hip and knee joint alignments appear grossly preserved. No additional fracture or dislocation identified. Visualized portion of right hemi pelvis appears intact. Osseous demineralization.  IMPRESSION: Displaced angulated distal right femoral diaphyseal fracture.  Patient transported to Emergency Department at conclusion of exam.  Original Report Authenticated By: Lollie Marrow, M.D.   Ct Head Wo Contrast  01/04/2011  *RADIOLOGY REPORT*  Clinical Data: Status post code.  Altered mental status.  CT HEAD WITHOUT CONTRAST  Technique:  Contiguous axial images were obtained from the base of the skull through the vertex without contrast.  Comparison: None.  Findings: There is atrophy and chronic small vessel disease changes. No acute intracranial abnormality.  Specifically, no hemorrhage, hydrocephalus, mass lesion, acute infarction, or significant intracranial injury.  No acute calvarial abnormality. Visualized paranasal sinuses and mastoids clear.  Orbital soft tissues unremarkable.  IMPRESSION: No acute intracranial abnormality.  Atrophy, chronic microvascular disease.  Original Report Authenticated By: Cyndie Chime, M.D.   US Venous Img Lower Bilateral  12/25/2010  *RADIOLOGY REPORT*  Clinical Data: Right leg pain and edema.  BILATERAL LOWER EXTREMITY VENOUS DOPPLER ULTRASOUND  Technique: Gray-scale sonography with compression, as well as color and duplex ultrasound, were performed to evaluate the deep venous system  from the level of the common femoral vein through the popliteal and proximal calf veins.  Comparison: None  Findings: On the right, there is  occlusive noncompressible thrombus in the femoral vein extending through the popliteal vein. Profunda femoris and common femoral veins remain patent. Noncompressible peroneal veins are noted.  On the left, normal compressibility of  the common femoral, superficial femoral, and popliteal veins, as well as the proximal calf veins.  No filling defects to suggest DVT on grayscale or color Doppler imaging.  Doppler waveforms show normal direction of venous flow, normal respiratory phasicity and response to augmentation.  IMPRESSION: 1.  Occlusive right femoropopliteal DVT. 2.  No evidence of left lower extremity deep vein thrombosis.  Original Report Authenticated By: Osa Craver, M.D.   US Venous Img Lower Unilateral Right  01/01/2011  *RADIOLOGY REPORT*  Clinical Data: Right leg pain and swelling.  History of femur fracture.  RIGHT LOWER EXTREMITY VENOUS DUPLEX ULTRASOUND  Technique:  Gray-scale sonography with graded compression, as well as color Doppler and duplex ultrasound, were performed to evaluate the  deep venous system of the lower extremity from the level of the common femoral vein through the popliteal and proximal calf veins. Spectral Doppler was utilized to evaluate flow at rest and with distal augmentation maneuvers.  Comparison:  12/25/2010.  Findings: The common femoral, profunda femoral and upper superficial femoral veins are patent and compressible.  There is clot in the mid superficial femoral vein with partial occlusion and complete occlusion in the distal SFA and popliteal veins. This appears stable.  IMPRESSION: Deep venous thrombosis in the superficial femoral and popliteal veins, unchanged since prior examination.  Original Report Authenticated By: P. Loralie Champagne, M.D.   Dg Chest Port 1 View  01/03/2011  *RADIOLOGY REPORT*  Clinical Data:  Respiratory distress.  Central line placement  PORTABLE CHEST - 1 VIEW  Comparison: 01/03/2011 at 2058 hours  Findings: Endotracheal tube remains in place with tip about 3.2 cm above the carina.  Since the previous study, there is been interval placement of a left central venous catheter with tip over the low SVC region.  An NG tube has been placed with tip not visualized but below the left hemidiaphragm, consistent with location at least in the upper stomach.  No pneumothorax.  No blunting of costophrenic angles.  Normal heart size and pulmonary vascularity. Emphysematous changes in the lungs.  Suggestion of central bronchiectasis and mild perihilar infiltration.  IMPRESSION: Appliances appear to be in satisfactory location.  No significant complication demonstrated.  Original Report Authenticated By: Marlon Pel, M.D.   Dg Chest Port 1 View  01/03/2011  *RADIOLOGY REPORT*  Clinical Data: Intubation.  PORTABLE CHEST - 1 VIEW 01/03/2011 2028 hours:  Comparison: Portable chest x-ray 01/01/2011 and two-view chest x- ray 12/28/2010 [facility].  Findings: Endotracheal tube tip in satisfactory position approximately 3 cm above the carina.  Artifact overlying the left side of the upper chest mimics a pneumothorax.  No evidence of pneumothorax or pneumomediastinum.  Patchy airspace opacities at the right lung base.  Lungs otherwise clear.  Cardiac silhouette normal in size.  IMPRESSION:  1.  Endotracheal tube tip in satisfactory position approximately 3 cm above the carina. 2.  Atelectasis versus bronchopneumonia at the right lung base.  Original Report Authenticated By: Arnell Sieving, M.D.   Dg Femur Right Port  01/03/2011  *RADIOLOGY REPORT*  Clinical Data: Distal femur fracture.  PORTABLE RIGHT FEMUR - 2 VIEW  Comparison: 01/01/2011  Findings: Intermedullary rod with proximal and distal fixation screws are seen transfixing a distal femoral shaft fracture in near anatomic alignment.  Associated  soft tissue swelling of the thigh noted.  IMPRESSION: Intermedullary rod fixation of distal femoral shaft fracture in near anatomic alignment.  Original Report Authenticated By: Danae Orleans, M.D.      ASSESSMENT:  1.  S/p cardiac arrest secondary to acute inferior MI 2.  Severe LV dysfunction EF 25-30% by echo 3.  Afib with RVR/torsades and VT s/p cardioversion currently in NSR with very frequent PAC's and nonsustained atrial tachycardia on IV amio gtt 4.  S/P acute inferior STEMI s/p PCI of RCA 5.  Anemia 6.  DVT on IV Heparin gtt 7.  Hypokalemia 8.  Leukocytosis 9.  Left hand and wrist swelling with very limb hand - possible fracture after her fall during cardiac arrest? 10.  Hypotension on IV dopamine gtt for pressor support  PLAN:   1.  Replete postassium 2.  BMET in am 3.  IV amio bolus 150mg  now to try to suppress PAC's and  atrial tach 4.  Xray of wrist at bedside to rule out fracture 5.  Try to wean dopamine 6.  Continue ASA/Plavix 7.  No beta blocker due to low BP  Quintella Reichert, MD  01/05/2011  7:35 AM

## 2011-01-05 NOTE — Progress Notes (Signed)
Name: [name] MRN: [id number] DOB: Jun 01, 1945    LOS: 4  PCCM Progress  NOTE  History of Present Illness:  Patient is a 65 y/o F with PMHx of HTN, DL who had a fall on 47/82/95, initially was taken to [facility] and was diagnosed with R femoral DVT and started on anticoagulation. Pt had persistent pain and she had further imaging studies which showed R femoral fracture. Pt was transferred to [facility] for further management and was taken to the OR on 01/03/11 and underwent Open reduction and internal fixation of the right femur fracture. Pt then had an episode of torsades on the floor and code blue was called. Pt was intubated , CPR was performed and received 1g of Mag. Pt was transferred to 2300 and developed an episode of A fib with RVR and the Vt tach. HR returned to 120's (A fib) and the became hypotensive. Pt was cardioverted and the started on Dopamine. Central line was placed on the L subclavian A and EKG showed ST elevation on the inferior leads.  Cardiology was consulted and patient will be taken to the cath lab.   Lines / Drains: L IJ CVL 12/7 ETT 12/7  Cultures: none  Antibiotics: none  Tests / Events: Slow to wake up this will limit vent wean  Past Medical History  Diagnosis Date  . Hypertension   . 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 Vitamin B12 deficiency (dietary) anemia 12/27/2010  . Arthritis    Past Surgical History  Procedure Date  . Abdominal hysterectomy     06/2010   Prior to Admission medications   Medication Sig Start Date End Date Taking? Authorizing Provider  acetaminophen (TYLENOL) 325 MG tablet Take 2 tablets (650 mg total) by mouth every 6 (six) hours as needed (or Fever >/= 101). 12/30/10 01/09/11 Yes Corinna L Sullivan  atenolol (TENORMIN) 50 MG tablet Take 50 mg by mouth 2 (two) times daily.     Yes Historical Provider, MD  Calcium Carbonate-Vitamin D ([employer] CALCIUM 600 + D) 600-400 MG-UNIT per tablet Take 1 tablet by mouth 2 (two) times  daily.     Yes Historical Provider, MD  cyanocobalamin (,VITAMIN B-12,) 1000 MCG/ML injection Inject 1 mL (1,000 mcg total) into the muscle once a week. 12/30/10 12/30/11 Yes Corinna L Sullivan  docusate sodium 100 MG CAPS Take 100 mg by mouth 2 (two) times daily. 12/30/10 01/09/11 Yes Corinna L Lendell Caprice  HYDROcodone-acetaminophen (NORCO) 5-325 MG per tablet Take 1 tablet by mouth every 6 (six) hours as needed for pain. 12/30/10 01/09/11 Yes Corinna L Sullivan  polysaccharide iron (NIFEREX) 150 MG CAPS capsule Take 1 capsule (150 mg total) by mouth daily. 12/30/10  Yes Corinna L Sullivan  traZODone (DESYREL) 50 MG tablet Take 50 mg by mouth at bedtime. For insomnia   Yes Historical Provider, MD  warfarin (COUMADIN) 5 MG tablet Take 1 tablet (5 mg total) by mouth at bedtime. 12/30/10 12/30/11 Yes Corinna L Lendell Caprice   Allergies No Known Allergies  Family History History reviewed. No pertinent family history.  Social History  reports that she has never smoked. She has never used smokeless tobacco. She reports that she does not drink alcohol or use illicit drugs.  Review Of Systems  11 points review of systems is negative with an exception of listed in HPI.  Vital Signs: Temp:  [97.6 F (36.4 C)-99.9 F (37.7 C)] 99 F (37.2 C) (12/09 0718) Pulse Rate:  [41-141] 141  (12/09 0700) Resp:  [  13-20] 15  (12/09 0700) BP: (81-128)/(53-90) 103/60 mmHg (12/09 0700) SpO2:  [97 %-100 %] 100 % (12/09 0700) FiO2 (%):  [30 %-40 %] 30 % (12/09 0700) Weight:  [49.6 kg (109 lb 5.6 oz)] 109 lb 5.6 oz (49.6 kg) (12/09 0300) I/O last 3 completed shifts: In: 2152.5 [I.V.:1672.5; NG/GT:130; IV Piggyback:350] Out: 2105 [Urine:1955; Emesis/NG output:150]  Physical Examination: General:  ETT in placed ,sedated on vent Neuro:  Moves all 4s will open eyes to voice   HEENT:  No jVD,  No TMG Neck:  supple   Cardiovascular:  Tachy, nl s1/s2 no s3/s4 Lungs:  clear Abdomen:  Soft NT BS decreased Musculoskeletal: L  wrist swollen ? fx vs infilt iv Skin:  clear  Ventilator settings: Vent Mode:  [-] PRVC FiO2 (%):  [30 %-40 %] 30 % Set Rate:  [15 bmp] 15 bmp Vt Set:  [400 mL] 400 mL PEEP:  [5 cmH20] 5 cmH20 Plateau Pressure:  [14 cmH20-16 cmH20] 14 cmH20  Labs and Imaging:  Reviewed.  Please refer to the Assessment and Plan section for relevant results.  Assessment and Plan: Patient is a 65 y/o F with PMHx of HTN, DL who was diagnosed with R femoral DVT, started on anticoagulation and R femoral fracture s/p Open reduction and internal fixation of the right femur fracture and on the post op period developed cardioresp arrest s/p CPR with cardiac arrhythmias (torsades, then afib with RVR and VT).  1. S/p cardirespiratory arrest, AMI d/t RCA closure s/p bare metal stent Acute respiratory failure -cont full vent support  -f/u echo -titrate vasopressors>>change to NEO -per cardiology, cont  hep drip  2. A fib with RVR  - sp cardioversion  - amiod drip  3. R low extrem DVT  -cont hep - follow CBC   4. Anemia   Lab 01/05/11 0500 01/04/11 0500 01/03/11 2157 01/03/11 2009 01/03/11 1038  HCT 32.3* 35.1* 25.5* 18.2* 27.0*   -trend Hgb, tfx for Hgb < or equal to 9  5. Hypokalemia   Lab 01/05/11 0500 01/04/11 0500 01/03/11 2157 01/03/11 2009 01/03/11 1038  K 3.2* 3.5 3.1* 2.4* >9.0*  replete  6. DVT and GI prophyl;axis  - Hep drip and Protonix Iv   7. FEN   Lab 01/05/11 0500 01/04/11 0500 01/03/11 2157 01/03/11 2009 01/03/11 1038  NA 141 136 136 142 134*    - track BMET  8. Swollen L wrist -f/u xray -d/c L wrist IV   Best practices / Disposition: -->ICU status under PCCM -->full code -->Heparin for DVT Px -->Protonix for GI Px -->ventilator bundle -->diet>>start TF  -The patient is critically ill with multiple organ systems failure and requires high complexity decision making for assessment and support, frequent evaluation and titration of therapies, application of advanced  monitoring technologies and extensive interpretation of multiple databases. Critical Care Time devoted to patient care services described in this note is 40 minutes.  Shan Levans  M.D. Pulmonary and Critical Care Medicine [facility] Cell: [phone]   Pager: [phone] 01/05/2011, 8:03 AM

## 2011-01-05 NOTE — Progress Notes (Signed)
Subjective: 1 Day Post-Op Procedure(s) (LRB): LEFT HEART CATHETERIZATION WITH CORONARY ANGIOGRAM (N/A) Patient reports pain as Pt intubated.    Objective: Vital signs in last 24 hours: Temp:  [97.6 F (36.4 C)-99.9 F (37.7 C)] 99 F (37.2 C) (12/09 0718) Pulse Rate:  [41-141] 116  (12/09 0900) Resp:  [13-19] 19  (12/09 0900) BP: (81-128)/(53-90) 114/88 mmHg (12/09 0900) SpO2:  [95 %-100 %] 99 % (12/09 0900) FiO2 (%):  [30 %-40 %] 30 % (12/09 0857) Weight:  [49.6 kg (109 lb 5.6 oz)] 109 lb 5.6 oz (49.6 kg) (12/09 0300)  Intake/Output from previous day: 12/08 0701 - 12/09 0700 In: 1552.5 [I.V.:1512.5; NG/GT:40] Out: 660 [Urine:660] Intake/Output this shift: Total I/O In: 209 [I.V.:9; IV Piggyback:200] Out: 165 [Urine:65; Emesis/NG output:100]   Basename 01/05/11 0500 01/04/11 0500 01/03/11 2157 01/03/11 2009 01/03/11 1038  HGB 11.0* 12.4 8.2* 5.9* 9.2*    Basename 01/05/11 0500 01/04/11 0500  WBC 16.3* 14.8*  RBC 3.88 4.28  HCT 32.3* 35.1*  PLT 372 386    Basename 01/05/11 0500 01/04/11 0500  NA 141 136  K 3.2* 3.5  CL 106 102  CO2 25 25  BUN 12 10  CREATININE 0.53 0.57  GLUCOSE 135* 185*  CALCIUM 8.7 9.5    Basename 01/05/11 0500 01/04/11 0500  LABPT -- --  INR 1.65* 1.62*    Intact pulses distally No cellulitis present Compartment soft  Assessment/Plan: 1 Day Post-Op Procedure(s) (LRB): LEFT HEART CATHETERIZATION WITH CORONARY ANGIOGRAM (N/A) Pt under care of critical care team.  Doctor Carola Frost will see her tomorrow to evaluate further from an orthopedic standpoint.  Genifer Lazenby,STEPHEN D 01/05/2011, 9:54 AM

## 2011-01-05 NOTE — Progress Notes (Signed)
eLink Physician-Brief Progress Note Patient Name: [name] DOB: 1945/03/30 MRN: [id number]  Date of Service  01/05/2011   HPI/Events of Note  hypokalemia   eICU Interventions  Potassium replaced   Intervention Category Intermediate Interventions: Electrolyte abnormality - evaluation and management  DETERDING,ELIZABETH 01/05/2011, 7:11 AM

## 2011-01-05 NOTE — Progress Notes (Signed)
ANTICOAGULATION CONSULT NOTE - Follow Up Consult  Pharmacy Consult for heparin Indication: recent DVT and STEMI  No Known Allergies  Patient Measurements: Height: 5' (152.4 cm) Weight: 109 lb 5.6 oz (49.6 kg) IBW/kg (Calculated) : 45.5    Labs:  Basename 01/05/11 0500 01/04/11 2001 01/04/11 1300 01/04/11 0500 01/03/11 2157 01/03/11 2009 01/03/11 0012  HGB 11.0* -- -- 12.4 -- -- --  HCT 32.3* -- -- 35.1* 25.5* -- --  PLT 372 -- -- 386 432* -- --  APTT -- -- -- 70* 32 37 --  LABPROT 19.8* -- -- 19.5* 16.2* -- --  INR 1.65* -- -- 1.62* 1.27 -- --  HEPARINUNFRC 0.51 0.27* -- -- -- -- 0.25*  CREATININE 0.53 -- -- 0.57 0.65 -- --  CKTOTAL -- -- 1572* 1735* -- -- --  CKMB -- -- 154.8* 184.5* -- -- --  TROPONINI -- -- >25.00* >25.00* -- -- --   Estimated Creatinine Clearance: 49.7 ml/min (by C-G formula based on Cr of 0.53).   Infusions:     . sodium chloride 20 mL/hr at 01/04/11 2000  . amiodarone (CORDARONE) infusion 60 mg/hr (01/04/11 0823)  . amiodarone (CORDARONE) infusion 30.06 mg/hr (01/05/11 1400)  . feeding supplement (JEVITY 1.2) 1,000 mL (01/05/11 0953)  . fentaNYL infusion INTRAVENOUS 50 mcg/hr (01/05/11 1400)  . heparin 900 Units/hr (01/05/11 1159)  . midazolam (VERSED) infusion 2 mg/hr (01/05/11 1400)  . phenylephrine (NEO-SYNEPHRINE) Adult infusion 45 mcg/min (01/05/11 1400)  . DISCONTD: DOPamine 2 mcg/kg/min (01/05/11 1411)    Assessment: 65 yo female on heparin for subtherapeutic INR w/ DVT <2wk ago and recent STEMI. Heparin level is therapeutic. No bleeding noted.  Goal of Therapy:  Heparin level 0.3-0.7 units/ml   Plan:  1. Continue heparin at 900 units/hr (9 ml/hr) 2. Monitor daily heparin level and CBC  Lovell Sheehan 01/05/2011 2:38 PM

## 2011-01-06 ENCOUNTER — Inpatient Hospital Stay (HOSPITAL_COMMUNITY): Payer: Medicare Other

## 2011-01-06 ENCOUNTER — Ambulatory Visit (HOSPITAL_COMMUNITY): Admit: 2011-01-06 | Payer: Self-pay | Admitting: Interventional Cardiology

## 2011-01-06 DIAGNOSIS — R579 Shock, unspecified: Secondary | ICD-10-CM

## 2011-01-06 DIAGNOSIS — R570 Cardiogenic shock: Secondary | ICD-10-CM | POA: Diagnosis present

## 2011-01-06 DIAGNOSIS — J9600 Acute respiratory failure, unspecified whether with hypoxia or hypercapnia: Secondary | ICD-10-CM

## 2011-01-06 DIAGNOSIS — Z9911 Dependence on respirator [ventilator] status: Secondary | ICD-10-CM

## 2011-01-06 LAB — BASIC METABOLIC PANEL
Calcium: 8.6 mg/dL (ref 8.4–10.5)
GFR calc Af Amer: >90 mL/min (ref >90)
GFR calc non Af Amer: 90 mL/min — ABNORMAL LOW (ref >90)
Potassium: 3.5 mEq/L (ref 3.5–5.1)
Sodium: 139 mEq/L (ref 135–145)

## 2011-01-06 LAB — GLUCOSE, CAPILLARY
Glucose-Capillary: 202 mg/dL — ABNORMAL HIGH (ref 70–99)
Glucose-Capillary: 179 mg/dL — ABNORMAL HIGH (ref 70–99)

## 2011-01-06 LAB — URINALYSIS, ROUTINE W REFLEX MICROSCOPIC
Ketones, ur: NEGATIVE mg/dL
Nitrite: NEGATIVE
Specific Gravity, Urine: 1.023 (ref 1.005–1.030)
pH: 5.5 (ref 5.0–8.0)

## 2011-01-06 LAB — PROTIME-INR
INR: 1.64 — ABNORMAL HIGH (ref 0.00–1.49)
Prothrombin Time: 19.7 seconds — ABNORMAL HIGH (ref 11.6–15.2)

## 2011-01-06 LAB — CBC
MCH: 27.7 pg (ref 26.0–34.0)
Platelets: 500 10*3/uL — ABNORMAL HIGH (ref 150–400)
RBC: 3.68 MIL/uL — ABNORMAL LOW (ref 3.87–5.11)
WBC: 14.5 10*3/uL — ABNORMAL HIGH (ref 4.0–10.5)

## 2011-01-06 LAB — URINE MICROSCOPIC-ADD ON

## 2011-01-06 LAB — POCT ACTIVATED CLOTTING TIME: Activated Clotting Time: 358 seconds

## 2011-01-06 LAB — HEPARIN LEVEL (UNFRACTIONATED): Heparin Unfractionated: 0.44 IU/mL (ref 0.30–0.70)

## 2011-01-06 MED ORDER — METHYLPREDNISOLONE SODIUM SUCC 125 MG IJ SOLR
80.0000 mg | Freq: Once | INTRAMUSCULAR | Status: AC
  Administered 2011-01-06: 80 mg via INTRAVENOUS

## 2011-01-06 MED ORDER — RACEPINEPHRINE HCL 2.25 % IN NEBU
INHALATION_SOLUTION | RESPIRATORY_TRACT | Status: AC
  Filled 2011-01-06: qty 0.5

## 2011-01-06 MED ORDER — PHENOL 1.4 % MT LIQD: 1.0000 | OROMUCOSAL | Status: DC | PRN

## 2011-01-06 MED ORDER — DEXTROSE 5 % IV SOLN
1.0000 g | Freq: Three times a day (TID) | INTRAVENOUS | Status: DC
  Administered 2011-01-06 – 2011-01-14 (×24): 1 g via INTRAVENOUS
  Filled 2011-01-06 (×27): qty 1

## 2011-01-06 MED ORDER — METHYLPREDNISOLONE SODIUM SUCC 125 MG IJ SOLR
INTRAMUSCULAR | Status: AC
  Filled 2011-01-06: qty 2

## 2011-01-06 MED ORDER — BUDESONIDE 0.5 MG/2ML IN SUSP
0.5000 mg | Freq: Once | RESPIRATORY_TRACT | Status: AC
  Administered 2011-01-06: 0.5 mg via RESPIRATORY_TRACT
  Filled 2011-01-06: qty 2

## 2011-01-06 MED ORDER — VANCOMYCIN HCL IN DEXTROSE 1-5 GM/200ML-% IV SOLN
1000.0000 mg | Freq: Once | INTRAVENOUS | Status: AC
  Administered 2011-01-06: 1000 mg via INTRAVENOUS
  Filled 2011-01-06: qty 200

## 2011-01-06 MED ORDER — INSULIN ASPART 100 UNIT/ML ~~LOC~~ SOLN
0.0000 [IU] | SUBCUTANEOUS | Status: DC
  Administered 2011-01-06: 2 [IU] via SUBCUTANEOUS
  Administered 2011-01-06: 3 [IU] via SUBCUTANEOUS
  Administered 2011-01-07: 2 [IU] via SUBCUTANEOUS
  Administered 2011-01-07 (×3): 1 [IU] via SUBCUTANEOUS
  Administered 2011-01-08 (×2): 2 [IU] via SUBCUTANEOUS
  Administered 2011-01-08 – 2011-01-10 (×4): 1 [IU] via SUBCUTANEOUS
  Filled 2011-01-06: qty 3

## 2011-01-06 MED ORDER — FENTANYL CITRATE 0.05 MG/ML IJ SOLN
12.5000 ug | INTRAMUSCULAR | Status: DC | PRN
  Administered 2011-01-06: 12.5 ug via INTRAVENOUS
  Administered 2011-01-08 – 2011-01-10 (×3): 25 ug via INTRAVENOUS
  Filled 2011-01-06 (×4): qty 2

## 2011-01-06 NOTE — Progress Notes (Signed)
Melanie Bowen/Rabab Currington RN's wasted in sink 45ml of Midazolam, of Fentanyl as per protocol.

## 2011-01-06 NOTE — Clinical Documentation Improvement (Signed)
    SHOCK DOCUMENTATION CLARIFICATION QUERY  THIS DOCUMENT IS NOT A PERMANENT PART OF THE MEDICAL RECORD  TO RESPOND TO THE THIS QUERY, FOLLOW THE INSTRUCTIONS BELOW:  1. If needed, update documentation for the patient's encounter via the notes activity.  2. Access this query again and click edit on the [employer].  3. After updating, or not, click F2 to complete all highlighted (required) fields concerning your review. Select "additional documentation in the medical record" OR "no additional documentation provided".  4. Click Sign note button.  5. The deficiency will fall out of your InBasket *Please let us know if you are not able to compete this workflow by phone or e-mail (listed below).  Please update your documentation within the medical record to reflect your response to this query.                                                                                        01/06/11   Dear Dr.Demarri Elie,  In a better effort to capture your patient's severity of illness, reflect appropriate length of stay and utilization of resources, a review of the patient medical record has revealed the following indicators.      Possible Clinical Conditions?  " Septic Shock  " Cardiogenic Shock  " Hypovolemic Shock  " Hemorrhagic Shock  " Other Condition_______________  " Cannot Clinically determine   Diagnostics:  Shock noted per 12/7  progress note. Please specify type of shock in the progress notes.   You may use possible, probable, or suspect with inpatient documentation. possible, probable, suspected diagnoses MUST be documented at the time of discharge  Reviewed: additional documentation in the medical record  Thank You,  Marciano Sequin,  Clinical Documentation Specialist:  Pager: [phone]  [facility] Information Management [place]

## 2011-01-06 NOTE — Progress Notes (Signed)
ANTICOAGULATION CONSULT NOTE - Follow Up Consult  Pharmacy Consult for Heparin Indication: DVT, STEMI  No Known Allergies  Patient Measurements: Height: 5' (152.4 cm) Weight: 110 lb 0.2 oz (49.9 kg) IBW/kg (Calculated) : 45.5  Adjusted Body Weight:   Vital Signs: Temp: 100.4 F (38 C) (12/10 0737) Temp src: Axillary (12/10 0737) BP: 97/62 mmHg (12/10 0830) Pulse Rate: 86  (12/10 0830)  Labs:  Basename 01/06/11 0504 01/05/11 0500 01/04/11 2001 01/04/11 1300 01/04/11 0500 01/03/11 2157 01/03/11 2009  HGB 10.2* 11.0* -- -- -- -- --  HCT 31.4* 32.3* -- -- 35.1* -- --  PLT 500* 372 -- -- 386 -- --  APTT -- -- -- -- 70* 32 37  LABPROT 19.7* 19.8* -- -- 19.5* -- --  INR 1.64* 1.65* -- -- 1.62* -- --  HEPARINUNFRC 0.44 0.51 0.27* -- -- -- --  CREATININE 0.67 0.53 -- -- 0.57 -- --  CKTOTAL -- -- -- 1572* 1735* -- --  CKMB -- -- -- 154.8* 184.5* -- --  TROPONINI -- -- -- >25.00* >25.00* -- --   Estimated Creatinine Clearance: 49.7 ml/min (by C-G formula based on Cr of 0.67).   Medications:  Heparin 900 units/hr  Assessment: 64yof on heparin for recent DVT and STEMI. Heparin level (0.44) is therapeutic on current rate. Coumadin is still on hold.  - H/H slight drop, Plts 500 - No significant bleeding complications reported  Goal of Therapy:  Heparin level 0.3-0.7 units/ml   Plan:  1. Continue heparin 900 units/hr  (9 ml/hr) 2. Follow-up AM heparin level and anticoagulation plans  [name] [phone] 01/06/2011,9:27 AM

## 2011-01-06 NOTE — Progress Notes (Signed)
Subjective: 2 Days Post-Op Procedure(s) (LRB): LEFT HEART CATHETERIZATION WITH CORONARY ANGIOGRAM (N/A)    Pt in 2304  Appears comfortable No new ortho issues  Objective: Current Vitals Blood pressure 117/74, pulse 96, temperature 100 F (37.8 C), temperature source Axillary, resp. rate 20, height 5' (1.524 m), weight 49.9 kg (110 lb 0.2 oz), SpO2 100.00%. Vital signs in last 24 hours: Temp:  [98.4 F (36.9 C)-100.4 F (38 C)] 100 F (37.8 C) (12/10 1154) Pulse Rate:  [68-101] 96  (12/10 1138) Resp:  [14-20] 20  (12/10 1138) BP: (65-124)/(51-84) 117/74 mmHg (12/10 1138) SpO2:  [100 %] 100 % (12/10 1138) FiO2 (%):  [30 %] 30 % (12/10 1007) Weight:  [49.9 kg (110 lb 0.2 oz)] 110 lb 0.2 oz (49.9 kg) (12/10 0400)  Intake/Output from previous day: 12/09 0701 - 12/10 0700 In: 2164.8 [I.V.:1144.8; NG/GT:620; IV Piggyback:400] Out: 735 [Urine:635; Emesis/NG output:100]  LABS  Basename 01/06/11 0504 01/05/11 0500 01/04/11 0500 01/03/11 2157 01/03/11 2009  HGB 10.2* 11.0* 12.4 8.2* 5.9*    Basename 01/06/11 0504 01/05/11 0500  WBC 14.5* 16.3*  RBC 3.68* 3.88  HCT 31.4* 32.3*  PLT 500* 372    Basename 01/06/11 0504 01/05/11 0500  NA 139 141  K 3.5 3.2*  CL 105 106  CO2 26 25  BUN 15 12  CREATININE 0.67 0.53  GLUCOSE 138* 135*  CALCIUM 8.6 8.7    Basename 01/06/11 0504 01/05/11 0500  LABPT -- --  INR 1.64* 1.65*      Physical Exam  Gen: NAD Abd: + BS Ext: Dsg R leg c/d/i  Ext warm  Swelling controlled  Motor grossly intact  Minimal participation with exam today   Imaging Dg Wrist 2 Views Left  01/05/2011  *RADIOLOGY REPORT*  Clinical Data: Swollen wrist following cardiac arrest  LEFT WRIST - 2 VIEW  Comparison: None.  Findings: No fracture or dislocation is seen.  The joint spaces are preserved.  The visualized soft tissues are unremarkable.  IMPRESSION: No acute osseous abnormality is seen.  Original Report Authenticated By: Charline Bills, M.D.   Dg  Hand 2 View Left  01/05/2011  *RADIOLOGY REPORT*  Clinical Data: Swollen hand after cardiac arrest  LEFT HAND - 2 VIEW  Comparison: None.  Findings: Mild degenerate changes of the first IP joint and second/fifth DIP joints.  No fracture or dislocation is seen.  Mild diffuse soft tissue swelling.  IMPRESSION: No fracture or dislocation is seen.  Mild diffuse soft tissue swelling.  Degenerative changes.  Original Report Authenticated By: Charline Bills, M.D.   Dg Chest Port 1 View  01/06/2011  *RADIOLOGY REPORT*  Clinical Data: Check endotracheal tube.  PORTABLE CHEST - 1 VIEW  Comparison: 01/05/2011  Findings: Endotracheal tube is 2.7 cm above the carina.  Central venous catheter near the cavoatrial junction.  Nasogastric tube extends into the abdomen.  Lungs are clear except for haziness at the right lung base which could represent atelectasis.  No evidence for a pneumothorax.  IMPRESSION: Probable right basilar atelectasis.  Original Report Authenticated By: Richarda Overlie, M.D.   Dg Chest Port 1 View  01/05/2011  *RADIOLOGY REPORT*  Clinical Data: Respiratory difficulty  PORTABLE CHEST - 1 VIEW  Comparison: 01/03/2011  Findings: Endotracheal tube, NG tube, left internal jugular vein central venous catheter stable.  Lungs grossly clear.  Upper normal heart size.  Bronchitic changes.  No pneumothorax.  IMPRESSION: Stable support structures and no consolidation.  Original Report Authenticated By: Donavan Burnet, M.D.  Assessment/Plan: 2 Days Post-Op Procedure(s) (LRB): LEFT HEART CATHETERIZATION WITH CORONARY ANGIOGRAM (N/A)  65 y/o female with subacute R distal femur complicated by acute MI  1. R distal femur fx, subacute, s/p IMN  TDWB when ok with primary service and CCM to begin therapy  ROM as tolerated  Dsg changes as needed  TED R leg 2. Cardiorespiratory arrest with AMI  Per CCM, cards 3. DVT  Heparin 4. Cont per primary team 5. dispo  Therapies when stable  Will follow along    Mearl Latin, PA-C 01/06/2011, 12:37 PM

## 2011-01-06 NOTE — Plan of Care (Signed)
Orders gone through by nursing and duplicate/older non applicable orders modified and discontinued as per scope of practice.  Pt orders and vital assessments now as per ICU protocol. Duplicate orders d/c'd.

## 2011-01-06 NOTE — Procedures (Signed)
Extubation Procedure Note  Patient Details:   Name: [name] DOB: 17-Dec-1945 MRN: [id number]   Airway Documentation:     Evaluation  O2 sats: stable throughout Complications: No apparent complications Patient did tolerate procedure well. Bilateral Breath Sounds: Diminished Suctioning: Airway Yes  Pt extubated to 4L [place] at this time per MD order with MD at bedside. Pt tolerated well. Pt has minimal stridor. MD aware and ordered a one time dose of Pulmicort due to cannot give Racemic. VS are stable with HR 85, RR 25, BBS clear/dim, BP 117/74, Spo2 100%. No complications noted. Pt has adequate cough and able to vocalize. RT will continue to monitor.   Loyal Jacobson [facility] 01/06/2011, 12:26 PM

## 2011-01-06 NOTE — Progress Notes (Signed)
This note also relates to the following rows which could not be included: BP - Cannot attach notes to rows marked as read only MAP (mmHg) - Cannot attach notes to rows marked as read only

## 2011-01-06 NOTE — Progress Notes (Signed)
CSW picking up pt from Hospitalist CSW. Spoke with RN who states pt successfully extubated today and stable. CSW will continue to follow for d/c planning and to provide support to pt and pt family.  Baxter Flattery, MSW [phone]

## 2011-01-06 NOTE — Clinical Documentation Improvement (Signed)
NOte this pt had cardiogenic shock.  [name]

## 2011-01-06 NOTE — Progress Notes (Signed)
Name: [name] MRN: [id number] DOB: Oct 20, 1945    LOS: 5  PCCM PROGRESS  NOTE  Patient Profile:  65 y/o F with PMHx of HTN suffered fall on 12/24/10 and LE pain. Initially taken to [facility] and diagnosed with R femoral DVT and started on anticoagulation. Pt had persistent pain and she had further imaging studies which showed R femoral fracture. Pt was transferred to [facility] for further management and was taken to the OR on 01/03/11 and underwent Open reduction and internal fixation of the right femur fracture. Post op suffered episode of Torsades and underwent 7-10 mins CPR. Subsequently developed AFRVR and hypotension. EKG demonstrated ST elevation on the inferior leads. Underwent cardiac cath 12/7. PCCM asked to assist with vent/CCM issues   Lines / Drains: L IJ CVL 12/7 >> ETT 12/7 >> 12/10  Cultures: Urine 12/10 >> Resp 12/10 >>  Antibiotics: Vanc 12/10 >> one dose pending GS Ceftaz (fever, purulent sputum, cloudy urine) 12/10 >>  Tests / Event 12/7: ORIF R femur fx 11/28LE venous dopplers: Occlusive right femoropopliteal DVT 12/8 Cath: Severe 95% thrombotic lesion in the mid right coronary artery which was successfully stented with a bare metal stent, postdilated to greater than 3 mm in diameter. Severe left ventricular dysfunction with inferior apical and distal anterior hypokinesis of severe degree. The estimated ejection fraction is 25%. 12/8 CT head:  No acute abnormalities 12/8 Echo: Systolic function was moderately to severely reduced. The estimated ejection fraction was in the range of 30% to 35%. There is akinesis of the apical myocardium. There is akinesis of the inferoposterior myocardium. There is hypokinesis of the lateral myocardium    SUBJ: RASS -1. + F/C. MAEs. Mild purulent resp secretions. Adequate cough with ETS. Now extubated. Cough marginal post extubation. Mild stridor post extubation   OBJ: Vital Signs: Temp:  [98.4 F (36.9 C)-100.4 F (38 C)] 100 F  (37.8 C) (12/10 1154) Pulse Rate:  [68-101] 90  (12/10 1245) Resp:  [14-29] 29  (12/10 1245) BP: (65-124)/(46-84) 102/46 mmHg (12/10 1245) SpO2:  [99 %-100 %] 100 % (12/10 1245) FiO2 (%):  [30 %] 30 % (12/10 1007) Weight:  [49.9 kg (110 lb 0.2 oz)] 110 lb 0.2 oz (49.9 kg) (12/10 0400) I/O last 3 completed shifts: In: 2827.6 [I.V.:1787.6; NG/GT:640; IV Piggyback:400] Out: 1015 [Urine:915; Emesis/NG output:100]  Physical Examination: General:  RASS - 1. + F/C Neuro:  MAEs   HEENT/NECK:  No JVD,  No TMG   Cardiovascular:  Tachy, nl s1/s2 no s3/s4 Lungs:  Clear anteriorly Abdomen:  Soft NT BS decreased Musculoskeletal: L wrist swollen Skin:  clear  Ventilator settings: Vent Mode:  [-] CPAP;PSV FiO2 (%):  [30 %] 30 % Set Rate:  [15 bmp] 15 bmp Vt Set:  [40 mL-400 mL] 40 mL PEEP:  [5 cmH20] 5 cmH20 Pressure Support:  [5 cmH20] 5 cmH20 Plateau Pressure:  [13 cmH20-16 cmH20] 13 cmH20  Labs and Imaging:   CXR: RLL inf vs atx  Assessment and Plan: Patient is a 65 y/o F with PMHx of HTN, DL who was diagnosed with R femoral DVT, started on anticoagulation and R femoral fracture s/p ORIF of the right femur fracture and on the post op period developed cardioresp arrest s/p CPR with cardiac arrhythmias (torsades, then afib with RVR and VT).  1. S/p cardirespiratory arrest due to ischemic Torsade - IWMI, s/p bare metal stent -Mgmt per Cards  2. Hypotension -  Suspect mostly cardiogenic -Wean phenylephrine for MAP > 60 mmHg  3. A fib with RVR  - sp cardioversion  - cont amiod drip - further mgmt per Cards  4. R low extrem DVT  -cont hep - follow CBC   5. Anemia   Lab 01/06/11 0504 01/05/11 0500 01/04/11 0500 01/03/11 2157 01/03/11 2009  HCT 31.4* 32.3* 35.1* 25.5* 18.2*   No indication for PRBCs at present  6. Hypokalemia   Lab 01/06/11 0504 01/05/11 0500 01/04/11 0500 01/03/11 2157 01/03/11 2009  K 3.5 3.2* 3.5 3.1* 2.4*  Resolved, monitor  7. Low grade fever -    Purulent secretions, possible PNA and cloudy urine - ETS sent, Urine cx ordered - Vanc X 1 dose - Ceftaz  8. Post-ext stridor -No racemic epi post recent MI!! - Nebulized steroids and single dose of methylpred ordered  -The patient is critically ill with multiple organ systems failure and requires high complexity decision making for assessment and support, frequent evaluation and titration of therapies, application of advanced monitoring technologies and extensive interpretation of multiple databases. Critical Care Time devoted to patient care services described in this note is 40 minutes.  Billy Fischer, MD;  PCCM service; Mobile [phone]  01/06/2011, 12:58 PM

## 2011-01-06 NOTE — Op Note (Signed)
[name], [name]              ACCOUNT NO.:  [id number]  MEDICAL RECORD NO.:  [id number]  LOCATION:                                 FACILITY:  PHYSICIAN:  Doralee Albino. Carola Frost, M.D. DATE OF BIRTH:  1945/02/21  DATE OF PROCEDURE:  01/03/2011 DATE OF DISCHARGE:                              OPERATIVE REPORT   PREOPERATIVE DIAGNOSIS:  Subacute right femoral shaft fracture.  POSTOPERATIVE DIAGNOSIS:  Subacute right femoral shaft fracture.  PROCEDURE:  Intramedullary nailing of the right femur with a retrograde Biomet 10.5 x 340 mm statically locked nail.  SURGEON:  Doralee Albino. Carola Frost, M.D.  ASSISTING:  Montez Morita, PA-C.  ANESTHESIA:  General.  COMPLICATIONS:  None.  ESTIMATED BLOOD LOSS:  Less than 100 mL of all reamings and hematoma.  DISPOSITION:  To the PACU.  CONDITION:  Stable.  BRIEF SUMMARY AND INDICATIONS FOR PROCEDURE:  [name] is a very pleasant female, who was noted to have leg swelling back on November 27, at which time she underwent an ultrasound, which was positive for DVT. No x-rays were obtained at that time.  On subsequent follow up, it was notable for an associated femoral fracture.  She was transferred from outside facility to [facility] where further treatment was expected. This was because of the delayed nature of the treatment and the presumed significant amount of shortening that had occurred over the previous 8 days.  She was on a therapeutic level of blood thinners and presents for internal fixation.  I discussed with her the risks and benefits of surgery including possibility of failure to obtain a complete reduction, the possibility of persistent shortening, nonunion and need for further surgery, exacerbation of her DVT and PE, heart attack, stroke, infection, nerve injury, vessel injury, multiple others.  She did wish to proceed and  understood the increased difficulty given the subacute presentation.  PROCEDURE:  [name] was taken to  the operating room, where general anesthesia was induced.  Her right lower extremity was carefully prepped and draped in usual sterile fashion.  A series of bumps in the radiolucent triangle was brought in an attempt to see if we could restore appropriate length.  By the constant help and assistance of my PA, we were able to restore length, but this was against considerable resistance and had to be maintained with maximal manual traction throughout.  I then also had to make a series of percutaneous incisions through which instruments were placed including the King-Tong clamp, the ball-spike pusher and the bone-hook in order to produce and maintain a reduction again given the subacute nature of this presentation.  This increased the operative time required over standard intramedullary nail by at least to 100%.  Following reduction the femur, we sequentially reamed up to 12, a 10.5 mm nail place locked distally with 3 static locks and proximally with 2 static locks using perfect circle technique. Final images showed appropriate reduction, restoration of length and alignment.  The knee was stable to the gross examination of varus valgus and anterior-posterior stress.  Wounds were irrigated thoroughly and closed in standard layered fashion.  Sterile dressing was applied from foot to thigh.  The patient awakened from  anesthesia and transported PACU in stable condition.  PROGNOSIS:  [name] will need to be partial weightbearing because of the oblique pattern and the metaphyseal location, which does not provide for any intrinsic stability.  Furthermore, her bone quality is impaired and she will remain under the care of the Medical Service.  We will allow her to begin immediate unrestricted active and passive motion of the knee, ankle and hip.  We would anticipate her returning to the office in 2 weeks for removal of her sutures.     Doralee Albino. Carola Frost, M.D.     MHH/MEDQ  D:   01/03/2011  T:  01/03/2011  Job:  [id number]

## 2011-01-06 NOTE — Progress Notes (Signed)
CSW observed that patient is now on PCCM service line. CSW will transfer patient to PCCM CSW. Dtr Neysa Bonito) called CSW and explained that no decision had been made regarding HH vs SNF due to patient having additional medical problems over the weekend. Dtr stated they are taking it a day at a time to determine needs. This CSW is signing off but PCCM CSW will follow. [place], [place] [phone]

## 2011-01-06 NOTE — Progress Notes (Signed)
PT Cancellation Note  Treatment cancelled today due to medical issues with patient which prohibited therapy.  Patient just extubated on arrival at 1100 am.  Will return in am to evaluate as able.  Thanks. [facility] [phone] (pager)

## 2011-01-07 ENCOUNTER — Encounter (HOSPITAL_COMMUNITY): Payer: Self-pay | Admitting: Orthopedic Surgery

## 2011-01-07 DIAGNOSIS — J9600 Acute respiratory failure, unspecified whether with hypoxia or hypercapnia: Secondary | ICD-10-CM

## 2011-01-07 DIAGNOSIS — R579 Shock, unspecified: Secondary | ICD-10-CM

## 2011-01-07 DIAGNOSIS — I469 Cardiac arrest, cause unspecified: Secondary | ICD-10-CM

## 2011-01-07 LAB — GLUCOSE, CAPILLARY
Glucose-Capillary: 147 mg/dL — ABNORMAL HIGH (ref 70–99)
Glucose-Capillary: 126 mg/dL — ABNORMAL HIGH (ref 70–99)
Glucose-Capillary: 143 mg/dL — ABNORMAL HIGH (ref 70–99)
Glucose-Capillary: 145 mg/dL — ABNORMAL HIGH (ref 70–99)

## 2011-01-07 LAB — CBC
HCT: 26.4 % — ABNORMAL LOW (ref 36.0–46.0)
Hemoglobin: 8.8 g/dL — ABNORMAL LOW (ref 12.0–15.0)
MCV: 84.3 fL (ref 78.0–100.0)
Platelets: 393 10*3/uL (ref 150–400)
RDW: 17.1 % — ABNORMAL HIGH (ref 11.5–15.5)
RDW: 17.1 % — ABNORMAL HIGH (ref 11.5–15.5)
WBC: 10 10*3/uL (ref 4.0–10.5)
WBC: 10.9 10*3/uL — ABNORMAL HIGH (ref 4.0–10.5)

## 2011-01-07 LAB — HEPARIN LEVEL (UNFRACTIONATED): Heparin Unfractionated: 0.36 IU/mL (ref 0.30–0.70)

## 2011-01-07 MED ORDER — INSULIN ASPART 100 UNIT/ML ~~LOC~~ SOLN
0.0000 [IU] | Freq: Every day | SUBCUTANEOUS | Status: DC
  Filled 2011-01-07: qty 3

## 2011-01-07 MED ORDER — INSULIN ASPART 100 UNIT/ML ~~LOC~~ SOLN
3.0000 [IU] | Freq: Three times a day (TID) | SUBCUTANEOUS | Status: DC
Start: 2011-01-07 — End: 2011-01-08
  Administered 2011-01-07 (×2): 3 [IU] via SUBCUTANEOUS
  Filled 2011-01-07: qty 3

## 2011-01-07 MED ORDER — HEPARIN SOD (PORCINE) IN D5W 100 UNIT/ML IV SOLN
1050.0000 [IU]/h | INTRAVENOUS | Status: DC
  Administered 2011-01-08 – 2011-01-09 (×2): 950 [IU]/h via INTRAVENOUS
  Administered 2011-01-10 – 2011-01-15 (×5): 1050 [IU]/h via INTRAVENOUS
  Filled 2011-01-07 (×11): qty 250

## 2011-01-07 MED ORDER — INSULIN ASPART 100 UNIT/ML ~~LOC~~ SOLN
0.0000 [IU] | Freq: Three times a day (TID) | SUBCUTANEOUS | Status: DC
  Administered 2011-01-07 (×2): 1 [IU] via SUBCUTANEOUS
  Administered 2011-01-08: 2 [IU] via SUBCUTANEOUS
  Filled 2011-01-07: qty 3

## 2011-01-07 MED ORDER — ALBUTEROL SULFATE (5 MG/ML) 0.5% IN NEBU
2.5000 mg | INHALATION_SOLUTION | RESPIRATORY_TRACT | Status: DC | PRN
  Administered 2011-01-07: 2.5 mg via RESPIRATORY_TRACT
  Filled 2011-01-07 (×2): qty 0.5

## 2011-01-07 MED FILL — Dextrose Inj 5%: INTRAVENOUS | Qty: 50 | Status: AC

## 2011-01-07 NOTE — Progress Notes (Signed)
Physical Therapy Evaluation Patient Details Name: [name] MRN: [id number] DOB: August 29, 1945 Today's Date: 01/07/2011  Problem List:  Patient Active Problem List  Diagnoses  . HTN (hypertension)  . Femoral DVT (deep venous thrombosis)  . Anemia  . Hyponatremia  . Vitamin B12 deficiency (dietary) anemia  . Lupus anticoagulant positive  . Femur fracture, right  . Osteoporosis  . Cardiogenic shock    Past Medical History:  Past Medical History  Diagnosis Date  . Hypertension   . 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 Vitamin B12 deficiency (dietary) anemia 12/27/2010  . Arthritis    Past Surgical History:  Past Surgical History  Procedure Date  . Abdominal hysterectomy     06/2010  . Femur im nail 01/03/2011    Procedure: INTRAMEDULLARY (IM) RETROGRADE FEMORAL NAILING;  Surgeon: Budd Palmer;  Location: [facility] OR;  Service: Orthopedics;  Laterality: Right;  Right IM Retrograde Femoral Nailing    PT Assessment/Plan/Recommendation PT Assessment Clinical Impression Statement: Pt is a 65 y/o female admitted s/p right LE ORIF extubated 01/06/11.  Pt presents with the below PT problem list and would benfit from acute PT to maximize independence while facilitating d/c to SNF for further therapy. PT Recommendation/Assessment: Patient will need skilled PT in the acute care venue PT Problem List: Decreased strength;Decreased range of motion;Decreased activity tolerance;Decreased balance;Decreased mobility;Decreased knowledge of use of DME;Decreased knowledge of precautions;Pain Barriers to Discharge: Decreased caregiver support PT Therapy Diagnosis : Difficulty walking;Acute pain PT Plan PT Frequency: Min 3X/week PT Treatment/Interventions: DME instruction;Gait training;Functional mobility training;Therapeutic activities;Therapeutic exercise;Balance training;Cognitive remediation;Patient/family education PT Recommendation Follow Up Recommendations: Skilled nursing  facility Equipment Recommended: Defer to next venue PT Goals  Acute Rehab PT Goals PT Goal Formulation: With patient Time For Goal Achievement: 2 weeks Pt will go Supine/Side to Sit: with min assist PT Goal: Supine/Side to Sit - Progress: Other (comment) (Set today.) Pt will go Sit to Supine/Side: with min assist PT Goal: Sit to Supine/Side - Progress: Other (comment) (Set today.) Pt will go Sit to Stand: with min assist PT Goal: Sit to Stand - Progress: Other (comment) (Set today.) Pt will go Stand to Sit: with min assist PT Goal: Stand to Sit - Progress: Other (comment) (Set today.) Pt will Transfer Bed to Chair/Chair to Bed: with min assist PT Transfer Goal: Bed to Chair/Chair to Bed - Progress: Other (comment) (Set today.) Pt will Ambulate: 16 - 50 feet;with min assist;with least restrictive assistive device PT Goal: Ambulate - Progress: Other (comment) (Set today.) Pt will Perform Home Exercise Program: with min assist PT Goal: Perform Home Exercise Program - Progress: Other (comment) (Set today.)  PT Evaluation Precautions/Restrictions  Precautions Precautions: Fall Required Braces or Orthoses: No Restrictions Weight Bearing Restrictions: Yes RLE Weight Bearing: Touchdown weight bearing Other Position/Activity Restrictions: Right LE ROM without limitations. Prior Functioning  Home Living Lives With: Alone Receives Help From: Family Type of Home: House Home Layout: Two level;Able to live on main level with bedroom/bathroom Alternate Level Stairs-Rails: Right Alternate Level Stairs-Number of Steps: full flight-doesn't have to go upstairs Home Access: Stairs to enter Entrance Stairs-Rails: Right Entrance Stairs-Number of Steps: 2 Home Adaptive Equipment: None Additional Comments: Pt was recently d/c'ed to a SNF from original fall and readmitted to [facility] for surgery. Prior Function Level of Independence: Independent with basic ADLs;Independent with homemaking  with ambulation;Independent with gait;Independent with transfers Able to Take Stairs?: Yes Driving: Yes Vocation: Part time employment Comments: PLOF refers to before orginal fall and  admit on 12/27/10. Cognition Cognition Arousal/Alertness: Awake/alert Overall Cognitive Status: Impaired Orientation Level: Oriented to person;Oriented to situation Following Commands: Follows one step commands with increased time Sensation/Coordination Sensation Light Touch: Appears Intact Stereognosis: Not tested Hot/Cold: Not tested Proprioception: Not tested Coordination Gross Motor Movements are Fluid and Coordinated: Yes Fine Motor Movements are Fluid and Coordinated: Not tested Extremity Assessment RUE Assessment RUE Assessment: Within Functional Limits LUE Assessment LUE Assessment: Within Functional Limits (Except hand with premorbid deficits.) RLE Assessment RLE Assessment: Exceptions to [facility] RLE PROM (degrees) Right Knee Flexion 0-140: 90  RLE Strength RLE Overall Strength: Deficits;Due to pain RLE Overall Strength Comments: 2/5 LLE Assessment LLE Assessment: Within Functional Limits Pain 7/10 in right LE.  Pt repositioned and RN notified. Mobility (including Balance) Bed Mobility Bed Mobility: Yes Supine to Sit: 3: Mod assist;HOB flat Supine to Sit Details (indicate cue type and reason): Assist for right LE and trunk to facilitate motion to EOB.  Max cues for sequence. Sitting - Scoot to Edge of Bed: Not tested (comment) Transfers Transfers: Yes Sit to Stand: 2: Max assist;From bed (2 trials.) Sit to Stand Details (indicate cue type and reason): Assist for translation of trunk anterior over BOS with protection to right LE to ensure TDWBing.  Max cues for sequence. Stand to Sit: 3: Mod assist;To bed;To chair/3-in-1 (2 trials.) Stand to Sit Details: Assist to control eccentric descent to chair with protection to right LE to ensure TDWBing.  Max cues for sequence and hand/right LE  placement. Stand Pivot Transfers: 3: Mod assist Stand Pivot Transfer Details (indicate cue type and reason): Assist to sacrum to facilitate rotation of pelvis to chair with cues for sequence, continual TDWBing of right LE, and hand placement. Lateral/Scoot Transfers: Not tested (comment) Ambulation/Gait Ambulation/Gait: No Stairs: No Wheelchair Mobility Wheelchair Mobility: No  Posture/Postural Control Posture/Postural Control: Postural limitations Postural Limitations: Trunk/core weakness in sitting causing thrunk to flex and pelvis to posteriorly tilt. Balance Balance Assessed: Yes Static Sitting Balance Static Sitting - Balance Support: Bilateral upper extremity supported;Feet supported Static Sitting - Level of Assistance: 4: Min assist Static Sitting - Comment/# of Minutes: 10 Exercise  Joint Surgery Exercises Ankle Circles/Pumps: AROM;Right;10 reps;Supine Quad Sets: AROM;Right;10 reps;Supine Heel Slides: AAROM;Right;10 reps;Supine Straight Leg Raises: AAROM;Right;10 reps;Supine End of Session PT - End of Session Equipment Utilized During Treatment: Gait belt Activity Tolerance: Patient tolerated treatment well Patient left: in chair;with call bell in reach Nurse Communication: Mobility status for transfers General Behavior During Session: [place] for tasks performed Cognition: [facility] for tasks performed  Cephus Shelling 01/07/2011, 3:30 PM  01/07/2011 Cephus Shelling, PT, DPT [phone]

## 2011-01-07 NOTE — Progress Notes (Signed)
Subjective: 3 Days Post-Op Procedure(s) (LRB): LEFT HEART CATHETERIZATION WITH CORONARY ANGIOGRAM (N/A) .   Much improved Ortho issues stable  Objective: Current Vitals Blood pressure 105/63, pulse 85, temperature 98.2 F (36.8 C), temperature source Oral, resp. rate 17, height 5' (1.524 m), weight 52.7 kg (116 lb 2.9 oz), SpO2 99.00%. Vital signs in last 24 hours: Temp:  [98 F (36.7 C)-99.3 F (37.4 C)] 98.2 F (36.8 C) (12/11 1149) Pulse Rate:  [47-113] 85  (12/11 1400) Resp:  [15-25] 17  (12/11 1500) BP: (78-114)/(33-79) 105/63 mmHg (12/11 1500) SpO2:  [93 %-100 %] 99 % (12/11 1500) Weight:  [52.7 kg (116 lb 2.9 oz)] 116 lb 2.9 oz (52.7 kg) (12/11 0530)  Intake/Output from previous day: 12/10 0701 - 12/11 0700 In: 1832.5 [I.V.:1442.5; NG/GT:40; IV Piggyback:350] Out: 495 [Urine:495]  LABS  Basename 01/07/11 0411 01/06/11 0504 01/05/11 0500  HGB 7.5* 10.2* 11.0*    Basename 01/07/11 0411 01/06/11 0504  WBC 10.0 14.5*  RBC 2.74* 3.68*  HCT 22.9* 31.4*  PLT 393 500*    Basename 01/06/11 0504 01/05/11 0500  NA 139 141  K 3.5 3.2*  CL 105 106  CO2 26 25  BUN 15 12  CREATININE 0.67 0.53  GLUCOSE 138* 135*  CALCIUM 8.6 8.7    Basename 01/06/11 0504 01/05/11 0500  LABPT -- --  INR 1.64* 1.65*      Physical Exam  EAV:[id number] in char Ext: R Leg  + swelling   Dressing stable  Motor and sensory grossly intact   Imaging Dg Chest Port 1 View  01/06/2011  *RADIOLOGY REPORT*  Clinical Data: Check endotracheal tube.  PORTABLE CHEST - 1 VIEW  Comparison: 01/05/2011  Findings: Endotracheal tube is 2.7 cm above the carina.  Central venous catheter near the cavoatrial junction.  Nasogastric tube extends into the abdomen.  Lungs are clear except for haziness at the right lung base which could represent atelectasis.  No evidence for a pneumothorax.  IMPRESSION: Probable right basilar atelectasis.  Original Report Authenticated By: Richarda Overlie, M.D.     Assessment/Plan: 3 Days Post-Op Procedure(s) (LRB): LEFT HEART CATHETERIZATION WITH CORONARY ANGIOGRAM (N/A)  65 y/o female with subacute R distal femur fx complicated by acute MI  1. R distal femur fx, subacute, s/p acute MI  TDWB  ROM as tolerated  TED hose 2. Cardioresp. Arrest with AMI s/p bare metal stent  Per CCM and cards 3. ABL anemia  Per primary service  Follow closely 4. DVT  Heparin 5. dispo  PT/OT  Ortho issues stable   Mearl Latin, PA-C 01/07/2011, 3:33 PM

## 2011-01-07 NOTE — Progress Notes (Signed)
Name: Heena Woodbury MRN: [id number] DOB: 30-Sep-1945    LOS: 6  PCCM PROGRESS  NOTE  Patient Profile:  65 y/o F with PMHx of HTN suffered fall on 12/24/10 and LE pain. Initially taken to [facility] and diagnosed with R femoral DVT and started on anticoagulation. Pt had persistent pain and she had further imaging studies which showed R femoral fracture. Pt was transferred to [facility] for further management and was taken to the OR on 01/03/11 and underwent Open reduction and internal fixation of the right femur fracture. Post op suffered episode of Torsades and underwent 7-10 mins CPR. Subsequently developed AFRVR and hypotension. EKG demonstrated ST elevation on the inferior leads. Underwent cardiac cath 12/7. PCCM asked to assist with vent/CCM issues   Lines / Drains: L IJ CVL 12/7 >> ETT 12/7 >> 12/10  Cultures: Urine 12/10 >> UA negative Resp 12/10 >> NOS >>  Antibiotics: Vanc 12/10 >> 12/11 Ceftaz (fever, purulent sputum, cloudy urine) 12/10 >>  Tests / Event 12/7: ORIF R femur fx 11/28LE venous dopplers: Occlusive right femoropopliteal DVT 12/8 Cath: Severe 95% thrombotic lesion in the mid right coronary artery which was successfully stented with a bare metal stent, postdilated to greater than 3 mm in diameter. Severe left ventricular dysfunction with inferior apical and distal anterior hypokinesis of severe degree. The estimated ejection fraction is 25%. 12/8 CT head:  No acute abnormalities 12/8 Echo: Systolic function was moderately to severely reduced. The estimated ejection fraction was in the range of 30% to 35%. There is akinesis of the apical myocardium. There is akinesis of the inferoposterior myocardium. There is hypokinesis of the lateral myocardium    SUBJ: A little rough after extubation 12/10. Now looks great. Cognition intact. No distress. Remains on low dose pressors   OBJ: Vital Signs: Temp:  [98 F (36.7 C)-100 F (37.8 C)] 98.2 F (36.8 C) (12/11 0714) Pulse Rate:   [47-113] 75  (12/11 1000) Resp:  [15-29] 21  (12/11 1000) BP: (78-128)/(33-79) 114/67 mmHg (12/11 1000) SpO2:  [93 %-100 %] 100 % (12/11 1000) FiO2 (%):  [40 %] 40 % (12/10 1520) Weight:  [52.7 kg (116 lb 2.9 oz)] 116 lb 2.9 oz (52.7 kg) (12/11 0530) I/O last 3 completed shifts: In: 3288.3 [I.V.:2468.3; NG/GT:470; IV Piggyback:350] Out: 710 [Urine:710]  Physical Examination: General:  RASS - 1. + F/C Neuro:  MAEs   HEENT/NECK:  No JVD,  No TMG   Cardiovascular:  Tachy, nl s1/s2 no s3/s4 Lungs:  Clear anteriorly Abdomen:  Soft NT BS decreased Musculoskeletal: L wrist swollen Skin:  clear  Ventilator settings: Vent Mode:  [-]  FiO2 (%):  [40 %] 40 %  Labs and Imaging:   CXR: RLL inf vs atx  Assessment and Plan: Patient is a 65 y/o F with PMHx of HTN, DL who was diagnosed with R femoral DVT, started on anticoagulation and R femoral fracture s/p ORIF of the right femur fracture and on the post op period developed cardioresp arrest s/p CPR with cardiac arrhythmias (torsades, then afib with RVR and VT).  1. S/p cardiac arrest due to ischemic Torsade - IWMI, s/p bare metal stent -Mgmt per Cards -Begin PT  2. Hypotension -  Suspect mostly cardiogenic -Cont to wean phenylephrine for MAP > 60 mmHg  3. A fib with RVR  - sp cardioversion  - ? Convert amio gtt to PO - will leave to Cards to decide - further mgmt per Cards  4. R low extrem DVT  -cont hep -  follow CBC   5. Anemia   Lab 01/07/11 0411 01/06/11 0504 01/05/11 0500 01/04/11 0500 01/03/11 2157  HCT 22.9* 31.4* 32.3* 35.1* 25.5*   Hgb has precipitously dropped today, ?spurious. Recheck CBC this afternoon. Transfuse for Hgb < 7.0 gm/dL  6. Hypokalemia  Resolved, monitor  7. Low grade fever -  Purulent secretions - much improved, UA negative  -Cont ceftaz  8. Post-ext stridor -Resolved  -The patient is critically ill with multiple organ systems failure and requires high complexity decision making for assessment  and support, frequent evaluation and titration of therapies, application of advanced monitoring technologies and extensive interpretation of multiple databases. Critical Care Time devoted to patient care services described in this note is 30 minutes.  Billy Fischer, MD;  PCCM service; Mobile [phone]  01/07/2011, 10:32 AM

## 2011-01-07 NOTE — Progress Notes (Signed)
ANTICOAGULATION CONSULT NOTE - Follow Up Consult  Pharmacy Consult for Heparin Indication: DVT, STEMI  No Known Allergies  Patient Measurements: Height: 5' (152.4 cm) Weight: 116 lb 2.9 oz (52.7 kg) IBW/kg (Calculated) : 45.5  Adjusted Body Weight:   Vital Signs: Temp: 98.3 F (36.8 C) (12/11 1555) Temp src: Oral (12/11 1555) BP: 103/61 mmHg (12/11 1600) Pulse Rate: 87  (12/11 1600)  Labs:  Basename 01/07/11 1542 01/07/11 0411 01/06/11 0504 01/05/11 0500  HGB 8.8* 7.5* -- --  HCT 26.4* 22.9* 31.4* --  PLT 465* 393 500* --  APTT -- -- -- --  LABPROT -- -- 19.7* 19.8*  INR -- -- 1.64* 1.65*  HEPARINUNFRC 0.31 0.36 0.44 --  CREATININE -- -- 0.67 0.53  CKTOTAL -- -- -- --  CKMB -- -- -- --  TROPONINI -- -- -- --   Estimated Creatinine Clearance: 49.7 ml/min (by C-G formula based on Cr of 0.67).   Medications:  Heparin 900 units/hr  Assessment: [name] on heparin for recent DVT and STEMI. Heparin level (0.36) and recheck heparin level (0.31) are therapeutic but continue to trend down. Will increase rate to prevent from falling subtherapeutic and follow-up AM heparin level.  - No significant bleeding reported  Goal of Therapy:  Heparin level 0.3-0.7 units/ml   Plan:  1. Increase heparin drip to 950 units/hr (9.5 ml/hr) 2. Follow-up AM heparin level and anticoagulation plans  [name] [phone] 01/07/2011,4:49 PM

## 2011-01-08 ENCOUNTER — Other Ambulatory Visit: Payer: Self-pay

## 2011-01-08 ENCOUNTER — Inpatient Hospital Stay (HOSPITAL_COMMUNITY): Payer: Medicare Other

## 2011-01-08 DIAGNOSIS — Z9911 Dependence on respirator [ventilator] status: Secondary | ICD-10-CM

## 2011-01-08 DIAGNOSIS — R570 Cardiogenic shock: Secondary | ICD-10-CM

## 2011-01-08 LAB — POCT I-STAT 3, ART BLOOD GAS (G3+)
Bicarbonate: 20.4 mEq/L (ref 20.0–24.0)
O2 Saturation: 84 %
Patient temperature: 37
TCO2: 18 mmol/L (ref 0–100)
pCO2 arterial: 26.9 mmHg — ABNORMAL LOW (ref 35.0–45.0)
pCO2 arterial: 27.1 mmHg — ABNORMAL LOW (ref 35.0–45.0)
pH, Arterial: 7.486 — ABNORMAL HIGH (ref 7.350–7.400)
pO2, Arterial: 255 mmHg — ABNORMAL HIGH (ref 80.0–100.0)

## 2011-01-08 LAB — CBC
MCH: 27.7 pg (ref 26.0–34.0)
MCHC: 32.8 g/dL (ref 30.0–36.0)
MCV: 84.5 fL (ref 78.0–100.0)
Platelets: 606 10*3/uL — ABNORMAL HIGH (ref 150–400)
RBC: 3.1 MIL/uL — ABNORMAL LOW (ref 3.87–5.11)
RDW: 17 % — ABNORMAL HIGH (ref 11.5–15.5)

## 2011-01-08 LAB — CARBOXYHEMOGLOBIN
Carboxyhemoglobin: 1.3 % (ref 0.5–1.5)
Methemoglobin: 1.1 % (ref 0.0–1.5)
O2 Saturation: 61.2 %
Total hemoglobin: 8.6 g/dL — ABNORMAL LOW (ref 12.5–16.0)

## 2011-01-08 LAB — PROCALCITONIN: Procalcitonin: 2.79 ng/mL

## 2011-01-08 LAB — GLUCOSE, CAPILLARY: Glucose-Capillary: 104 mg/dL — ABNORMAL HIGH (ref 70–99)

## 2011-01-08 LAB — BASIC METABOLIC PANEL
Calcium: 8 mg/dL — ABNORMAL LOW (ref 8.4–10.5)
Creatinine, Ser: 0.58 mg/dL (ref 0.50–1.10)
GFR calc Af Amer: >90 mL/min (ref >90)
GFR calc non Af Amer: >90 mL/min (ref >90)
Sodium: 138 mEq/L (ref 135–145)

## 2011-01-08 MED ORDER — POTASSIUM CHLORIDE 20 MEQ/15ML (10%) PO LIQD
ORAL | Status: AC
  Filled 2011-01-08: qty 30

## 2011-01-08 MED ORDER — POTASSIUM CHLORIDE 20 MEQ/15ML (10%) PO LIQD
40.0000 meq | ORAL | Status: AC
  Administered 2011-01-08 (×3): 40 meq
  Filled 2011-01-08 (×3): qty 30

## 2011-01-08 MED ORDER — FUROSEMIDE 10 MG/ML IJ SOLN: 40.0000 mg | Freq: Two times a day (BID) | INTRAMUSCULAR | Status: DC

## 2011-01-08 MED ORDER — GUAIFENESIN ER 600 MG PO TB12
600.0000 mg | ORAL_TABLET | Freq: Two times a day (BID) | ORAL | Status: DC
  Administered 2011-01-08: 600 mg via ORAL
  Filled 2011-01-08 (×6): qty 1

## 2011-01-08 MED ORDER — PROPOFOL 10 MG/ML IV EMUL
INTRAVENOUS | Status: AC
  Administered 2011-01-08: 15 mg
  Filled 2011-01-08: qty 50

## 2011-01-08 MED ORDER — AMIODARONE IV BOLUS ONLY 150 MG/100ML
150.0000 mg | Freq: Once | INTRAVENOUS | Status: AC
  Administered 2011-01-08: 100 mL via INTRAVENOUS

## 2011-01-08 MED ORDER — LEVALBUTEROL HCL 0.63 MG/3ML IN NEBU
0.6300 mg | INHALATION_SOLUTION | RESPIRATORY_TRACT | Status: DC | PRN
  Filled 2011-01-08: qty 3

## 2011-01-08 MED ORDER — LEVALBUTEROL HCL 0.63 MG/3ML IN NEBU
0.6300 mg | INHALATION_SOLUTION | RESPIRATORY_TRACT | Status: DC | PRN
  Administered 2011-01-08 – 2011-01-10 (×2): 0.63 mg via RESPIRATORY_TRACT
  Filled 2011-01-08: qty 3

## 2011-01-08 MED ORDER — POTASSIUM CHLORIDE 20 MEQ/15ML (10%) PO LIQD: 40.0000 meq | Freq: Three times a day (TID) | ORAL | Status: DC

## 2011-01-08 MED ORDER — POTASSIUM CHLORIDE 10 MEQ/50ML IV SOLN: 10.0000 meq | INTRAVENOUS | Status: AC

## 2011-01-08 MED ORDER — LEVALBUTEROL HCL 0.63 MG/3ML IN NEBU
0.6300 mg | INHALATION_SOLUTION | Freq: Four times a day (QID) | RESPIRATORY_TRACT | Status: DC
  Filled 2011-01-08 (×2): qty 3

## 2011-01-08 MED ORDER — POTASSIUM CHLORIDE 10 MEQ/100ML IV SOLN: 10.0000 meq | INTRAVENOUS | Status: DC

## 2011-01-08 MED ORDER — LORAZEPAM 2 MG/ML IJ SOLN
0.5000 mg | Freq: Once | INTRAMUSCULAR | Status: AC
  Administered 2011-01-08: 0.5 mg via INTRAVENOUS
  Filled 2011-01-08: qty 1

## 2011-01-08 MED ORDER — PROPOFOL 10 MG/ML IV EMUL
5.0000 ug/kg/min | INTRAVENOUS | Status: DC
  Administered 2011-01-08: 30 ug/kg/min via INTRAVENOUS
  Administered 2011-01-09: 15 ug/kg/min via INTRAVENOUS
  Filled 2011-01-08 (×3): qty 100

## 2011-01-08 MED ORDER — ETOMIDATE 2 MG/ML IV SOLN
INTRAVENOUS | Status: AC
  Administered 2011-01-08: 20 mg
  Filled 2011-01-08: qty 10

## 2011-01-08 MED ORDER — FUROSEMIDE 10 MG/ML IJ SOLN
40.0000 mg | Freq: Two times a day (BID) | INTRAMUSCULAR | Status: DC
Start: 2011-01-08 — End: 2011-01-08

## 2011-01-08 MED ORDER — LEVALBUTEROL HCL 0.63 MG/3ML IN NEBU
0.6300 mg | INHALATION_SOLUTION | Freq: Four times a day (QID) | RESPIRATORY_TRACT | Status: DC
  Administered 2011-01-08 – 2011-01-15 (×30): 0.63 mg via RESPIRATORY_TRACT
  Filled 2011-01-08 (×34): qty 3

## 2011-01-08 MED ORDER — IOHEXOL 300 MG/ML  SOLN
70.0000 mL | Freq: Once | INTRAMUSCULAR | Status: AC | PRN
  Administered 2011-01-08: 70 mL via INTRAVENOUS

## 2011-01-08 MED ORDER — POTASSIUM CHLORIDE 10 MEQ/50ML IV SOLN
10.0000 meq | INTRAVENOUS | Status: DC
  Administered 2011-01-08 (×2): 10 meq via INTRAVENOUS
  Filled 2011-01-08 (×2): qty 50

## 2011-01-08 MED ORDER — FUROSEMIDE 10 MG/ML IJ SOLN
20.0000 mg | Freq: Once | INTRAMUSCULAR | Status: AC
  Administered 2011-01-08: 20 mg via INTRAVENOUS
  Filled 2011-01-08: qty 2

## 2011-01-08 NOTE — Progress Notes (Signed)
eLink Physician-Brief Progress Note Patient Name: [name] DOB: [birth year] MRN: [id number]  Date of Service  01/08/2011   HPI/Events of Note   Patient with resp distress and EKG changes.  Seen by Dr. Mayford Knife.  Concern for PE vs Pulm edema.  eICU Interventions  Plan: CTA to r/o PE Lasix per Dr. Mayford Knife   Intervention Category Intermediate Interventions: Respiratory distress - evaluation and management  DETERDING,ELIZABETH 01/08/2011, 2:30 AM

## 2011-01-08 NOTE — Progress Notes (Signed)
Name: [name] MRN: [id number] DOB: 06/09/1945    LOS: 7  PCCM PROGRESS  NOTE  Patient Profile:  65 y/o F with PMHx of HTN suffered fall on 12/24/10 and LE pain. Initially taken to [facility] and diagnosed with R femoral DVT and started on anticoagulation. Pt had persistent pain and she had further imaging studies which showed R femoral fracture. Pt was transferred to [facility] for further management and was taken to the OR on 01/03/11 and underwent Open reduction and internal fixation of the right femur fracture. Post op suffered episode of Torsades and underwent 7-10 mins CPR. Subsequently developed AFRVR and hypotension. EKG demonstrated ST elevation on the inferior leads. Underwent cardiac cath 12/7. PCCM asked to assist with vent/CCM issues   Lines / Drains: L IJ CVL 12/7 >> ETT 12/7 >> 12/10 ETT 12/12>>>  Cultures: Urine 12/10 >> UA negative Resp 12/10 >> NOS >>  Antibiotics: Vanc 12/10 >> 12/11 Ceftaz (fever, purulent sputum, cloudy urine) 12/10 >>  Tests / Event 12/7: ORIF R femur fx 11/28LE venous dopplers: Occlusive right femoropopliteal DVT 12/8 Cath: Severe 95% thrombotic lesion in the mid right coronary artery which was successfully stented with a bare metal stent, postdilated to greater than 3 mm in diameter. Severe left ventricular dysfunction with inferior apical and distal anterior hypokinesis of severe degree. The estimated ejection fraction is 25%. 12/8 CT head:  No acute abnormalities 12/8 Echo: Systolic function was moderately to severely reduced. The estimated ejection fraction was in the range of 30% to 35%. There is akinesis of the apical myocardium. There is akinesis of the inferoposterior myocardium. There is hypokinesis of the lateral myocardium 12/12: CT chest:No evidence of significant pulmonary embolus. Moderate bilateral pleural effusions with significant consolidation and atelectasis in both lungs. Perihilar infiltration suggesting edema. Indeterminate mass or  fluid collection in the right supraclavicular<BR>region measuring about 3 x 2.2 cm. Consider ultrasound for further evaluation.  SUBJ: Developed acute distress am on 12/12. Required emergent intubation after failing attempts on NIPPV.   OBJ: Vital Signs: Temp:  [96.7 F (35.9 C)-98.3 F (36.8 C)] 96.7 F (35.9 C) (12/12 0805) Pulse Rate:  [66-130] 99  (12/12 0805) Resp:  [16-45] 34  (12/12 0730) BP: (90-176)/(53-120) 131/85 mmHg (12/12 0805) SpO2:  [65 %-100 %] 96 % (12/12 0805) FiO2 (%):  [100 %] 100 % (12/12 0805) Weight:  [52.8 kg (116 lb 6.5 oz)] 116 lb 6.5 oz (52.8 kg) (12/12 0500) I/O last 3 completed shifts: In: 2427.6 [I.V.:2127.6; IV Piggyback:300] Out: 2380 [Urine:2380]  Physical Examination: General:  Ill appearing 65 year old female now on full vent support.  Neuro:  sedated HEENT/NECK:  No JVD,  No TMG , intubated w/ #7 ETT.  Cardiovascular:  Tachy, nl s1/s2 no s3/s4 Lungs:  Diffuse rhonchi, + accessory muscle use Abdomen:  Soft NT BS decreased Musculoskeletal: L wrist swollen Skin:  clear  Ventilator settings: Vent Mode:  [-] PRVC FiO2 (%):  [100 %] 100 % Set Rate:  [16 bmp] 16 bmp Vt Set:  [400 mL] 400 mL PEEP:  [5 cmH20] 5 cmH20 Plateau Pressure:  [20 cmH20] 20 cmH20  Labs and Imaging:   CXR: RLL inf vs atx  Assessment and Plan: Patient is a 65 y/o F with PMHx of HTN, DL who was diagnosed with R femoral DVT, started on anticoagulation and R femoral fracture s/p ORIF of the right femur fracture and on the post op period developed cardioresp arrest s/p CPR with cardiac arrhythmias (torsades, then afib with  RVR and VT). Now s/p reintubation on 12/12.  Acute respiratory failure (12/12). PCXR and CT with bilateral effusions and Bibasilar R>L airspace disease w/ air bronchogram prior to intubation. Developed acute onset of respiratory failure. Suspect multifactorial in setting of bibasilar pneumonia vs atelectasis, deconditioning and possibly acute decompensated  heart failure.  Plan: -full vent support -check CVP and Co-ox, wonder if inotrope support may be of benefit here.  -sedation protocol -cont abx -cont bds -diurese today as bp and bun/cr allow  S/p cardiac arrest due to ischemic Torsade - IWMI, s/p bare metal stent plan -Mgmt per Cards: cont asa and plavix  Hypotension - Suspect mostly cardiogenic, Now off pressors.    A fib with RVR  - sp cardioversion  Plan - ? Convert amio gtt to PO - will leave to Cards to decide - further mgmt per Cards   R low extrem DVT  -cont hep -follow CBC    Anemia: holding  Lab 01/08/11 0400 01/07/11 1542 01/07/11 0411  HGB 8.6* 8.8* 7.5*    Plan -Transfuse for Hgb < 7.0 gm/dL  Hypokalemia   Lab 81/19/14 0400 01/06/11 0504 01/05/11 0500  K 2.7* 3.5 3.2*  plan: -replace and recheck in setting of diuretic initiation.   Low grade fever - but progressive leukocytosis:  Purulent secretions - much improved, UA negative   Lab 01/08/11 0400 01/07/11 1542 01/07/11 0411  WBC 20.8* 10.9* 10.0  Plan: -Cont ceftaz (8 day total course for PNA- NOS) -recheck PCT   Hyperglycemia CBG (last 3)   Basename 01/08/11 0803 01/08/11 0406 01/07/11 2348  GLUCAP 152* 197* 125*  plan: -ssi   -The patient is critically ill with multiple organ systems failure and requires high complexity decision making for assessment and support, frequent evaluation and titration of therapies, application of advanced monitoring technologies and extensive interpretation of multiple databases. Critical Care Time devoted to patient care services described in this note is 30 minutes.  Billy Fischer, MD;  PCCM service; Mobile [phone]  01/08/2011, 8:39 AM

## 2011-01-08 NOTE — Clinical Documentation Improvement (Signed)
ACUITY AND TYPE OF CHF DOCUMENTATION CLARIFICATION QUERY  THIS DOCUMENT IS NOT A PERMANENT PART OF THE MEDICAL RECORD  Please update your documentation within the medical record to reflect your response to this query.                                                                                    01/08/11  Dr. Jens Som and/or Associates,  In a better effort to capture your patient's severity of illness, reflect appropriate length of stay and utilization of resources, a review of the patient medical record has revealed the following indicators regarding the diagnosis of Heart Failure.    Based on your clinical judgment, please document the ACUITY and TYPE of CHF in the progress notes and discharge summary:  ACUITY:  - Acute  - Acute on Chronic  - Chronic  AND   TYPE:  - Systolic  - Diastolic  - Combined  Clinical Information:  Progress Note 01/08/11 Dr. Jens Som "ischemic cardiomyopathy/congestive heart failure-the patient has significant pleural effusions on her CT scan. Begin diuresis with Lasix 40 mg IV twice a day as tolerated. Follow renal function closely. Wean pressors as tolerated."  01/04/11 Echo Left ventricle: The cavity size was normal. Wall thickness was increased in a pattern of mild LVH. There was moderate focal basal hypertrophy of the septum. Systolic function was moderately to severely reduced. The estimated ejection fraction was in the range of 30% to 35%. Regional wall motion abnormalities: There is akinesis of the apical myocardium. There is akinesis of the inferoposterior myocardium. There is hypokinesis of the lateral myocardium. Doppler parameters are consistent with abnormal left ventricular relaxation (grade 1 diastolic dysfunction).  In responding to this query please exercise your independent judgment.  The fact that a query is asked, does not imply that any particular answer is desired or expected.  Reviewed: additional documentation in the medical  record  Thank You,  Jerral Ralph  RN [name] Certified Clinical Documentation Specialist: Cell   [phone]  Health Information Management [place]  TO RESPOND TO THE THIS QUERY, FOLLOW THE INSTRUCTIONS BELOW:  1. If needed, update documentation for the patient's encounter via the notes activity.  2. Access this query again and click edit on the [employer].  3. After updating, or not, click F2 to complete all highlighted (required) fields concerning your review. Select "additional documentation in the medical record" OR "no additional documentation provided".  4. Click Sign note button.  5. The deficiency will fall out of your InBasket *Please let us know if you are not able to compete this workflow by phone or e-mail (listed below).

## 2011-01-08 NOTE — Consult Note (Signed)
Respiratory failure over night. CTA negative for PE. I&S currently.  PE: RLEx with outstanding appearance of wounds, edema remains but continues to improve. No changes otherwise.  Plan: cont OT, PT, NWB RLE with no restrictions in hip or knee motion.  TED hose to right today, thigh high, and ok to add scd's as well.

## 2011-01-08 NOTE — Progress Notes (Signed)
eLink Physician-Brief Progress Note Patient Name: [name] DOB: [birth year] MRN: [id number]  Date of Service  01/08/2011   HPI/Events of Note   hypokalemia  eICU Interventions  Potassium replaced   Intervention Category Intermediate Interventions: Electrolyte abnormality - evaluation and management  Gordie Crumby 01/08/2011, 5:22 AM

## 2011-01-08 NOTE — Progress Notes (Signed)
CRITICAL VALUE ALERT  Critical value received:  K+ 2.7  Date of notification: 01/08/2011  Time of notification:  0520  Critical value read back:yes  Nurse who received alert:  Caprice Kluver  MD notified (1st page):  Dr. Darrick Penna  Time of first page:  5:22 AM  MD notified (2nd page):  Time of second page:  Responding MD:  Dr. Darrick Penna  Time MD responded:  5:22 AM

## 2011-01-08 NOTE — Progress Notes (Signed)
Pt seen and examined and database reviewed. I agree with above findings, assessment and plan  David Simonds, MD;  PCCM service; [place] [phone]  

## 2011-01-08 NOTE — Procedures (Signed)
Name: [place] Throckmorton MRN: [id number] DOB: 1945-02-13   PROCEDURE NOTE   Procedure:  Endotracheal intubation.  Indication:  Acute respiratory failure  Consent:  Consent was implied due to the emergency nature of the procedure.  Anesthesia:  A total of 20 mg of Etomidate was given intravenously.  Procedure summary:  Appropriate equipment was assembled. The patient was identified as [name] and safety timeout was performed. The patient was placed supine, with head in sniffing position. After adequate level of anesthesia was achieved, a #3 GlydeScope blade was inserted into the oropharynx and the vocal cords were visualized. An attempt was made to place a 7.5-French endotracheal tube.  While the tip of the tube could be easily passed through the cords, we had trouble advancing the tube due to possible subglottic stenosis.  The tube was then withdrawn, patient preoxygenated again and 7.0-French endotracheal tube was then inserted without difficulty and visualized going through the vocal cords. The stylette was removed and cuff inflated. Colorimetric change was noted on the CO2 meter. Breath sounds were heard over both lung fields equally. Post procedure chest xray was ordered.  Complications:  No immediate complications were noted.  Hemodynamic parameters and oxygenation remained stable throughout the procedure.    Orlean Bradford, M.D. Pulmonary and Critical Care Medicine [facility] Cell: [phone] Pager: [phone] 01/08/2011, 8:34 AM

## 2011-01-08 NOTE — Progress Notes (Signed)
Name: [name] MRN: [id number] DOB: 1946/01/05    LOS: 7  PCCM PROGRESS  NOTE  Patient Profile:  65 y/o F with PMHx of HTN suffered fall on 12/24/10 and LE pain. Initially taken to [facility] and diagnosed with R femoral DVT and started on anticoagulation. Pt had persistent pain and she had further imaging studies which showed R femoral fracture. Pt was transferred to [facility] for further management and was taken to the OR on 01/03/11 and underwent Open reduction and internal fixation of the right femur fracture. Post op suffered episode of Torsades and underwent 7-10 mins CPR. Subsequently developed AFRVR and hypotension. EKG demonstrated ST elevation on the inferior leads. Underwent cardiac cath 12/7. PCCM asked to assist with vent/CCM issues   Lines / Drains: L IJ CVL 12/7 >> ETT 12/7 >> 12/10, 12/11 (reintubated for sudden onset resp distress) >>  Cultures: Urine 12/10 >> UA negative Resp 12/10 >> NOS >>  Antibiotics: Vanc 12/10 >> 12/11 Ceftaz (fever, purulent sputum, cloudy urine) 12/10 >>  Tests / Event 12/7: ORIF R femur fx 11/28LE venous dopplers: Occlusive right femoropopliteal DVT 12/8 Cath: Severe 95% thrombotic lesion in the mid right coronary artery which was successfully stented with a bare metal stent, postdilated to greater than 3 mm in diameter. Severe left ventricular dysfunction with inferior apical and distal anterior hypokinesis of severe degree. The estimated ejection fraction is 25%. 12/8 CT head:  No acute abnormalities 12/8 Echo: Systolic function was moderately to severely reduced. The estimated ejection fraction was in the range of 30% to 35%. There is akinesis of the apical myocardium. There is akinesis of the inferoposterior myocardium. There is hypokinesis of the lateral myocardium 12/11 CTA chest: No evidence of pulmonary embolus. Moderate bilateral effusions with significant consolidation and atelectasis in both lungs. Perihilar infiltration suggesting edema.  Indeterminate mass or fluid collection in the right supraclavicular region measuring about 3 x 2.2 cm.      SUBJ: Was doing great post extubation and almost ready for transfer out of ICU. Developed sudden onset of resp distress with need for reintubation   OBJ: Vital Signs: Temp:  [96.7 F (35.9 C)-98.3 F (36.8 C)] 98.1 F (36.7 C) (12/12 1202) Pulse Rate:  [29-130] 99  (12/12 1200) Resp:  [16-45] 44  (12/12 1200) BP: (80-176)/(56-120) 109/81 mmHg (12/12 1200) SpO2:  [64 %-100 %] 99 % (12/12 1200) FiO2 (%):  [29.9 %-100 %] 29.9 % (12/12 1200) Weight:  [52.8 kg (116 lb 6.5 oz)] 116 lb 6.5 oz (52.8 kg) (12/12 0500) I/O last 3 completed shifts: In: 2467.6 [I.V.:2167.6; IV Piggyback:300] Out: 2380 [Urine:2380]  Physical Examination: General:  RASS - 2.  Neuro:  MAEs   HEENT/NECK:  No JVD,  No TMG   Cardiovascular:  Tachy, nl s1/s2 no s3/s4 Lungs:  Clear anteriorly Abdomen:  Soft NT BS decreased Musculoskeletal: Trace B ankle edema  Ventilator settings: Vent Mode:  [-] PRVC FiO2 (%):  [29.9 %-100 %] 29.9 % Set Rate:  [16 bmp] 16 bmp Vt Set:  [400 mL] 400 mL PEEP:  [5 cmH20] 5 cmH20 Plateau Pressure:  [19 cmH20-20 cmH20] 19 cmH20  DATA: BMET    Component Value Date/Time   NA 138 01/08/2011 0400   K 2.7* 01/08/2011 0400   CL 102 01/08/2011 0400   CO2 22 01/08/2011 0400   GLUCOSE 174* 01/08/2011 0400   BUN 13 01/08/2011 0400   CREATININE 0.58 01/08/2011 0400   CALCIUM 8.0* 01/08/2011 0400   GFRNONAA >90 01/08/2011 0400  GFRAA >90 01/08/2011 0400    CBC    Component Value Date/Time   WBC 20.8* 01/08/2011 0400   RBC 3.10* 01/08/2011 0400   HGB 8.6* 01/08/2011 0400   HCT 26.2* 01/08/2011 0400   PLT 606* 01/08/2011 0400   MCV 84.5 01/08/2011 0400   MCH 27.7 01/08/2011 0400   MCHC 32.8 01/08/2011 0400   RDW 17.0* 01/08/2011 0400   LYMPHSABS 1.1 01/01/2011 1645   MONOABS 0.8 01/01/2011 1645   EOSABS 0.2 01/01/2011 1645   BASOSABS 0.0 01/01/2011 1645    Pro-BNP 9629   CXR: increase B as dz c/w edema and/or PNA  Assessment and Plan: Patient is a 65 y/o F with PMHx of HTN, DL who was diagnosed with R femoral DVT, started on anticoagulation and R femoral fracture s/p ORIF of the right femur fracture and on the post op period developed cardioresp arrest s/p CPR with cardiac arrhythmias (torsades, then afib with RVR and VT).   1. S/p cardiac arrest due to ischemic Torsade - IWMI, s/p bare metal stent -Mgmt per Cards -Concern for ischemic event 12/11 with pulm edema requiring re-intubation  2. VDRF -  -As above, concern for pulm edema. Might be component of PNA -Vent settings established. Monitor CXR -Daily WUA/SBT as indicated  3. Hypotension -  Suspect mostly cardiogenic -Cont to wean phenylephrine for MAP > 60 mmHg -Hold further diuresis as she is back on pressors and CVP is only 6 mmHg  4. A fib with RVR  - sp cardioversion > holding NSR - further mgmt per Cards  5. R low extrem DVT  -cont full dose hep -follow CBC   6. Anemia   Lab 01/08/11 0400 01/07/11 1542 01/07/11 0411 01/06/11 0504 01/05/11 0500  HCT 26.2* 26.4* 22.9* 31.4* 32.3*  No overt blood loss. No absolute indication for PRBCs but in setting of concern for ongoing ischemia, will have lower threshold to transfuse  7. Hypokalemia  Replete and recheck  8. Low grade fever, possible PNA -  -Cont ceftaz -f/u resp cx results    -The patient is critically ill with multiple organ systems failure and requires high complexity decision making for assessment and support, frequent evaluation and titration of therapies, application of advanced monitoring technologies and extensive interpretation of multiple databases. Critical Care Time devoted to patient care services described in this note is 40 minutes.  Billy Fischer, MD;  PCCM service; Mobile [phone]  01/08/2011, 1:30 PM

## 2011-01-08 NOTE — Progress Notes (Signed)
Called to see patient due to acute respiratory distress and tachycardia.  EKG read out as aflutter with 2:1 block and acute MI but EKG shows sinus tachycardia with no ST elevation.  She was given several breathing treatments and placed on BiPAP.  Her O2 sats currently are 100% and she is hemodynamically stable.  Chest xray showed a worsening infiltrate in right lung. Exam shows faint wheezes and crackles anteriorly.  She was given Lasix 20mg  IV and a stat BNP and DDimer are pending.  Discussed patient with Dr. Darrick Penna in [facility].  I am concerned that she may have had a PE given her acute hypoxia in setting of persistent tachycardia and recent diagnosis of DVT.  Will get a stat chest CT angio to rule out PE.

## 2011-01-08 NOTE — Progress Notes (Signed)
CSW spoke with pt daughter, who is concerned about pt respiratory failure but coping well. Pt daughter requesting placement at SNF in [place] area when pt medically stable. CSW will wait to fax pt out to facilities until pt is medically stable and will continue to follow for d/c planning.  CSW has contacted [facility] to advise pt will not return.   Baxter Flattery, MSW [phone]

## 2011-01-08 NOTE — Progress Notes (Signed)
Pt began to experience increased work of breathing around 2345 01/07/11. Albuterol neb given per RT and CCM notified. Orders received for ABG, PCXR, and scheduled Xopenex. ABG showed PaO2 of 47 and sats in low 80's. Pt then placed on BiPap at 100%. Pt then began to become increasingly tachycardic with a rate reaching 140's. BP elevated to 180/100. Pt began having extra p waves on monitor. EKG obtained and cardiology notified. EKG faxed to [facility] and cardiology. Orders for 150 mg amiodarone bolus received.

## 2011-01-08 NOTE — Progress Notes (Addendum)
@   Subjective:  Patient intubated and sedated   Objective:  Filed Vitals:   01/08/11 0700 01/08/11 0722 01/08/11 0727 01/08/11 0730  BP: 117/103  117/103   Pulse:   112   Temp:      TempSrc:      Resp: 29  28 34  Height:  5' (1.524 m)    Weight:      SpO2:        Intake/Output from previous day:  Intake/Output Summary (Last 24 hours) at 01/08/11 0753 Last data filed at 01/08/11 0704  Gross per 24 hour  Intake 1284.3 ml  Output   1905 ml  Net -620.7 ml    Physical Exam: Physical exam: Skin is warm and dry.  HEENT is normal.  Neck is supple. No thyromegaly.  Chest with diffuse rhonchi and decreased BS at bases Cardiovascular exam is tachycardic and regular rhythm Abdominal exam nontender or distended. No masses palpated. Extremies s/p right leg surgery with 1+ edema Neuro not evalated due to sedation    Lab Results: Basic Metabolic Panel:  Basename 01/08/11 0400 01/06/11 0504  NA 138 139  K 2.7* 3.5  CL 102 105  CO2 22 26  GLUCOSE 174* 138*  BUN 13 15  CREATININE 0.58 0.67  CALCIUM 8.0* 8.6  MG -- --  PHOS -- --   CBC:  Basename 01/08/11 0400 01/07/11 1542  WBC 20.8* 10.9*  NEUTROABS -- --  HGB 8.6* 8.8*  HCT 26.2* 26.4*  MCV 84.5 84.3  PLT 606* 465*   D-Dimer:  Basename 01/08/11 0158  DDIMER 3.85*      Assessment/Plan:  #1 S/P MI complicated by cardiac arrest. Patient is status post bare-metal stent to the right coronary artery. Continue aspirin and Plavix. Add statin later. #2 ischemic cardiomyopathy/congestive heart failure-the patient has significant pleural effusions on her CT scan. Begin diuresis with Lasix 40 mg IV twice a day as tolerated. Follow renal function closely. Wean pressors as tolerated. #3 ventricular and atrial arrhythmias-continue IV amiodarone. Patient presently in sinus rhythm. #4 recent DVT-continue intravenous heparin. Patient is anemic and we will follow her hemoglobin. No PE on CT scan. #5 VDRF-management per  critical care medicine. #6 anemia-follow hemoglobin and transfuse as needed. #7 pneumonia-possible pneumonia on CT scan. In January antibiotics per critical care medicine. #8 status post repair of leg fracture-management per orthopedic surgery. #9 hypokalemia-supplement.  [name] 01/08/2011, 7:53 AM   Acute systolic CHF [name]

## 2011-01-08 NOTE — Progress Notes (Signed)
ANTICOAGULATION CONSULT NOTE - Follow Up Consult  Pharmacy Consult for: Heparin Indication: recent DVT and STEMI  No Known Allergies  Vital Signs: Temp: 96.7 F (35.9 C) (12/12 0805) Temp src: Axillary (12/12 0805) BP: 131/85 mmHg (12/12 0805) Pulse Rate: 101  (12/12 0848)  Labs:  Basename 01/08/11 0400 01/07/11 1542 01/07/11 0411 01/06/11 0504  HGB 8.6* 8.8* -- --  HCT 26.2* 26.4* 22.9* --  PLT 606* 465* 393 --  APTT -- -- -- --  LABPROT -- -- -- 19.7*  INR -- -- -- 1.64*  HEPARINUNFRC 0.36 0.31 0.36 --  CREATININE 0.58 -- -- 0.67  CKTOTAL -- -- -- --  CKMB -- -- -- --  TROPONINI -- -- -- --   Estimated Creatinine Clearance: 49.7 ml/min (by C-G formula based on Cr of 0.58).   Medications:  Heparin @ 950 units/hr  Assessment: 64yof continues on heparin for recent DVT (11/27), s/p IM nailing of right femur (12/7), and STEMI s/p BMS to RCA (12/8). Heparin level of 0.36 is therapeutic. No bleeding noted. H/H low, Plts ok.  Goal of Therapy:  Heparin level 0.3-0.7 units/ml   Plan:  1) Continue heparin at 950 units/hr 2) Follow up AM heparin level 3) Noted plan to continue Ceftaz for 8 days for PNA. Today is day 3/8.  Fredrik Rigger 01/08/2011,9:22 AM

## 2011-01-09 ENCOUNTER — Inpatient Hospital Stay (HOSPITAL_COMMUNITY): Payer: Medicare Other

## 2011-01-09 ENCOUNTER — Encounter (HOSPITAL_COMMUNITY): Payer: Self-pay | Admitting: Cardiology

## 2011-01-09 DIAGNOSIS — J9600 Acute respiratory failure, unspecified whether with hypoxia or hypercapnia: Secondary | ICD-10-CM

## 2011-01-09 DIAGNOSIS — Z9911 Dependence on respirator [ventilator] status: Secondary | ICD-10-CM

## 2011-01-09 DIAGNOSIS — R579 Shock, unspecified: Secondary | ICD-10-CM

## 2011-01-09 DIAGNOSIS — I219 Acute myocardial infarction, unspecified: Secondary | ICD-10-CM

## 2011-01-09 DIAGNOSIS — I469 Cardiac arrest, cause unspecified: Secondary | ICD-10-CM

## 2011-01-09 HISTORY — DX: Acute myocardial infarction, unspecified: I21.9

## 2011-01-09 LAB — BLOOD GAS, ARTERIAL
Acid-base deficit: 1.5 mmol/L (ref 0.0–2.0)
Drawn by: 31101
FIO2: 0.3 %
MECHVT: 400 mL
O2 Saturation: 98.7 %
PEEP: 5 cmH2O
RATE: 16 resp/min
pCO2 arterial: 23.6 mmHg — ABNORMAL LOW (ref 35.0–45.0)

## 2011-01-09 LAB — CULTURE, RESPIRATORY W GRAM STAIN

## 2011-01-09 LAB — BASIC METABOLIC PANEL
CO2: 21 mEq/L (ref 19–32)
Calcium: 8 mg/dL — ABNORMAL LOW (ref 8.4–10.5)
Chloride: 100 mEq/L (ref 96–112)
Creatinine, Ser: 0.54 mg/dL (ref 0.50–1.10)
Glucose, Bld: 112 mg/dL — ABNORMAL HIGH (ref 70–99)

## 2011-01-09 LAB — CBC
HCT: 24.7 % — ABNORMAL LOW (ref 36.0–46.0)
Hemoglobin: 8 g/dL — ABNORMAL LOW (ref 12.0–15.0)
MCH: 27.7 pg (ref 26.0–34.0)
MCV: 85.5 fL (ref 78.0–100.0)
Platelets: 562 10*3/uL — ABNORMAL HIGH (ref 150–400)
RBC: 2.89 MIL/uL — ABNORMAL LOW (ref 3.87–5.11)
WBC: 15.1 10*3/uL — ABNORMAL HIGH (ref 4.0–10.5)

## 2011-01-09 LAB — GLUCOSE, CAPILLARY
Glucose-Capillary: 127 mg/dL — ABNORMAL HIGH (ref 70–99)
Glucose-Capillary: 103 mg/dL — ABNORMAL HIGH (ref 70–99)

## 2011-01-09 LAB — CARDIAC PANEL(CRET KIN+CKTOT+MB+TROPI)
CK, MB: 3.5 ng/mL (ref 0.3–4.0)
Troponin I: 1.77 ng/mL (ref <0.30)

## 2011-01-09 LAB — VITAMIN D 1,25 DIHYDROXY: Vitamin D2 1, 25 (OH)2: <8 pg/mL

## 2011-01-09 MED ORDER — OSMOLITE 1.5 CAL PO LIQD
1000.0000 mL | ORAL | Status: DC
  Administered 2011-01-09: 1000 mL
  Filled 2011-01-09: qty 1000

## 2011-01-09 NOTE — Progress Notes (Signed)
@   Subjective:  Patient intubated and sedated   Objective:  Filed Vitals:   01/09/11 0615 01/09/11 0630 01/09/11 0645 01/09/11 0700  BP: 95/69 96/68 98/69  93/69  Pulse: 76 73 74 74  Temp:      TempSrc:      Resp: 16 18 15 26   Height:      Weight:      SpO2: 100% 100% 100% 100%    Intake/Output from previous day:  Intake/Output Summary (Last 24 hours) at 01/09/11 0711 Last data filed at 01/09/11 0500  Gross per 24 hour  Intake 1342.08 ml  Output   1195 ml  Net 147.08 ml    Physical Exam: Physical exam: Intubated; sedated Skin is warm and dry.  HEENT is normal.  Neck is supple. No thyromegaly.  Chest with mild rhonchi Cardiovascular exam is tachycardic and regular rhythm Abdominal exam nontender or distended. No masses palpated. Extremies s/p right leg surgery with trace edema Neuro not evalated due to sedation    Lab Results: Basic Metabolic Panel:  Basename 01/09/11 0300 01/08/11 0400  NA 138 138  K 3.9 2.7*  CL 100 102  CO2 21 22  GLUCOSE 112* 174*  BUN 12 13  CREATININE 0.54 0.58  CALCIUM 8.0* 8.0*  MG -- --  PHOS -- --   CBC:  Basename 01/09/11 0300 01/08/11 0400  WBC 15.1* 20.8*  NEUTROABS -- --  HGB 8.0* 8.6*  HCT 24.7* 26.2*  MCV 85.5 84.5  PLT 562* 606*   D-Dimer:  Basename 01/08/11 0158  DDIMER 3.85*      Assessment/Plan:  #1 S/P MI complicated by cardiac arrest. Patient is status post bare-metal stent to the right coronary artery. Continue aspirin and Plavix. Add statin later when extubated. #2 ischemic cardiomyopathy-Will add ACEI and beta blocker later when BP allows. Hold lasix as CVP 5-9. Wean pressors as tolerated. #3 ventricular and atrial arrhythmias-continue IV amiodarone. Patient presently in sinus rhythm. Convert to PO when extubated. #4 recent DVT-continue intravenous heparin. Patient is anemic and we will follow her hemoglobin. No PE on CT scan. #5 VDRF-management per critical care medicine. #6 anemia-follow  hemoglobin and transfuse as needed. #7 pneumonia-possible pneumonia on CT scan. In January antibiotics per critical care medicine. #8 status post repair of leg fracture-management per orthopedic surgery. #9 Hypotension - most likely cardiogenic; ? Contribution from sepsis. Wean pressors as tolerated; recheck cardiac markers to see if trending down from previous MI.  [name] 01/09/2011, 7:11 AM   Acute systolic CHF [name]

## 2011-01-09 NOTE — Progress Notes (Signed)
INITIAL ADULT NUTRITION ASSESSMENT Date: 01/09/2011   Time: 12:29 PM  Reason for Assessment: VDRF  ASSESSMENT: Female 65 y.o.  Dx: Femur fracture, right, s/p fall  Hx:  Past Medical History  Diagnosis Date  . Hypertension   . 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 Vitamin B12 deficiency (dietary) anemia 12/27/2010  . Arthritis   . MI (myocardial infarction) 01/09/2011    Related Meds:     . antiseptic oral rinse  15 mL Mouth Rinse q12n4p  . aspirin  325 mg Oral Daily  . calcium gluconate  1 g Intravenous Once  . cefTAZidime (FORTAZ)  IV  1 g Intravenous Q8H  . chlorhexidine  15 mL Mouth Rinse BID  . clopidogrel  75 mg Oral Q breakfast  . cyanocobalamin  1,000 mcg Intramuscular Weekly  . guaiFENesin  600 mg Oral BID  . insulin aspart  0-9 Units Subcutaneous Q4H  . levalbuterol  0.63 mg Nebulization Q6H  . pantoprazole (PROTONIX) IV  40 mg Intravenous QHS  . potassium chloride  10 mEq Intravenous Q1 Hr x 2  . potassium chloride  40 mEq Per Tube Q4H  . potassium chloride        Ht: 5' (152.4 cm)  Wt: 122 lb 2.2 oz (55.4 kg)  Ideal Wt: 45.5 kg  % Ideal Wt: 121%  Usual Wt: unable to obtain % Usual Wt: ---  Body mass index is 23.85 kg/(m^2).  Food/Nutrition Related Hx: no nutrition problems identified per nutrition screen  Labs:  CMP     Component Value Date/Time   NA 138 01/09/2011 0300   K 3.9 01/09/2011 0300   CL 100 01/09/2011 0300   CO2 21 01/09/2011 0300   GLUCOSE 112* 01/09/2011 0300   BUN 12 01/09/2011 0300   CREATININE 0.54 01/09/2011 0300   CALCIUM 8.0* 01/09/2011 0300   PROT 5.7* 01/04/2011 0500   ALBUMIN 2.0* 01/04/2011 0500   AST 251* 01/04/2011 0500   ALT 65* 01/04/2011 0500   ALKPHOS 150* 01/04/2011 0500   BILITOT 1.0 01/04/2011 0500   GFRNONAA >90 01/09/2011 0300   GFRAA >90 01/09/2011 0300    I/O last 3 completed shifts: In: 2188.9 [I.V.:1878.9; NG/GT:60; IV Piggyback:250] Out: 2745 [Urine:2365; Emesis/NG  output:380] Total I/O In: 793.1 [I.V.:763.1; NG/GT:30] Out: 150 [Urine:150]  Diet Order: NPO  Supplements/Tube Feeding: N/A  IVF:    sodium chloride Last Rate: 20 mL/hr at 01/07/11 1617  amiodarone (CORDARONE) infusion Last Rate: 30 mg/hr (01/08/11 1900)  heparin Last Rate: 950 Units/hr (01/08/11 1318)  phenylephrine (NEO-SYNEPHRINE) Adult infusion Last Rate: 65 mcg/min (01/09/11 1200)  propofol Last Rate: 15 mcg/kg/min (01/09/11 1200)    Estimated Nutritional Needs:   Kcal:1,100-1,200 Protein: 65-75 gm Fluid: >1.5 L  RD unable to obtain nutrition hx at this time; noted pt extubated 12/10, re-intubated today 12/13; no nutrition problems identified PTA; OGT in place; Propofol infusion currently at 4.8 ml/hr providing 127 fat kcals; would initiate EN in next 24-48 hours if prolonged intubation period expected -- regimen recommendations below  NUTRITION DIAGNOSIS: -Inadequate oral intake (NI-2.1).  Status: Ongoing  RELATED TO: inability to eat  AS EVIDENCE BY: NPO status  MONITORING/EVALUATION(Goals): Goal: Initiate EN in next 24-48 hours if prolonged intubation expected/meet >90% of estimated nutrition needs to preserve lean body mass Monitor: EN initiation, weight, labs, I/O's  EDUCATION NEEDS: -No education needs identified at this time  INTERVENTION:  If EN started, recommend Promote formula -- initiate at 20 ml/hr, advance 10 ml every 4 hours  until goal rate of 45 ml/hr reached to provide 1,080 kcals, 67 gm protein, 904 ml of free water  RD to follow for nutrition care plan   Dietitian #: [phone]  DOCUMENTATION CODES Per approved criteria  -Not Applicable    Alger Memos 01/09/2011, 12:29 PM

## 2011-01-09 NOTE — Progress Notes (Signed)
ANTICOAGULATION CONSULT NOTE - Follow Up Consult  Pharmacy Consult for  Heparin Indication:  recent DVT and STEMI  No Known Allergies  Patient Measurements: Height: 5' (152.4 cm) Weight: 122 lb 2.2 oz (55.4 kg) IBW/kg (Calculated) : 45.5    Vital Signs: Temp: 97.4 F (36.3 C) (12/13 1258) Temp src: Axillary (12/13 1258) BP: 103/67 mmHg (12/13 1300) Pulse Rate: 76  (12/13 1300)  Labs:  Basename 01/09/11 0750 01/09/11 0300 01/08/11 0400 01/07/11 1542  HGB -- 8.0* 8.6* --  HCT -- 24.7* 26.2* 26.4*  PLT -- 562* 606* 465*  APTT -- -- -- --  LABPROT -- -- -- --  INR -- -- -- --  HEPARINUNFRC -- 0.40 0.36 0.31  CREATININE -- 0.54 0.58 --  CKTOTAL 113 -- -- --  CKMB 3.5 -- -- --  TROPONINI 1.77* -- -- --   Estimated Creatinine Clearance: 53.3 ml/min (by C-G formula based on Cr of 0.54).  Assessment:   Heparin level remains therapeutic on 950 units/hr drip.    H/H trending down.  PLTC  562K.  Goal of Therapy:  Heparin level 0.3-0.7 units/ml   Plan:     Continue Heparin at 950 units/hr.    Continue daily heparin level and CBC.  Dennie Fetters Pager: [phone] 01/09/2011,3:01 PM

## 2011-01-09 NOTE — Progress Notes (Signed)
Name: [name] MRN: [id number] DOB: [birth year]    LOS: 8  PCCM PROGRESS  NOTE  Patient Profile:  65 y/o F with PMHx of HTN suffered fall on 12/24/10 and LE pain. Initially taken to [facility] and diagnosed with R femoral DVT and started on anticoagulation. Pt had persistent pain and she had further imaging studies which showed R femoral fracture. Pt was transferred to [facility] for further management and was taken to the OR on 01/03/11 and underwent Open reduction and internal fixation of the right femur fracture. Post op suffered episode of Torsades and underwent 7-10 mins CPR. Subsequently developed AFRVR and hypotension. EKG demonstrated ST elevation on the inferior leads. Underwent cardiac cath 12/7. PCCM asked to assist with vent/CCM issues   Lines / Drains: L IJ CVL 12/7 >> ETT  12/7 >> 12/10  12/11 (reintubated for sudden onset resp distress) >>  Cultures: Urine 12/10 >> UA negative Resp 12/10 >> NOF  Antibiotics: Vanc 12/10 >> 12/11 Ceftaz (fever, purulent sputum, cloudy urine) 12/10 >>  Tests / Event 12/7: ORIF R femur fx 11/28LE venous dopplers: Occlusive right femoropopliteal DVT 12/8 Cath: Severe 95% thrombotic lesion in the mid right coronary artery which was successfully stented with a bare metal stent, postdilated to greater than 3 mm in diameter. Severe left ventricular dysfunction with inferior apical and distal anterior hypokinesis of severe degree. The estimated ejection fraction is 25%. 12/8 CT head:  No acute abnormalities 12/8 Echo: Systolic function was moderately to severely reduced. The estimated ejection fraction was in the range of 30% to 35%. There is akinesis of the apical myocardium. There is akinesis of the inferoposterior myocardium. There is hypokinesis of the lateral myocardium 12/11 Reintubated for sudden onset resp distress 12/11 CTA chest: No evidence of pulmonary embolus. Moderate bilateral effusions with significant consolidation and atelectasis in both  lungs. Perihilar infiltration suggesting edema. Indeterminate mass or fluid collection in the right supraclavicular region measuring about 3 x 2.2 cm.      ---------------------------------------------------------------------------------------------------  SUBJ: Failed SBT this AM. RASS +1. + F/C  OBJ: Vital Signs: Temp:  [97.4 F (36.3 C)-102.9 F (39.4 C)] 97.4 F (36.3 C) (12/13 1258) Pulse Rate:  [72-111] 76  (12/13 1300) Resp:  [3-45] 25  (12/13 1300) BP: (71-128)/(53-103) 103/67 mmHg (12/13 1300) SpO2:  [96 %-100 %] 96 % (12/13 1328) FiO2 (%):  [29.8 %-30.3 %] 29.9 % (12/13 1300) Weight:  [55.4 kg (122 lb 2.2 oz)] 122 lb 2.2 oz (55.4 kg) (12/13 0400) I/O last 3 completed shifts: In: 2188.9 [I.V.:1878.9; NG/GT:60; IV Piggyback:250] Out: 2745 [Urine:2365; Emesis/NG output:380]  Physical Examination: General:  RASS +1.  Neuro:  MAEs , + F/C HEENT/NECK:  No JVD,  No TMG   Cardiovascular:  Tachy, nl s1/s2 no s3/s4 Lungs:  R>L rhonchi Abdomen:  Soft NT BS decreased Musculoskeletal: Trace B ankle edema  Ventilator settings: Vent Mode:  [-] PRVC FiO2 (%):  [29.8 %-30.3 %] 29.9 % Set Rate:  [16 bmp] 16 bmp Vt Set:  [40 mL-400 mL] 400 mL PEEP:  [5 cmH20] 5 cmH20 Plateau Pressure:  [17 cmH20-23 cmH20] 18 cmH20  DATA: BMET    Component Value Date/Time   NA 138 01/09/2011 0300   K 3.9 01/09/2011 0300   CL 100 01/09/2011 0300   CO2 21 01/09/2011 0300   GLUCOSE 112* 01/09/2011 0300   BUN 12 01/09/2011 0300   CREATININE 0.54 01/09/2011 0300   CALCIUM 8.0* 01/09/2011 0300   GFRNONAA >90 01/09/2011 0300   GFRAA >  90 01/09/2011 0300    CBC    Component Value Date/Time   WBC 15.1* 01/09/2011 0300   RBC 2.89* 01/09/2011 0300   HGB 8.0* 01/09/2011 0300   HCT 24.7* 01/09/2011 0300   PLT 562* 01/09/2011 0300   MCV 85.5 01/09/2011 0300   MCH 27.7 01/09/2011 0300   MCHC 32.4 01/09/2011 0300   RDW 17.7* 01/09/2011 0300   LYMPHSABS 1.1 01/01/2011 1645   MONOABS 0.8  01/01/2011 1645   EOSABS 0.2 01/01/2011 1645   BASOSABS 0.0 01/01/2011 1645   Pro-BNP 9629   CXR: improved aeration RLL. RLL AS dz persists  Assessment and Plan: Patient is a 65 y/o F with PMHx of HTN, DL who was diagnosed with R femoral DVT, started on anticoagulation and R femoral fracture s/p ORIF of the right femur fracture and on the post op period developed cardioresp arrest s/p CPR with cardiac arrhythmias (torsades, then afib with RVR and VT).   1. S/p cardiac arrest due to ischemic Torsade - IWMI, s/p bare metal stent -Mgmt per Cards -Concern for ischemic event 12/11 with pulm edema requiring re-intubation  2. VDRF -  -Now suspect PNA is the predominant residual issue -Cont full support -Cont daily WUA/SBT as indicated  3. Hypotension -  Suspect mostly cardiogenic -Cont to wean phenylephrine for MAP > 60 mmHg -Hold further diuresis as she is back on pressors and CVP is only 6 mmHg  4. A fib with RVR  - sp cardioversion > holding NSR - further mgmt per Cards  5. R low extrem DVT  -cont full dose hep -follow CBC   6. Anemia   Lab 01/09/11 0300 01/08/11 0400 01/07/11 1542 01/07/11 0411 01/06/11 0504  HCT 24.7* 26.2* 26.4* 22.9* 31.4*  No overt blood loss. No absolute indication for PRBCs but in setting of possible ongoing ischemia, will have lower threshold to transfuse  7. Hypokalemia  Resolved  8. Fever, PNA -  -Cont ceftaz -Reculture and add Vanc if fever recurs    -The patient is critically ill with multiple organ systems failure and requires high complexity decision making for assessment and support, frequent evaluation and titration of therapies, application of advanced monitoring technologies and extensive interpretation of multiple databases. Critical Care Time devoted to patient care services described in this note is 30 minutes.  Billy Fischer, MD;  PCCM service; Mobile [phone]  01/09/2011, 2:28 PM

## 2011-01-10 LAB — CBC
HCT: 25.7 % — ABNORMAL LOW (ref 36.0–46.0)
Hemoglobin: 8.2 g/dL — ABNORMAL LOW (ref 12.0–15.0)
MCH: 27.8 pg (ref 26.0–34.0)
MCHC: 31.9 g/dL (ref 30.0–36.0)
MCV: 87.1 fL (ref 78.0–100.0)
Platelets: 479 K/uL — ABNORMAL HIGH (ref 150–400)
RBC: 2.95 MIL/uL — ABNORMAL LOW (ref 3.87–5.11)
RDW: 18.4 % — ABNORMAL HIGH (ref 11.5–15.5)
WBC: 14 K/uL — ABNORMAL HIGH (ref 4.0–10.5)

## 2011-01-10 LAB — HEPARIN LEVEL (UNFRACTIONATED): Heparin Unfractionated: 0.27 [IU]/mL — ABNORMAL LOW (ref 0.30–0.70)

## 2011-01-10 LAB — BASIC METABOLIC PANEL
BUN: 11 mg/dL (ref 6–23)
CO2: 22 mEq/L (ref 19–32)
Calcium: 7.8 mg/dL — ABNORMAL LOW (ref 8.4–10.5)
Creatinine, Ser: 0.63 mg/dL (ref 0.50–1.10)
Glucose, Bld: 129 mg/dL — ABNORMAL HIGH (ref 70–99)

## 2011-01-10 LAB — GLUCOSE, CAPILLARY
Glucose-Capillary: 106 mg/dL — ABNORMAL HIGH (ref 70–99)
Glucose-Capillary: 119 mg/dL — ABNORMAL HIGH (ref 70–99)
Glucose-Capillary: 126 mg/dL — ABNORMAL HIGH (ref 70–99)
Glucose-Capillary: 142 mg/dL — ABNORMAL HIGH (ref 70–99)
Glucose-Capillary: 142 mg/dL — ABNORMAL HIGH (ref 70–99)

## 2011-01-10 MED ORDER — FENTANYL CITRATE 0.05 MG/ML IJ SOLN
INTRAMUSCULAR | Status: AC
  Filled 2011-01-10: qty 2

## 2011-01-10 MED ORDER — ETOMIDATE 2 MG/ML IV SOLN
INTRAVENOUS | Status: AC
  Filled 2011-01-10: qty 10

## 2011-01-10 MED ORDER — POTASSIUM CHLORIDE 10 MEQ/50ML IV SOLN
10.0000 meq | INTRAVENOUS | Status: AC
  Administered 2011-01-10 (×4): 10 meq via INTRAVENOUS
  Filled 2011-01-10 (×4): qty 50

## 2011-01-10 MED ORDER — FUROSEMIDE 10 MG/ML IJ SOLN
40.0000 mg | Freq: Once | INTRAMUSCULAR | Status: AC
  Administered 2011-01-10: 40 mg via INTRAVENOUS
  Filled 2011-01-10: qty 4

## 2011-01-10 MED ORDER — SODIUM CHLORIDE 0.9 % IJ SOLN
10.0000 mL | Freq: Two times a day (BID) | INTRAMUSCULAR | Status: DC
  Administered 2011-01-10 – 2011-01-15 (×10): 10 mL
  Filled 2011-01-10: qty 10

## 2011-01-10 MED ORDER — FENTANYL CITRATE 0.05 MG/ML IJ SOLN
12.5000 ug | INTRAMUSCULAR | Status: DC | PRN
  Administered 2011-01-10 (×2): 25 ug via INTRAVENOUS
  Filled 2011-01-10 (×2): qty 2

## 2011-01-10 MED ORDER — SODIUM CHLORIDE 0.9 % IJ SOLN
10.0000 mL | INTRAMUSCULAR | Status: DC | PRN
  Filled 2011-01-10: qty 10

## 2011-01-10 MED ORDER — VECURONIUM BROMIDE 10 MG IV SOLR
INTRAVENOUS | Status: AC
  Filled 2011-01-10: qty 10

## 2011-01-10 NOTE — Progress Notes (Cosign Needed)
Subjective: 6 Days Post-Op Procedure(s) (LRB): LEFT HEART CATHETERIZATION WITH CORONARY ANGIOGRAM (N/A)  Intubated, awake.  Reports no pain in R leg  Objective: Current Vitals Blood pressure 103/60, pulse 79, temperature 101.7 F (38.7 C), temperature source Axillary, resp. rate 29, height 5' (1.524 m), weight 55.4 kg (122 lb 2.2 oz), SpO2 100.00%. Vital signs in last 24 hours: Temp:  [97.4 F (36.3 C)-101.9 F (38.8 C)] 101.7 F (38.7 C) (12/14 0801) Pulse Rate:  [68-95] 79  (12/14 0900) Resp:  [16-30] 29  (12/14 0900) BP: (89-128)/(54-92) 103/60 mmHg (12/14 0900) SpO2:  [85 %-100 %] 100 % (12/14 0900) FiO2 (%):  [29.7 %-30.3 %] 29.9 % (12/14 0900)  Intake/Output from previous day: 12/13 0701 - 12/14 0700 In: 2435.6 [I.V.:2045.6; NG/GT:240; IV Piggyback:150] Out: 825 [Urine:825]  LABS  Basename 01/10/11 0442 01/09/11 0300 01/08/11 0400 01/07/11 1542  HGB 8.2* 8.0* 8.6* 8.8*    Basename 01/10/11 0442 01/09/11 0300  WBC 14.0* 15.1*  RBC 2.95* 2.89*  HCT 25.7* 24.7*  PLT 479* 562*    Basename 01/10/11 0442 01/09/11 0300  NA 138 138  K 3.3* 3.9  CL 106 100  CO2 22 21  BUN 11 12  CREATININE 0.63 0.54  GLUCOSE 129* 112*  CALCIUM 7.8* 8.0*   No results found for this basename: LABPT:2,INR:2 in the last 72 hours    Physical Exam  Gen: intubated Ext: incisions look great  + swelling  TEDs in place  Ext is warm  + DP pulse  Active motion R leg noted  Pt resting with ankles in significant equinus but flexible     Imaging Dg Chest Port 1 View  01/09/2011  *RADIOLOGY REPORT*  Clinical Data: Respiratory failure.  Intubated patient.  PORTABLE CHEST - 1 VIEW  Comparison: Portable chest and CT chest 01/08/2011  Findings: Right effusion and airspace disease have improved. Smaller left effusion and basilar airspace disease do not appear changed.  There is cardiomegaly.  No pneumothorax.  IMPRESSION: Improved right effusion and basilar airspace disease.  No change  in smaller left effusion and basilar airspace opacity.  Original Report Authenticated By: Bernadene Bell. Maricela Curet, M.D.    Assessment/Plan: 6 Days Post-Op Procedure(s) (LRB): LEFT HEART CATHETERIZATION WITH CORONARY ANGIOGRAM (N/A)  1. Subacute R distal femur fx s/p IMN  TDWB when cleared for therapies  ROM as tolerated  DSG changes PRN  TED hose  Try to limit pillows under knee 2. R LEx DVT 3. MI with cardioresp failure  Re-intubated after resp distress 2 days ago 4. Continue per CCM    Mearl Latin, PA-C 01/10/2011, 9:48 AM

## 2011-01-10 NOTE — Plan of Care (Signed)
Problem: Phase I Progression Outcomes Goal: HOB elevated 30 degrees c Goal: VAP prevention protocol initiated c Goal: Sedation Protocol initiated if indicated c  Problem: Phase II Progression Outcomes Goal: Date pt extubated/weaned off vent Outcome: Completed/Met Date Met:  01/10/11 01/10/2011  Goal: Time pt extubated/weaned off vent Outcome: Completed/Met Date Met:  01/10/11 1030    Goal: Progress activities as ordered Outcome: Progressing Patient tolerated being out of bed to chair for 1 hour; became tachypneic and needed to be placed on BiPAP upon return to bed

## 2011-01-10 NOTE — Progress Notes (Signed)
ANTICOAGULATION CONSULT NOTE - Follow Up Consult  Pharmacy Consult for Heparin Indication: recent DVT (11/27) and STEMI  No Known Allergies  Patient Measurements: Height: 5' (152.4 cm) Weight: 122 lb 2.2 oz (55.4 kg) IBW/kg (Calculated) : 45.5   Vital Signs: Temp: 99.5 F (37.5 C) (12/14 1155) Temp src: Oral (12/14 1155) BP: 134/58 mmHg (12/14 1100) Pulse Rate: 91  (12/14 1100)  Labs:  Basename 01/10/11 0442 01/09/11 0750 01/09/11 0300 01/08/11 0400  HGB 8.2* -- 8.0* --  HCT 25.7* -- 24.7* 26.2*  PLT 479* -- 562* 606*  APTT -- -- -- --  LABPROT -- -- -- --  INR -- -- -- --  HEPARINUNFRC 0.27* -- 0.40 0.36  CREATININE 0.63 -- 0.54 0.58  CKTOTAL -- 113 -- --  CKMB -- 3.5 -- --  TROPONINI -- 1.77* -- --   Estimated Creatinine Clearance: 53.3 ml/min (by C-G formula based on Cr of 0.63).  Assessment:    Heparin level of 0.27 is slightly subtherapeutic on 950 units/hr.    H/H low stable. PLTC 479K.  Goal of Therapy:  Heparin level 0.3-0.7 units/ml   Plan:    Will increase heparin from 950 to 1050 units/hr to try to get level back into target range.  Continue daily level & CBC.  Dennie Fetters Pager: [phone] 01/10/2011,12:02 PM

## 2011-01-10 NOTE — Progress Notes (Signed)
CSW discussed pt with NCM and note pt is critically ill, MDs considering end of life discussion possibly. CSW will continue to follow and available to provide support to pt daughter. Please contact me if needed.   Baxter Flattery, MSW [phone]

## 2011-01-10 NOTE — Progress Notes (Signed)
Name: [name] MRN: [id number] DOB: July 09, 1945    LOS: 9  PCCM PROGRESS  NOTE  Patient Profile:  65 y/o F with PMHx of HTN suffered fall on 12/24/10 and LE pain. Initially taken to [facility] and diagnosed with R femoral DVT and started on anticoagulation. Pt had persistent pain and she had further imaging studies which showed R femoral fracture. Pt was transferred to [facility] for further management and was taken to the OR on 01/03/11 and underwent Open reduction and internal fixation of the right femur fracture. Post op suffered episode of Torsades and underwent 7-10 mins CPR. Subsequently developed AFRVR and hypotension. EKG demonstrated ST elevation on the inferior leads. Underwent cardiac cath 12/7. PCCM asked to assist with vent/CCM issues   Lines / Drains: L IJ CVL 12/7 >> 12/14 (planned D/C after PICC) ETT  12/7 >> 12/10, 12/11 >> 12/14 PICC 12/14 (planned) >>   Cultures: Urine 12/10 >> UA negative Resp 12/10 >> NOF  Antibiotics: PCT 12/12 >> 2.79,  12/15 >>  Vanc 12/10 >> 12/11 Ceftaz (fever, purulent sputum, cloudy urine) 12/10 >>  Tests / Event 12/7: ORIF R femur fx 11/28LE venous dopplers: Occlusive right femoropopliteal DVT 12/8 Cath: Severe 95% thrombotic lesion in the mid right coronary artery which was successfully stented with a bare metal stent, postdilated to greater than 3 mm in diameter. Severe left ventricular dysfunction with inferior apical and distal anterior hypokinesis of severe degree. The estimated ejection fraction is 25%. 12/8 CT head:  No acute abnormalities 12/8 Echo: Systolic function was moderately to severely reduced. The estimated ejection fraction was in the range of 30% to 35%. There is akinesis of the apical myocardium. There is akinesis of the inferoposterior myocardium. There is hypokinesis of the lateral myocardium 12/11 Reintubated for sudden onset resp distress 12/11 CTA chest: No evidence of pulmonary embolus. Moderate bilateral effusions with  significant consolidation and atelectasis in both lungs. Perihilar infiltration suggesting edema. Indeterminate mass or fluid collection in the right supraclavicular region measuring about 3 x 2.2 cm.      ---------------------------------------------------------------------------------------------------  SUBJ: Has passed SBT. RASS 0. + F/C  OBJ: Vital Signs: Temp:  [97.4 F (36.3 C)-101.9 F (38.8 C)] 101.7 F (38.7 C) (12/14 0801) Pulse Rate:  [68-92] 79  (12/14 0900) Resp:  [16-30] 29  (12/14 0900) BP: (89-115)/(54-92) 103/60 mmHg (12/14 0900) SpO2:  [85 %-100 %] 100 % (12/14 0900) FiO2 (%):  [29.7 %-30.3 %] 29.9 % (12/14 0900) I/O last 3 completed shifts: In: 3162.4 [I.V.:2772.4; NG/GT:240; IV Piggyback:150] Out: 1590 [Urine:1310; Emesis/NG output:280]  Physical Examination: General: NAD.  Neuro:  MAEs , + F/C, RASS 0 HEENT/NECK:  No JVD,  No TMG   Cardiovascular:  Tachy, nl s1/s2 no s3/s4 Lungs:  Minimal rhonchi Abdomen:  Soft NT BS decreased Musculoskeletal: Trace B ankle edema  Ventilator settings: Vent Mode:  [-] CPAP;PSV FiO2 (%):  [29.7 %-30.3 %] 29.9 % Set Rate:  [16 bmp] 16 bmp Vt Set:  [400 mL] 400 mL PEEP:  [5 cmH20] 5 cmH20 Pressure Support:  [10 cmH20] 10 cmH20 Plateau Pressure:  [14 cmH20-18 cmH20] 14 cmH20  DATA: BMET    Component Value Date/Time   NA 138 01/10/2011 0442   K 3.3* 01/10/2011 0442   CL 106 01/10/2011 0442   CO2 22 01/10/2011 0442   GLUCOSE 129* 01/10/2011 0442   BUN 11 01/10/2011 0442   CREATININE 0.63 01/10/2011 0442   CALCIUM 7.8* 01/10/2011 0442   GFRNONAA >90 01/10/2011 8657  GFRAA >90 01/10/2011 0442    CBC    Component Value Date/Time   WBC 14.0* 01/10/2011 0442   RBC 2.95* 01/10/2011 0442   HGB 8.2* 01/10/2011 0442   HCT 25.7* 01/10/2011 0442   PLT 479* 01/10/2011 0442   MCV 87.1 01/10/2011 0442   MCH 27.8 01/10/2011 0442   MCHC 31.9 01/10/2011 0442   RDW 18.4* 01/10/2011 0442   LYMPHSABS 1.1 01/01/2011 1645     MONOABS 0.8 01/01/2011 1645   EOSABS 0.2 01/01/2011 1645   BASOSABS 0.0 01/01/2011 1645     CXR: no new CXR  Assessment and Plan: Patient is a 65 y/o F with PMHx of HTN, DL who was diagnosed with R femoral DVT, started on anticoagulation and R femoral fracture s/p ORIF of the right femur fracture and on the post op period developed cardioresp arrest s/p CPR with cardiac arrhythmias (torsades, then afib with RVR and VT).   1. S/p cardiac arrest due to ischemic Torsade - IWMI, s/p bare metal stent -Mgmt per Cards -Concern for ischemic event 12/11 with pulm edema requiring re-intubation  2. VDRF -  -Now suspect PNA is the predominant residual issue -Passed SBT again. Agitation much improved. Extubate again 12/14  3. Hypotension -  Suspect mostly cardiogenic -Cont to wean phenylephrine for MAP > 60 mmHg -Hold further diuresis  4. A fib with RVR  - sp cardioversion > holding NSR on amio gtt - further mgmt per Cards  5. R low extrem DVT  -cont full dose hep -follow CBC   6. Anemia   Lab 01/10/11 0442 01/09/11 0300 01/08/11 0400 01/07/11 1542 01/07/11 0411  HCT 25.7* 24.7* 26.2* 26.4* 22.9*  No overt blood loss. No absolute indication for PRBCs but in setting of possible ongoing ischemia, will have lower threshold to transfuse  7. Hypokalemia  -Replete and follow  8. Fever, PNA -  -Cont ceftaz for now -Recheck CXR and PCT AM 12/15 > consider D/C of abx if PCT has normalized -Reculture and add Vanc if fever recurs    -The patient is critically ill with multiple organ systems failure and requires high complexity decision making for assessment and support, frequent evaluation and titration of therapies, application of advanced monitoring technologies and extensive interpretation of multiple databases. Critical Care Time devoted to patient care services described in this note is 30 minutes.  Billy Fischer, MD;  PCCM service; Mobile [phone]  01/10/2011, 10:20 AM

## 2011-01-10 NOTE — Progress Notes (Signed)
@   Subjective:  Patient intubated; alert; denies chest pain.   Objective:  Filed Vitals:   01/10/11 0405 01/10/11 0500 01/10/11 0600 01/10/11 0700  BP:  106/64 98/57 98/60   Pulse: 86 86 78 85  Temp:      TempSrc:      Resp: 20 19 21 21   Height:      Weight:      SpO2: 100% 100% 100% 100%    Intake/Output from previous day:  Intake/Output Summary (Last 24 hours) at 01/10/11 0740 Last data filed at 01/10/11 0700  Gross per 24 hour  Intake 2420.59 ml  Output    825 ml  Net 1595.59 ml    Physical Exam: Physical exam: Intubated Skin is warm and dry.  HEENT is normal.  Neck is supple. No thyromegaly.  Chest CTA Cardiovascular exam is tachycardic and regular rhythm Abdominal exam nontender or distended. No masses palpated. Extremies s/p right leg surgery with trace edema Neuro moves all extremities     Lab Results: Basic Metabolic Panel:  Basename 01/10/11 0442 01/09/11 0300  NA 138 138  K 3.3* 3.9  CL 106 100  CO2 22 21  GLUCOSE 129* 112*  BUN 11 12  CREATININE 0.63 0.54  CALCIUM 7.8* 8.0*  MG -- --  PHOS -- --   CBC:  Basename 01/10/11 0442 01/09/11 0300  WBC 14.0* 15.1*  NEUTROABS -- --  HGB 8.2* 8.0*  HCT 25.7* 24.7*  MCV 87.1 85.5  PLT 479* 562*   D-Dimer:  Basename 01/08/11 0158  DDIMER 3.85*      Assessment/Plan:  #1 S/P MI complicated by cardiac arrest. Patient is status post bare-metal stent to the right coronary artery. Continue aspirin and Plavix. Add statin later when extubated. #2 ischemic cardiomyopathy-Will add ACEI and beta blocker later when BP allows. Continue to hold lasix for now. Wean pressors as tolerated. #3 ventricular and atrial arrhythmias-continue IV amiodarone; convert to po after extubation. Patient presently in sinus rhythm.  #4 recent DVT-continue intravenous heparin. No PE on CT scan. #5 VDRF-management per critical care medicine. #6 anemia-follow hemoglobin and transfuse as needed. #7 pneumonia-Antibiotics per  critical care medicine. #8 status post repair of leg fracture-management per orthopedic surgery. #9 Hypotension - ? Contribution from sepsis. Wean pressors as tolerated; cardiac markers trending down from previous MI; doubt recurrent ischemic event.  Olga Millers 01/10/2011, 7:40 AM   Acute systolic CHF Olga Millers

## 2011-01-10 NOTE — Procedures (Signed)
Extubation Procedure Note  Patient Details:   Name: [name] DOB: [birth year] MRN: [id number]   Airway Documentation:   Pt extubated per MD order, placed on 3 L [place], sats 100%. Positive cuff leak but slight stridor after extubation. MD aware and says to just watch her. HR 93,RR 23. BBS rhonchus. Pt able to vocalize.  Evaluation  O2 sats: stable throughout and currently acceptable Complications: No apparent complications Patient  tolerate procedure well. Bilateral Breath Sounds: Rhonchi Suctioning: Airway Yes  Arloa Koh 01/10/2011, 10:28 AM

## 2011-01-10 NOTE — Progress Notes (Signed)
Pt with labored respirations using accessory muscles audible coarse stridor . Dr Sung Amabile called and informed Bipap applied per RT and xopenex prn tx given and 40 mg IV Lasix given per orders.

## 2011-01-11 ENCOUNTER — Inpatient Hospital Stay (HOSPITAL_COMMUNITY): Payer: Medicare Other

## 2011-01-11 LAB — CBC
HCT: 25 % — ABNORMAL LOW (ref 36.0–46.0)
MCV: 86.8 fL (ref 78.0–100.0)
RDW: 18.4 % — ABNORMAL HIGH (ref 11.5–15.5)
WBC: 14.2 10*3/uL — ABNORMAL HIGH (ref 4.0–10.5)

## 2011-01-11 LAB — BASIC METABOLIC PANEL
BUN: 10 mg/dL (ref 6–23)
CO2: 22 mEq/L (ref 19–32)
Chloride: 104 mEq/L (ref 96–112)
Creatinine, Ser: 0.55 mg/dL (ref 0.50–1.10)
GFR calc Af Amer: >90 mL/min (ref >90)
Glucose, Bld: 151 mg/dL — ABNORMAL HIGH (ref 70–99)

## 2011-01-11 MED ORDER — AMIODARONE HCL 200 MG PO TABS
400.0000 mg | ORAL_TABLET | Freq: Two times a day (BID) | ORAL | Status: DC
  Administered 2011-01-11 – 2011-01-12 (×4): 400 mg via ORAL
  Filled 2011-01-11 (×6): qty 2

## 2011-01-11 MED ORDER — POTASSIUM CHLORIDE 10 MEQ/50ML IV SOLN
INTRAVENOUS | Status: AC
  Administered 2011-01-11: 10 meq via INTRAVENOUS
  Filled 2011-01-11: qty 200

## 2011-01-11 MED ORDER — FUROSEMIDE 10 MG/ML IJ SOLN
40.0000 mg | Freq: Two times a day (BID) | INTRAMUSCULAR | Status: DC
  Administered 2011-01-11 (×2): 40 mg via INTRAVENOUS
  Filled 2011-01-11 (×3): qty 4

## 2011-01-11 MED ORDER — METOPROLOL TARTRATE 12.5 MG HALF TABLET
12.5000 mg | ORAL_TABLET | Freq: Two times a day (BID) | ORAL | Status: DC
  Administered 2011-01-11 – 2011-01-16 (×11): 12.5 mg via ORAL
  Filled 2011-01-11 (×15): qty 1

## 2011-01-11 MED ORDER — LISINOPRIL 5 MG PO TABS
5.0000 mg | ORAL_TABLET | Freq: Every day | ORAL | Status: DC
  Administered 2011-01-11 – 2011-01-16 (×6): 5 mg via ORAL
  Filled 2011-01-11 (×8): qty 1

## 2011-01-11 MED ORDER — POTASSIUM CHLORIDE 10 MEQ/100ML IV SOLN
10.0000 meq | INTRAVENOUS | Status: AC
  Administered 2011-01-11 (×4): 10 meq via INTRAVENOUS

## 2011-01-11 MED ORDER — FUROSEMIDE 10 MG/ML IJ SOLN
40.0000 mg | Freq: Two times a day (BID) | INTRAMUSCULAR | Status: DC
  Filled 2011-01-11 (×2): qty 4

## 2011-01-11 MED ORDER — ROSUVASTATIN CALCIUM 10 MG PO TABS
10.0000 mg | ORAL_TABLET | Freq: Every day | ORAL | Status: DC
  Administered 2011-01-11 – 2011-01-29 (×16): 10 mg via ORAL
  Filled 2011-01-11 (×19): qty 1

## 2011-01-11 MED ORDER — AMIODARONE HCL 150 MG/3ML IV SOLN: 60.0000 mg/h | INTRAVENOUS | Status: DC

## 2011-01-11 NOTE — Progress Notes (Signed)
Name: [name] MRN: [id number] DOB: 10-31-1945    LOS: 10  PCCM PROGRESS  NOTE  Patient Profile:  65 y/o F with PMHx of HTN suffered fall on 12/24/10 and LE pain. Initially taken to [facility] and diagnosed with R femoral DVT and started on anticoagulation. Pt had persistent pain and she had further imaging studies which showed R femoral fracture. Pt was transferred to [facility] for further management and was taken to the OR on 01/03/11 and underwent Open reduction and internal fixation of the right femur fracture. Post op suffered episode of Torsades and underwent 7-10 mins CPR. Subsequently developed AFRVR and hypotension. EKG demonstrated ST elevation on the inferior leads. Underwent cardiac cath 12/7. PCCM asked to assist with vent/CCM issues   Lines / Drains: 12/07 L IJ CVL >>> 12/14 (planned D/C after PICC) 12/07 ETT >>> 12/10, 12/11 >>> 12/14 12/14 PICC 12/14 (planned) >>> 12/15 Foley catheter (been on) >>>  Cultures: 12/10 Urine >>> UA negative 12/10 Respiratory culture >>> Normal Flora  Antibiotics: 12/12 PCT >>> 2.79,  12/15 >>> 1.57 12/10 Vancomycin >>> 12/11 12/10 Ceftaz (fever, purulent sputum, cloudy urine) >>>  Tests / Event 12/7: ORIF R femur fx 11/28 LE venous dopplers: Occlusive right femoropopliteal DVT 12/8 Cath: Severe 95% thrombotic lesion in the mid right coronary artery which was successfully stented with a bare metal stent, postdilated to greater than 3 mm in diameter. Severe left ventricular dysfunction with inferior apical and distal anterior hypokinesis of severe degree. The estimated ejection fraction is 25%. 12/8 CT head:  No acute abnormalities 12/8 Echo: Systolic function was moderately to severely reduced. The estimated ejection fraction was in the range of 30% to 35%. There is akinesis of the apical myocardium. There is akinesis of the inferoposterior myocardium. There is hypokinesis of the lateral myocardium 12/11 Reintubated for sudden onset resp  distress 12/11 CTA chest: No evidence of pulmonary embolus. Moderate bilateral effusions with significant consolidation and atelectasis in both lungs. Perihilar infiltration suggesting edema. Indeterminate mass or fluid collection in the right supraclavicular region measuring about 3 x 2.2 cm.  12/14 Extubated    ---------------------------------------------------------------------------------------------------  SUBJECTIVE: No acute event over night.  Patient is sitting in chair, awake, alert, and follow commands.  She states that she want a wheel chair so she can move around to see people. Did well bipap   OBJECTIVE: Vital Signs: Temp:  [97.6 F (36.4 C)-99.6 F (37.6 C)] 97.9 F (36.6 C) (12/15 0739) Pulse Rate:  [79-130] 81  (12/15 0700) Resp:  [20-38] 38  (12/15 0700) BP: (101-153)/(56-93) 153/79 mmHg (12/15 0700) SpO2:  [89 %-100 %] 100 % (12/15 0826) FiO2 (%):  [29.9 %-50.5 %] 40.2 % (12/14 1900) Weight:  [50.5 kg (111 lb 5.3 oz)] 111 lb 5.3 oz (50.5 kg) (12/15 0500) I/O last 3 completed shifts: In: 2666.8 [I.V.:1956.8; NG/GT:260; IV Piggyback:450] Out: 3135 [Urine:3135]  Physical Examination: General: Thin appearing female sitting in chair in mild acute distress but slightly confuse Neuro:  Awake, alert, and follow commands but slightly confuse HEENT: No JVD, no LAD, EOMI, no jaundice, no swelling, no discharge, mucosal moist CV:  Tachycardic, s1/s2 hear,  no s3/s4, no murmurs/rubs/gallops Lungs:   Coarse Abdomen:   Soft, non tender, non distended, (+)BS in all quadrants Extremities: Stocking up to knee level noted, right leg larger than left, minimal pain, no erythema  Ventilator settings: Patient is no longer intubated  DATA: BMET    Component Value Date/Time   NA 138 01/11/2011 0345  K 3.4* 01/11/2011 0345   CL 104 01/11/2011 0345   CO2 22 01/11/2011 0345   GLUCOSE 151* 01/11/2011 0345   BUN 10 01/11/2011 0345   CREATININE 0.55 01/11/2011 0345   CALCIUM  7.7* 01/11/2011 0345   GFRNONAA >90 01/11/2011 0345   GFRAA >90 01/11/2011 0345    CBC    Component Value Date/Time   WBC 14.2* 01/11/2011 0345   RBC 2.88* 01/11/2011 0345   HGB 8.0* 01/11/2011 0345   HCT 25.0* 01/11/2011 0345   PLT 482* 01/11/2011 0345   MCV 86.8 01/11/2011 0345   MCH 27.8 01/11/2011 0345   MCHC 32.0 01/11/2011 0345   RDW 18.4* 01/11/2011 0345   LYMPHSABS 1.1 01/01/2011 1645   MONOABS 0.8 01/01/2011 1645   EOSABS 0.2 01/01/2011 1645   BASOSABS 0.0 01/01/2011 1645     Radiologic Studies: 01/11/2011 PORTABLE CHEST - 1 VIEW Comparison: Chest 01/09/2011. Findings: Endotracheal tube, NG tube and left IJ catheter have all been removed. New right PICC is identified with the tip at the superior cavoatrial junction. Right worse than left effusions and airspace disease have increased. There is cardiomegaly. No pneumothorax. IMPRESSION: 1. Increased right greater than left effusions and airspace disease. 2. Support apparatus as described.  01/09/2011 PORTABLE CHEST - 1 VIEW Comparison: Portable chest and CT chest 01/08/2011 Findings: Right effusion and airspace disease have improved. Smaller left effusion and basilar airspace disease do not appear changed. There is cardiomegaly. No pneumothorax. IMPRESSION: Improved right effusion and basilar airspace disease. No change in smaller left effusion and basilar airspace opacity.  Assessment and Plan: Patient is a 65 y/o F with PMHx of HTN, DL who was diagnosed with R femoral DVT, started on anticoagulation and R femoral fracture s/p ORIF of the right femur fracture and on the post op period developed cardioresp arrest s/p CPR with cardiac arrhythmias (torsades, then afib with RVR and VT).   1. S/p cardiac arrest due to ischemic Torsade - IWMI, s/p bare metal stent Management per cardiology - On Aspirin and Plavix, add statin when extubated (need to do this today) - Blood pressure now allow ACEI and beta blocker Replace K, goal  greater 4 Check mag in am   2. Ventilator Dependent Respirator Failure Extubated 12/14 and tolerating Still elevated rr Lasix to neg balance, increase bipap to PRN pcxr in am  Reviewed CT chest  12th IS  3. Hypotension Resolved, now slightly hypertensive ACEI and beta blocker need to be on board Lasix for increase pulmonary effusion on right side?, and edema pattern  4. Atrial fibrillabion with RVR  Resolved, slightly tachycardic but sinus rhythm now Management per cardiology  5. R low extrem DVT  Heparin drip on board and therapeutic level is achieved  6. Anemia   Lab 01/11/11 0345 01/10/11 0442 01/09/11 0300 01/08/11 0400 01/07/11 1542  HCT 25.0* 25.7* 24.7* 26.2* 26.4*  No overt blood loss, low Hgb but is stable No absolute indication for PRBCs but in setting of possible ongoing ischemia, will have lower threshold to transfuse  7. Hypokalemia  Persist hypokalemia despite replace yesterday Will replace again and follow especially with torsades prior  8. Fever, PNA -  Fever spike yesterday but now afebrile Increase opacity in right lung with negative balance on CXR and stable elevated WBC, infiltrate vs. Effusion Cefatzidime is on board Add vanc if spike  Mcarthur Rossetti. Tyson Alias, MD, FACP Pgr: [phone] [facility]   01/11/2011, 8:47 AM

## 2011-01-11 NOTE — Progress Notes (Signed)
ANTICOAGULATION CONSULT NOTE - Follow Up Consult  Pharmacy Consult for Heparin Indication: recent DVT (11/27) and STEMI  No Known Allergies  Patient Measurements: Height: 5' (152.4 cm) Weight: 111 lb 5.3 oz (50.5 kg) IBW/kg (Calculated) : 45.5   Vital Signs: Temp: 98.8 F (37.1 C) (12/15 1133) Temp src: Oral (12/15 1133) BP: 140/73 mmHg (12/15 0900) Pulse Rate: 101  (12/15 1000)  Labs:  Basename 01/11/11 0345 01/10/11 0442 01/09/11 0750 01/09/11 0300  HGB 8.0* 8.2* -- --  HCT 25.0* 25.7* -- 24.7*  PLT 482* 479* -- 562*  APTT -- -- -- --  LABPROT -- -- -- --  INR -- -- -- --  HEPARINUNFRC 0.51 0.27* -- 0.40  CREATININE 0.55 0.63 -- 0.54  CKTOTAL -- -- 113 --  CKMB -- -- 3.5 --  TROPONINI -- -- 1.77* --   Estimated Creatinine Clearance: 49 ml/min (by C-G formula based on Cr of 0.55).  Assessment:    Heparin level of 0.27 is slightly subtherapeutic on 950 units/hr.    H/H low stable. PLTC 479K.  Goal of Therapy:  Heparin level 0.3-0.7 units/ml   Plan:  Continue heparin at 1050 units / hr Follow up AM heparin level  Thank you.  Elwin Sleight Pager: [phone] 01/11/2011,12:23 PM

## 2011-01-11 NOTE — Progress Notes (Signed)
Subjective:  Extubated with no complaints. No CP. No ectopy on telemetry personally reviewed. No further VT.   Objective:  Vital Signs in the last 24 hours: Temp:  [97.6 F (36.4 C)-99.6 F (37.6 C)] 98.8 F (37.1 C) (12/15 1133) Pulse Rate:  [81-130] 101  (12/15 1000) Resp:  [20-38] 30  (12/15 1000) BP: (101-153)/(56-93) 140/73 mmHg (12/15 0900) SpO2:  [89 %-100 %] 98 % (12/15 1000) FiO2 (%):  [40.2 %-50.5 %] 40.2 % (12/14 1900) Weight:  [50.5 kg (111 lb 5.3 oz)] 111 lb 5.3 oz (50.5 kg) (12/15 0500)  Intake/Output from previous day: 12/14 0701 - 12/15 0700 In: 1592.1 [I.V.:1192.1; NG/GT:50; IV Piggyback:350] Out: 2760 [Urine:2760]   Physical Exam: General: Well developed, well nourished, in no acute distress. Head:  Normocephalic and atraumatic. Lungs: Clear to auscultation and percussion. Heart: Tachycardic reg.  No murmur. +S4 Pulses: Pulses normal in all 4 extremities. Extremities: No clubbing or cyanosis. No edema. S/p right leg surgery Neurologic: Alert   Lab Results:  [facility] 01/11/11 0345 01/10/11 0442  WBC 14.2* 14.0*  HGB 8.0* 8.2*  PLT 482* 479*    Basename 01/11/11 0345 01/10/11 0442  NA 138 138  K 3.4* 3.3*  CL 104 106  CO2 22 22  GLUCOSE 151* 129*  BUN 10 11  CREATININE 0.55 0.63    Basename 01/09/11 0750  TROPONINI 1.77*   Hepatic Function Panel  Dg Chest Port 1 View  01/11/2011  *RADIOLOGY REPORT*  Clinical Data: Respiratory failure.  PORTABLE CHEST - 1 VIEW  Comparison: Chest 01/09/2011.  Findings: Endotracheal tube, NG tube and left IJ catheter have all been removed.  New right PICC is identified with the tip at the superior cavoatrial junction.  Right worse than left effusions and airspace disease have increased.  There is cardiomegaly.  No pneumothorax.  IMPRESSION:  1.  Increased right greater than left effusions and airspace disease. 2.  Support apparatus as described.  Original Report Authenticated By: Bernadene Bell. D'ALESSIO, M.D.     EKG:  Tele reviewed. No adverse arrhythmias  Cardiac Studies:  Cath - BMS to RCA. EF 25-35%  Assessment/Plan:   #1 NSTEMI complicated by cardiac arrest. Patient is status post bare-metal stent to the right coronary artery. Continue aspirin and Plavix. Statin added today. Crestor 10.  #2 ischemic cardiomyopathy-Will add ACEI and beta blocker low dose. Lisinopril 5mg  and metoprolol 12.5 BID. Recent hypotension resolved.    #3 ventricular and atrial arrhythmias-continue amiodarone; converting to po. Patient presently in sinus rhythm.  #4 recent DVT-continue intravenous heparin. No PE on CT scan.  #5 VDRF-management per critical care medicine.  #6 anemia-follow hemoglobin and transfuse as needed.  #7 pneumonia-Antibiotics per critical care medicine.  #8 status post repair of leg fracture-management per orthopedic surgery.  #9 Hypotension - resolved.       Daouda Lonzo 01/11/2011, 12:17 PM

## 2011-01-12 ENCOUNTER — Inpatient Hospital Stay (HOSPITAL_COMMUNITY): Payer: Medicare Other

## 2011-01-12 DIAGNOSIS — J9600 Acute respiratory failure, unspecified whether with hypoxia or hypercapnia: Secondary | ICD-10-CM

## 2011-01-12 DIAGNOSIS — I469 Cardiac arrest, cause unspecified: Secondary | ICD-10-CM

## 2011-01-12 DIAGNOSIS — Z9911 Dependence on respirator [ventilator] status: Secondary | ICD-10-CM

## 2011-01-12 DIAGNOSIS — R579 Shock, unspecified: Secondary | ICD-10-CM

## 2011-01-12 LAB — COMPREHENSIVE METABOLIC PANEL
AST: 337 U/L — ABNORMAL HIGH (ref 0–37)
BUN: 12 mg/dL (ref 6–23)
CO2: 23 mEq/L (ref 19–32)
Chloride: 103 mEq/L (ref 96–112)
Creatinine, Ser: 0.52 mg/dL (ref 0.50–1.10)
GFR calc non Af Amer: >90 mL/min (ref >90)
Total Bilirubin: 1.3 mg/dL — ABNORMAL HIGH (ref 0.3–1.2)

## 2011-01-12 LAB — HEPARIN LEVEL (UNFRACTIONATED): Heparin Unfractionated: 0.45 IU/mL (ref 0.30–0.70)

## 2011-01-12 LAB — BASIC METABOLIC PANEL
BUN: 12 mg/dL (ref 6–23)
Calcium: 8 mg/dL — ABNORMAL LOW (ref 8.4–10.5)
Creatinine, Ser: 0.58 mg/dL (ref 0.50–1.10)
GFR calc Af Amer: >90 mL/min (ref >90)
GFR calc non Af Amer: >90 mL/min (ref >90)
Potassium: 2.8 mEq/L — ABNORMAL LOW (ref 3.5–5.1)

## 2011-01-12 LAB — POCT I-STAT 3, ART BLOOD GAS (G3+)
O2 Saturation: 100 %
Patient temperature: 97.8
pCO2 arterial: 31.1 mmHg — ABNORMAL LOW (ref 35.0–45.0)

## 2011-01-12 LAB — DIFFERENTIAL
Basophils Absolute: 0 10*3/uL (ref 0.0–0.1)
Lymphocytes Relative: 14 % (ref 12–46)
Monocytes Absolute: 1.1 10*3/uL — ABNORMAL HIGH (ref 0.1–1.0)
Monocytes Relative: 8 % (ref 3–12)
Neutro Abs: 9.9 10*3/uL — ABNORMAL HIGH (ref 1.7–7.7)

## 2011-01-12 LAB — CBC
HCT: 26.8 % — ABNORMAL LOW (ref 36.0–46.0)
Hemoglobin: 8.5 g/dL — ABNORMAL LOW (ref 12.0–15.0)
RBC: 3.08 MIL/uL — ABNORMAL LOW (ref 3.87–5.11)
WBC: 12.8 10*3/uL — ABNORMAL HIGH (ref 4.0–10.5)

## 2011-01-12 MED ORDER — POTASSIUM CHLORIDE CRYS ER 20 MEQ PO TBCR
40.0000 meq | EXTENDED_RELEASE_TABLET | Freq: Once | ORAL | Status: AC
  Administered 2011-01-14: 40 meq via ORAL
  Filled 2011-01-12: qty 2

## 2011-01-12 MED ORDER — FUROSEMIDE 10 MG/ML IJ SOLN
80.0000 mg | Freq: Two times a day (BID) | INTRAMUSCULAR | Status: DC
  Administered 2011-01-12 – 2011-01-13 (×3): 80 mg via INTRAVENOUS
  Filled 2011-01-12 (×6): qty 8

## 2011-01-12 MED ORDER — POTASSIUM CHLORIDE 10 MEQ/100ML IV SOLN
10.0000 meq | INTRAVENOUS | Status: AC
  Administered 2011-01-12 (×5): 10 meq via INTRAVENOUS
  Filled 2011-01-12: qty 100
  Filled 2011-01-12: qty 400

## 2011-01-12 MED ORDER — POTASSIUM CHLORIDE 20 MEQ/15ML (10%) PO LIQD
80.0000 meq | Freq: Once | ORAL | Status: AC
  Administered 2011-01-12: 80 meq via ORAL
  Filled 2011-01-12: qty 60

## 2011-01-12 MED ORDER — POTASSIUM CHLORIDE 10 MEQ/100ML IV SOLN
10.0000 meq | INTRAVENOUS | Status: AC
  Administered 2011-01-12 (×2): 10 meq via INTRAVENOUS
  Filled 2011-01-12 (×2): qty 100

## 2011-01-12 NOTE — Progress Notes (Signed)
Replaced K 

## 2011-01-12 NOTE — Progress Notes (Signed)
Dr. Tyson Alias notified of K 2.7 and previous MD order for PO KCL. Updated regarding pt condition of decreased SPO2 and application of BIPAP by RT. Requested alternate route to administer KCL. Dr. Tyson Alias advised that he would place e-orders as soon as possible.

## 2011-01-12 NOTE — Progress Notes (Signed)
Subjective:  Now with increased work of breathing on BiPAP. Blood gas 7.5/30/150. No CP. Able to converse.   Objective:  Vital Signs in the last 24 hours: Temp:  [97 F (36.1 C)-98.3 F (36.8 C)] 97.8 F (36.6 C) (12/16 0733) Pulse Rate:  [88-105] 105  (12/16 0820) Resp:  [17-44] 27  (12/16 0820) BP: (111-159)/(65-104) 145/70 mmHg (12/16 0814) SpO2:  [70 %-100 %] 96 % (12/16 0820) FiO2 (%):  [60.7 %] 60.7 % (12/16 0820) Weight:  [48 kg (105 lb 13.1 oz)] 105 lb 13.1 oz (48 kg) (12/16 0500)  Intake/Output from previous day: 12/15 0701 - 12/16 0700 In: 1273.8 [I.V.:1115.8; IV Piggyback:158] Out: 3475 [Urine:3475]   Physical Exam: General: BiPAP, elderly appearing Head:  Normocephalic and atraumatic. Lungs: Decreased BS RLL Heart: Normal S1 and S2.  No murmur. +S4 Abd: soft, NT Extremities: No clubbing or cyanosis. No edema. SCD's Neurologic: Alert     Lab Results:  Basename 01/12/11 0522 01/11/11 0345  WBC 12.8* 14.2*  HGB 8.5* 8.0*  PLT 490* 482*    Basename 01/12/11 0522 01/11/11 0345  NA 140 138  K 2.7* 3.4*  CL 103 104  CO2 23 22  GLUCOSE 115* 151*  BUN 12 10  CREATININE 0.52 0.55   No results found for this basename: TROPONINI:2,CK,MB:2 in the last 72 hours Hepatic Function Panel  Basename 01/12/11 0522  PROT 6.7  ALBUMIN 2.3*  AST 337*  ALT 458*  ALKPHOS 131*  BILITOT 1.3*  BILIDIR --  IBILI --   No results found for this basename: CHOL in the last 72 hours No results found for this basename: PROTIME in the last 72 hours  Imaging: Dg Chest Port 1 View  01/12/2011  *RADIOLOGY REPORT*  Clinical Data: Evaluate endotracheal tube  PORTABLE CHEST - 1 VIEW  Comparison: 01/11/2011; 01/09/2011  Findings:  Grossly unchanged cardiac silhouette and mediastinal contours.  The patient remains extubated.  Unchanged positioning of right upper extremity approach PICC line with tip over the superior aspect of the right atrium. Minimally increased hazy  opacification involving the right mid and lower lung with consolidation inferiorly. Minimally improved aeration of the left lower lung.  No pneumothorax.  Unchanged bones.  IMPRESSION: 1.  Increased right-sided pleural effusion and adjacent opacities, possibly atelectasis, though infection not excluded. 2.  Minimally improved aeration of the left lower lung.  Original Report Authenticated By: Waynard Reeds, M.D.   Dg Chest Port 1 View  01/11/2011  *RADIOLOGY REPORT*  Clinical Data: Respiratory failure.  PORTABLE CHEST - 1 VIEW  Comparison: Chest 01/09/2011.  Findings: Endotracheal tube, NG tube and left IJ catheter have all been removed.  New right PICC is identified with the tip at the superior cavoatrial junction.  Right worse than left effusions and airspace disease have increased.  There is cardiomegaly.  No pneumothorax.  IMPRESSION:  1.  Increased right greater than left effusions and airspace disease. 2.  Support apparatus as described.  Original Report Authenticated By: Bernadene Bell. D'ALESSIO, M.D.    EKG:  Tele personally reviewed - no VT. No ectopy.   Cardiac Studies: Cath- BMS RCA EF 25-35%  Assessment/Plan:   #1 NSTEMI complicated by cardiac arrest. Patient is status post bare-metal stent to the right coronary artery. Continue aspirin and Plavix. Statin added. Crestor 10.  #2 ischemic cardiomyopathy-Added ACEI and beta blocker low dose. Lisinopril 5mg  and metoprolol 12.5 BID. Recent hypotension resolved. UOP poor. Increased lasix to 80IV BID. EF 25-35%. Systolic heart failure.  #  3 ventricular and atrial arrhythmias-continue amiodarone; converted to po. Patient presently in sinus rhythm.  #4 recent DVT-continue intravenous heparin. No PE on CT scan. Consider restart coumadin when able.  #5 VDRF-management per critical care medicine.  #6 anemia-follow hemoglobin and transfuse as needed.  #7 pneumonia-Antibiotics per critical care medicine.  #8 status post repair of leg  fracture-management per orthopedic surgery.  #9 Hypotension - BP low this am but now resolved.   #10 Hypokalemia - per CCM. Important to keep K>4 with recent VT.     Colten Desroches 01/12/2011, 11:39 AM

## 2011-01-12 NOTE — Progress Notes (Signed)
Pt taken off BiPAP. Placed on 3L Diaz. Breathing tx given and pt will be placed back on BiPAP in 4 hours.

## 2011-01-12 NOTE — Progress Notes (Signed)
CRITICAL VALUE ALERT  Critical value received:  K 2.7  Date of notification:  01/12/11  Time of notification:  0635  Critical value read back: yes  Nurse who received alert:  Jasper Riling   MD notified (1st page):  Dr. Marchelle Gearing  Time of first page:  [phone] (called e-link)  MD notified (2nd page): no need for second page   Time of second page: n/a  Responding MD:  Dr. Marchelle Gearing   Time MD responded:  [phone]

## 2011-01-12 NOTE — Progress Notes (Signed)
eLink Physician-Brief Progress Note Patient Name: [name] DOB: 05/13/1945 MRN: [id number]  Date of Service  01/12/2011   HPI/Events of Note  k 2.7   eICU Interventions  Po kcl x 1 now and repeat in 1 hour   Intervention Category Major Interventions: Electrolyte abnormality - evaluation and management  Vyom Brass 01/12/2011, 7:13 AM

## 2011-01-12 NOTE — Progress Notes (Signed)
ANTICOAGULATION CONSULT NOTE - Follow Up Consult  Pharmacy Consult for Heparin Indication: recent DVT (11/27) and STEMI  No Known Allergies  Patient Measurements: Height: 5' (152.4 cm) Weight: 105 lb 13.1 oz (48 kg) IBW/kg (Calculated) : 45.5   Vital Signs: Temp: 98.8 F (37.1 C) (12/16 1148) Temp src: Oral (12/16 1148) BP: 133/74 mmHg (12/16 1100) Pulse Rate: 91  (12/16 1158)  Labs:  Basename 01/12/11 0522 01/11/11 0345 01/10/11 0442  HGB 8.5* 8.0* --  HCT 26.8* 25.0* 25.7*  PLT 490* 482* 479*  APTT -- -- --  LABPROT -- -- --  INR -- -- --  HEPARINUNFRC 0.45 0.51 0.27*  CREATININE 0.52 0.55 0.63  CKTOTAL -- -- --  CKMB -- -- --  TROPONINI -- -- --   Estimated Creatinine Clearance: 49 ml/min (by C-G formula based on Cr of 0.52).  Assessment: Heparin level therapeutic No bleeding noted   Goal of Therapy:  Heparin level 0.3-0.7 units/ml   Plan:   Continue heparin at 1050 units / hr Follow up AM heparin level Start Coumadin when able  Thank you.  Elwin Sleight Pager: [phone] 01/12/2011,12:36 PM

## 2011-01-12 NOTE — Progress Notes (Signed)
Name: [name] MRN: [id number] DOB: 05-19-1945    LOS: 11  PCCM PROGRESS  NOTE  Patient Profile:  65 y/o F with PMHx of HTN suffered fall on 12/24/10 and LE pain. Initially taken to [facility] and diagnosed with R femoral DVT and started on anticoagulation. Pt had persistent pain and she had further imaging studies which showed R femoral fracture. Pt was transferred to [facility] for further management and was taken to the OR on 01/03/11 and underwent Open reduction and internal fixation of the right femur fracture. Post op suffered episode of Torsades and underwent 7-10 mins CPR. Subsequently developed AFRVR and hypotension. EKG demonstrated ST elevation on the inferior leads. Underwent cardiac cath 12/7. PCCM asked to assist with vent/CCM issues   Lines / Drains: 12/07 L IJ CVL >>> 12/14 (planned D/C after PICC) 12/07 ETT >>> 12/10, 12/11 >>> 12/14 12/14 PICC >>> 12/15 Foley catheter (been on) >>>  Cultures: 12/10 Urine >>> UA negative 12/10 Respiratory culture >>> Normal Flora  Antibiotics: 12/12 PCT >>> 2.79,  12/15 >>> 1.57 12/10 Vancomycin >>> 12/11 12/10 Ceftazidime (fever, purulent sputum, cloudy urine) >>>  Tests / Event 12/07 ORIF R femur fx 11/28  LE venous dopplers - Occlusive right femoropopliteal DVT 12/08 Cath - Severe 95% thrombotic lesion in the mid right coronary artery which was successfully stented with a bare  metal stent, postdilated to greater than 3 mm in diameter. Severe left ventricular dysfunction with inferior apical   and distal anterior hypokinesis of severe degree. The estimated ejection fraction is 25%. 12/08 CT head:  No acute abnormalities 12/08  Echo: Systolic function was moderately to severely reduced. The estimated ejection fraction was in the range of  30% to 35%. There is akinesis of the apical myocardium. There is akinesis of the inferoposterior myocardium.   There is hypokinesis of the lateral myocardium 12/11  Reintubated for sudden onset resp  distress 12/11  CTA chest: No evidence of pulmonary embolus. Moderate bilateral effusions with significant consolidation and   atelectasis in both lungs. Perihilar infiltration suggesting edema. Indeterminate mass or fluid collection in the right  supraclavicular region measuring about 3 x 2.2 cm.  12/14 Extubated    ---------------------------------------------------------------------------------------------------  SUBJECTIVE: Patient was extubated on 12/11/10 and was on room air and intermittent BiPAP.  She desaturate into the 60s this morning while lying in bed and BiPAP was initiated.  She is no asleep with mask on due to sleepless last night.  She was somewhat agitated last night and was confuse and talking about another female person being in the room.  Her potassium was at critical low value with AM labs and attending is aware and replacement order is in place.  OBJECTIVE: Vital Signs: Temp:  [97 F (36.1 C)-98.8 F (37.1 C)] 97.8 F (36.6 C) (12/16 0733) Pulse Rate:  [88-141] 105  (12/16 0820) Resp:  [17-44] 27  (12/16 0820) BP: (111-159)/(64-104) 145/70 mmHg (12/16 0814) SpO2:  [70 %-100 %] 96 % (12/16 0820) FiO2 (%):  [60.7 %] 60.7 % (12/16 0820) Weight:  [48 kg (105 lb 13.1 oz)] 105 lb 13.1 oz (48 kg) (12/16 0500) I/O last 3 completed shifts: In: 2001.9 [I.V.:1743.9; IV Piggyback:258] Out: 3995 [Urine:3995]  Physical Examination: General: Thin appearing female sleeping with BiPAP mask on and is arousable with name calling Neuro:  Asleep and slightly agitate upon awaken HEENT: No JVD, no LAD, EOMI, no jaundice, no swelling, no discharge, mucosal moist CV:  Tachycardic, s1/s2 hear,  no s3/s4, no murmurs/rubs/gallops Lungs:  CTAB, no adventitious sounds Abdomen:   Soft, non tender, non distended, (+)BS in all quadrants Extremities: Stocking up to knee level noted, right leg larger than left, minimal pain, no erythema  Ventilator settings: Patient is extubated on 12/11/10 but  is not on BiPAP due to desaturation into the 60s this AM  DATA: BMET    Component Value Date/Time   NA 140 01/12/2011 0522   K 2.7* 01/12/2011 0522   CL 103 01/12/2011 0522   CO2 23 01/12/2011 0522   GLUCOSE 115* 01/12/2011 0522   BUN 12 01/12/2011 0522   CREATININE 0.52 01/12/2011 0522   CALCIUM 8.0* 01/12/2011 0522   GFRNONAA >90 01/12/2011 0522   GFRAA >90 01/12/2011 0522    CBC    Component Value Date/Time   WBC 12.8* 01/12/2011 0522   RBC 3.08* 01/12/2011 0522   HGB 8.5* 01/12/2011 0522   HCT 26.8* 01/12/2011 0522   PLT 490* 01/12/2011 0522   MCV 87.0 01/12/2011 0522   MCH 27.6 01/12/2011 0522   MCHC 31.7 01/12/2011 0522   RDW 18.4* 01/12/2011 0522   LYMPHSABS 1.8 01/12/2011 0522   MONOABS 1.1* 01/12/2011 0522   EOSABS 0.0 01/12/2011 0522   BASOSABS 0.0 01/12/2011 0522     Radiologic Studies:  01/12/11 PORTABLE CHEST - 1 VIEW Comparison: 01/11/2011; 01/09/2011 Findings: Grossly unchanged cardiac silhouette and mediastinal contours. The patient remains extubated. Unchanged positioning of right upper extremity approach PICC line with tip over the superior aspect of the right atrium. Minimally increased hazy opacification involving the right mid and lower lung with consolidation inferiorly. Minimally improved aeration of the left lower lung. No pneumothorax. Unchanged bones. IMPRESSION: 1. Increased right-sided pleural effusion and adjacent opacities, possibly atelectasis, though infection not excluded. 2. Minimally improved aeration of the left lower lung.  01/11/2011 PORTABLE CHEST - 1 VIEW Comparison: Chest 01/09/2011. Findings: Endotracheal tube, NG tube and left IJ catheter have all been removed. New right PICC is identified with the tip at the superior cavoatrial junction. Right worse than left effusions and airspace disease have increased. There is cardiomegaly. No pneumothorax. IMPRESSION: 1. Increased right greater than left effusions and airspace disease. 2.  Support apparatus as described.  01/09/2011 PORTABLE CHEST - 1 VIEW Comparison: Portable chest and CT chest 01/08/2011 Findings: Right effusion and airspace disease have improved. Smaller left effusion and basilar airspace disease do not appear changed. There is cardiomegaly. No pneumothorax. IMPRESSION: Improved right effusion and basilar airspace disease. No change in smaller left effusion and basilar airspace opacity.  Assessment and Plan: Patient is a 65 y/o F with PMHx of HTN, DL who was diagnosed with R femoral DVT, started on anticoagulation and R femoral fracture s/p ORIF of the right femur fracture and on the post op period developed cardioresp arrest s/p CPR with cardiac arrhythmias (torsades, then afib with RVR and VT).   1. S/p cardiac arrest due to ischemic Torsade  WMI, s/p bare metal stent, management per cardiology - On Aspirin and Plavix, and Crestor was started - Lisinopril and Metoprolol are on board - Replace K with goal of >4.0, magnesium level is 2.5 (WNL) Following crt daily  2. Ventilator Dependent Respirator Failure Extubated 01/10/11, tolerating but desaturate into the 60s this AM and is now on BiPAP RR remain elevated Bipap will now schedule this, may require intubation however CXR with increase opacity on the right side despite negative balance with Lasix Continue neg balance, use NIMV to aerate rt base further WBC decreases to 12.8 today  Inspire spirometry when not on NIMV  3. Hypotension/Hypertension Hypotension resolved, now running normal to slightly hing Lisinopril and Metoprolol are on board for BP in present of CHF base on ECHO Lasix is on board for increasing right side pleural effusion / atx?  4. Atrial fibrillabion with RVR  Resolved, slightly tachycardic but sinus rhythm now Management per cardiology On Amiodarone  5. Right Lower Extremity DVT  Heparin drip on board and therapeutic level is achieved  6. Anemia   Lab 01/12/11 0522 01/11/11  0345 01/10/11 0442 01/09/11 0300 01/08/11 0400  HCT 26.8* 25.0* 25.7* 24.7* 26.2*  No overt blood loss, low Hgb but is stable No absolute indication for PRBCs but in setting of possible ongoing ischemia, will have lower threshold to transfuse Tolerating hep  7. Hypokalemia  Persist hypokalemia despite replace yesterday Will replace again and follow especially with torsades prior Chem in pm  And mag  8. Fever, PNA Fever spike yesterday but now afebrile Increase opacity in right lung with negative balance on CXR WBC still high but is dropping, infiltrate vs. Effusion Cefatzidime is on board, no need for additional antibiotic coverage at this point If worsen add vanc  Ccm time 30 min   Mcarthur Rossetti. Tyson Alias, MD, FACP Pgr: [phone] [facility]   01/12/2011, 9:41 AM

## 2011-01-13 ENCOUNTER — Inpatient Hospital Stay (HOSPITAL_COMMUNITY): Payer: Medicare Other

## 2011-01-13 LAB — CBC
HCT: 27.5 % — ABNORMAL LOW (ref 36.0–46.0)
MCH: 27.7 pg (ref 26.0–34.0)
MCHC: 31.3 g/dL (ref 30.0–36.0)
MCV: 88.4 fL (ref 78.0–100.0)
RDW: 19.5 % — ABNORMAL HIGH (ref 11.5–15.5)

## 2011-01-13 LAB — DIFFERENTIAL
Eosinophils Relative: 0 % (ref 0–5)
Lymphocytes Relative: 16 % (ref 12–46)
Lymphs Abs: 1.9 10*3/uL (ref 0.7–4.0)
Neutro Abs: 8.8 10*3/uL — ABNORMAL HIGH (ref 1.7–7.7)
Neutrophils Relative %: 76 % (ref 43–77)

## 2011-01-13 LAB — POCT I-STAT 3, ART BLOOD GAS (G3+)
Bicarbonate: 23 mEq/L (ref 20.0–24.0)
TCO2: 24 mmol/L (ref 0–100)
pCO2 arterial: 26.2 mmHg — ABNORMAL LOW (ref 35.0–45.0)
pH, Arterial: 7.553 — ABNORMAL HIGH (ref 7.350–7.400)
pO2, Arterial: 84 mmHg (ref 80.0–100.0)

## 2011-01-13 LAB — COMPREHENSIVE METABOLIC PANEL
ALT: 297 U/L — ABNORMAL HIGH (ref 0–35)
Calcium: 8.6 mg/dL (ref 8.4–10.5)
Creatinine, Ser: 0.53 mg/dL (ref 0.50–1.10)
GFR calc Af Amer: >90 mL/min (ref >90)
Glucose, Bld: 111 mg/dL — ABNORMAL HIGH (ref 70–99)
Sodium: 144 mEq/L (ref 135–145)
Total Protein: 6.8 g/dL (ref 6.0–8.3)

## 2011-01-13 MED ORDER — ASPIRIN 81 MG PO CHEW: 162.0000 mg | CHEWABLE_TABLET | Freq: Every day | ORAL | Status: DC

## 2011-01-13 MED ORDER — STARCH (THICKENING) PO POWD
ORAL | Status: DC | PRN
  Filled 2011-01-13 (×3): qty 227

## 2011-01-13 MED ORDER — AMIODARONE HCL 200 MG PO TABS
200.0000 mg | ORAL_TABLET | Freq: Every day | ORAL | Status: DC
  Administered 2011-01-13 – 2011-02-07 (×23): 200 mg via ORAL
  Filled 2011-01-13 (×26): qty 1

## 2011-01-13 MED ORDER — ASPIRIN 81 MG PO CHEW
81.0000 mg | CHEWABLE_TABLET | Freq: Every day | ORAL | Status: DC
  Administered 2011-01-13 – 2011-01-17 (×5): 81 mg via ORAL
  Filled 2011-01-13 (×6): qty 1

## 2011-01-13 NOTE — Progress Notes (Signed)
Nutrition Consult/Follow-up  Pt extubated 12/14. RD consult for assessment of nutrition status/requirements. Initial nutrition assessment completed 12/13. Noted bedside swallow evaluation ordered. Pt confused at this time, RD spoke with family -- unsure of pt's weight hx and PO intake PTA.  Diet Order:  NPO  Re-estimated Nutrition Needs: 1,200-1,400 kcals, 65-75 gm protein  Meds: Scheduled Meds:   . amiodarone  200 mg Oral Daily  . antiseptic oral rinse  15 mL Mouth Rinse q12n4p  . aspirin  81 mg Oral Daily  . cefTAZidime (FORTAZ)  IV  1 g Intravenous Q8H  . chlorhexidine  15 mL Mouth Rinse BID  . clopidogrel  75 mg Oral Q breakfast  . cyanocobalamin  1,000 mcg Intramuscular Weekly  . furosemide  80 mg Intravenous BID  . levalbuterol  0.63 mg Nebulization Q6H  . lisinopril  5 mg Oral Daily  . metoprolol tartrate  12.5 mg Oral BID  . potassium chloride  10 mEq Intravenous Q1 Hr x 5  . potassium chloride  10 mEq Intravenous Q1 Hr x 2  . potassium chloride  80 mEq Oral Once  . potassium chloride  40 mEq Oral Once  . rosuvastatin  10 mg Oral Daily  . sodium chloride  10 mL Intracatheter Q12H  . DISCONTD: amiodarone  400 mg Oral BID  . DISCONTD: aspirin  162 mg Oral Daily  . DISCONTD: aspirin  325 mg Oral Daily  . DISCONTD: calcium gluconate  1 g Intravenous Once  . DISCONTD: furosemide  40 mg Intravenous BID   Continuous Infusions:   . sodium chloride 20 mL/hr at 01/13/11 1000  . heparin 10.5 Units/kg/hr (01/13/11 1000)  . DISCONTD: phenylephrine (NEO-SYNEPHRINE) Adult infusion Stopped (01/10/11 1428)   PRN Meds:.sodium chloride, acetaminophen, bisacodyl, levalbuterol, magnesium hydroxide, ondansetron (ZOFRAN) IV, phenol, sodium chloride, DISCONTD: fentaNYL  Labs:  CMP     Component Value Date/Time   NA 144 01/13/2011 0615   K 4.0 01/13/2011 0615   CL 109 01/13/2011 0615   CO2 23 01/13/2011 0615   GLUCOSE 111* 01/13/2011 0615   BUN 11 01/13/2011 0615   CREATININE 0.53  01/13/2011 0615   CALCIUM 8.6 01/13/2011 0615   PROT 6.8 01/13/2011 0615   ALBUMIN 2.5* 01/13/2011 0615   AST 115* 01/13/2011 0615   ALT 297* 01/13/2011 0615   ALKPHOS 120* 01/13/2011 0615   BILITOT 1.2 01/13/2011 0615   GFRNONAA >90 01/13/2011 0615   GFRAA >90 01/13/2011 0615     Intake/Output Summary (Last 24 hours) at 01/13/11 1149 Last data filed at 01/13/11 1000  Gross per 24 hour  Intake 1382.16 ml  Output   1815 ml  Net -432.84 ml    Weight Status:  46.7 kg (12/17) -- down likely due to fluid loss  Nutrition Dx:  Inadequate Oral Intake, ongoing  Goal:  Meet >90% of estimated nutrition needs, unmet Monitor: PO diet advancement/intake, weight, labs, I/O's  Intervention/Plan:  Await bedside swallow evaluation/SLP recommendations  RD to follow for nutrition care plan, add interventions accordingly    Alger Memos Pager #:  [phone]

## 2011-01-13 NOTE — Progress Notes (Signed)
Pt placed back on BiPAP per order. Pt is very confused and agitated at this time. Still pulling on mask and pulling off leads. Unsure if pt will last for 4 hours on.

## 2011-01-13 NOTE — Progress Notes (Signed)
@   Subjective:  Mild dyspnea yesterday; none this AM; no chest pain.   Objective:  Filed Vitals:   01/13/11 0400 01/13/11 0420 01/13/11 0500 01/13/11 0600  BP: 111/74 111/74 109/59 125/82  Pulse: 89 94 90 96  Temp:      TempSrc:      Resp: 24 24 31 23   Height:      Weight:    103 lb (46.72 kg)  SpO2: 100% 98% 97% 98%    Intake/Output from previous day:  Intake/Output Summary (Last 24 hours) at 01/13/11 0700 Last data filed at 01/13/11 0400  Gross per 24 hour  Intake 1459.83 ml  Output   1805 ml  Net -345.17 ml    Physical Exam: Physical exam: Intubated Skin is warm and dry.  HEENT is normal.  Neck is supple. No thyromegaly.  Chest CTA Cardiovascular exam RRR; + gallop Abdominal exam nontender or distended. No masses palpated. Extremies s/p right leg surgery with trace edema Neuro moves all extremities     Lab Results: Basic Metabolic Panel:  Basename 01/12/11 1850 01/12/11 0522  NA 139 140  K 2.8* 2.7*  CL 102 103  CO2 25 23  GLUCOSE 111* 115*  BUN 12 12  CREATININE 0.58 0.52  CALCIUM 8.0* 8.0*  MG 1.7 --  PHOS 1.3* --   CBC:  Basename 01/13/11 0615 01/12/11 0522  WBC 11.7* 12.8*  NEUTROABS 8.8* 9.9*  HGB 8.6* 8.5*  HCT 27.5* 26.8*  MCV 88.4 87.0  PLT 455* 490*      Assessment/Plan:  #1 S/P MI complicated by cardiac arrest. Patient is status post bare-metal stent to the right coronary artery. Continue aspirin and Plavix. Change ASA to 81 mg daily; dc plavix after 30 days given need for coumadin. #2 ischemic cardiomyopathy-Continue ACEI and beta blocker. Continue diuresis but watch renal function. #3 ventricular and atrial arrhythmias-change amiodarone to 200 mg daily; DC in 6-8 weeks if she maintains sinus. #4 recent DVT-continue intravenous heparin. No PE on CT scan. Begin coumadin when ok with surgery; management per primary service. #5 VDRF- resolved #6 anemia #7 pneumonia-Antibiotics per critical care medicine. #8 status post repair of  leg fracture-management per orthopedic surgery. #9 hypokalemia - supplement   Olga Millers 01/13/2011, 7:00 AM

## 2011-01-13 NOTE — Plan of Care (Signed)
Problem: Phase I Progression Outcomes Goal: Patient tolerating nututrition at goal Outcome: Not Progressing Pt has poor appetite, swallow eval today--placed on nectar thick diet with percautions  Problem: Phase II Progression Outcomes Goal: Progress activities as ordered Outcome: Not Progressing Pt is very weak and dementia makes progressing activities or participating in ADLs difficult

## 2011-01-13 NOTE — Progress Notes (Signed)
Name: [name] MRN: [id number] DOB: 05/20/1945    LOS: 12  PCCM PROGRESS NOTE  History of Present Illness: 65 y/o F with PMHx of HTN suffered fall on 12/24/10 and LE pain. Initially taken to [facility] and diagnosed with R femoral DVT and started on anticoagulation. Pt had persistent pain and she had further imaging studies which showed R femoral fracture. Pt was transferred to [facility] for further management and was taken to the OR on 01/03/11 and underwent Open reduction and internal fixation of the right femur fracture. Post op suffered episode of Torsades and underwent 7-10 mins CPR. Subsequently developed AFRVR and hypotension. EKG demonstrated ST elevation on the inferior leads. Underwent cardiac cath 12/7. PCCM asked to assist with vent/CCM issues  Lines / Drains: 12/07 L IJ CVL>>> 12/14 12/07 ETT>>> 12/10, 12/11 >>> 12/14  12/14 PICC >>>  12/15 Foley>>>  Cultures: 12/10 UC>>>neg 12/10 RC>>>neg  Antibiotics: 12/10 Vancomycin >>> 12/11  12/10 Ceftazidime (fever, purulent sputum, cloudy urine) >>>  Tests / Events: 12/07 ORIF R femur fx  11/28 LE venous dopplers - Occlusive right femoropopliteal DVT  12/08 Cath - Severe 95% thrombotic lesion in the mid right coronary artery which was successfully stented with a bare metal stent, postdilated to greater than 3 mm in diameter. Severe left ventricular dysfunction with inferior apical and distal anterior hypokinesis of severe degree. The estimated ejection fraction is 25%.  12/08 CT head: No acute abnormalities  12/08 Echo: Systolic function was moderately to severely reduced. The estimated ejection fraction was in the range of 30% to 35%. There is akinesis of the apical myocardium. There is akinesis of the inferoposterior myocardium. There is hypokinesis of the lateral myocardium  12/11 Reintubated for sudden onset resp distress  12/11 CTA chest: No evidence of pulmonary embolus. Moderate bilateral effusions with significant consolidation and  atelectasis in both lungs. Perihilar infiltration suggesting edema. Indeterminate mass or fluid collection in the right supraclavicular region measuring about 3 x 2.2 cm.  12/14 Extubated  Overnight:  Remains extubated.  Minimal BiPAP use.  Vital Signs: Temp:  [97.1 F (36.2 C)-98.8 F (37.1 C)] 98.2 F (36.8 C) (12/17 0742) Pulse Rate:  [85-107] 96  (12/17 0700) Resp:  [16-31] 21  (12/17 0700) BP: (86-134)/(48-85) 125/82 mmHg (12/17 0600) SpO2:  [97 %-100 %] 97 % (12/17 0818) FiO2 (%):  [29.6 %-40.9 %] 29.6 % (12/17 0400) Weight:  [46.72 kg (103 lb)] 103 lb (46.72 kg) (12/17 0600) I/O last 3 completed shifts: In: 2178 [I.V.:1224; IV Piggyback:954] Out: 3515 [Urine:3515]  Physical Examination: General:  No acute distress, cooperative Neuro:  Confused but follows commands, nonfocal   HEENT:  PERRL Neck:  No JVD   Cardiovascular:  Regular rate, no murmurs Lungs:  Clear to auscultation, decreased air movement Abdomen:  Soft, nontender, bowel sounds present Musculoskeletal:  No LE edema Skin:  Intact   Ventilator settings: Vent Mode:  [-]  FiO2 (%):  [29.6 %-40.9 %] 29.6 %  Labs and Imaging:  Reviewed.  Please refer to the Assessment and Plan section for relevant results.  Assessment and Plan:  Ischemic cardiomyopathy, MI, status post torsades cardiac arrest, atrial fibrillation with RVR, hypertension.  Status post bare metal stent placement. Cardiology following.  Lab 01/13/11 0615 01/12/11 1850 01/12/11 0522 01/11/11 0345 01/10/11 0442  CREATININE 0.53 0.58 0.52 0.55 0.63   -->continue ASA, Paix (d/c after 30 days per Cardiology) -->continue Metoprolol, Lisinopril -->continue Crestor -->continue Amiodarone   (for 6-8 weeks per Cardiology) -->would appreciate input from  Ortho as to starting Coumadin -->continue Lasix   Hypokalemia, resolved  Lab 01/13/11 0615 01/12/11 1850 01/12/11 0522 01/11/11 0345 01/10/11 0442  K 4.0 2.8* 2.7* 3.4* 3.3*   -->monitor K as on  diuretics  Pneumonia, no afebrile with improving   Lab 01/13/11 0615 01/12/11 0522 01/11/11 0345 01/10/11 0442 01/09/11 0300  WBC 11.7* 12.8* 14.2* 14.0* 15.1*   -->continue Ceftazidime, stop 12/18 -->monitor fever / WBC -->d/c BiPAP -->Insentive Spirometry / out of bed -->supplemental oxygen / bronchodilators  Right Lower Extremity DVT  -->Heparin drip on board and therapeutic level is achieved  -->will start Coumadin after discussing with Ortho  Anemia, stable   Lab 01/13/11 0615 01/12/11 0522 01/11/11 0345 01/10/11 0442 01/09/11 0300  HCT 27.5* 26.8* 25.0* 25.7* 24.7*   -->monitor Hct  Malnutrition -->swallow assessment -->nutrition assessment  Encephalopathy / delirium -->monitor  Best practices / Disposition -->ICU status under PCCM -->full code -->DVT Px is not indicated as on therapeutic Heparin -->GI Px is not indicated -->NPO  Orlean Bradford, M.D. Pulmonary and Critical Care Medicine [facility] Cell: [phone] Pager: [phone]  01/13/2011, 9:28 AM

## 2011-01-13 NOTE — Progress Notes (Signed)
ANTICOAGULATION CONSULT NOTE - Follow Up Consult  Pharmacy Consult for Heparin Indication: recent DVT (11/27) and STEMI  No Known Allergies  Patient Measurements: Height: 5' (152.4 cm) Weight: 103 lb (46.72 kg) IBW/kg (Calculated) : 45.5  Adjusted Body Weight:   Vital Signs: Temp: 98.2 F (36.8 C) (12/17 0742) Temp src: Oral (12/17 0742) BP: 125/82 mmHg (12/17 0600) Pulse Rate: 96  (12/17 0700)  Labs:  Basename 01/13/11 0615 01/12/11 1850 01/12/11 0522 01/11/11 0345  HGB 8.6* -- 8.5* --  HCT 27.5* -- 26.8* 25.0*  PLT 455* -- 490* 482*  APTT -- -- -- --  LABPROT -- -- -- --  INR -- -- -- --  HEPARINUNFRC 0.43 -- 0.45 0.51  CREATININE 0.53 0.58 0.52 --  CKTOTAL -- -- -- --  CKMB -- -- -- --  TROPONINI -- -- -- --   Estimated Creatinine Clearance: 49 ml/min (by C-G formula based on Cr of 0.53).   Medications:  Heparin 1050 units/hr  Assessment: 65yof on heparin for recent DVT and STEMI. Heparin level (0.43) remains therapeutic on heparin 1050 units/hr.    Goal of Therapy:  Heparin level 0.3-0.7 units/ml   Plan:  1. Continue heparin 1050 units/hr (10.5 ml/hr) 2. Follow up AM heparin level and Coumadin restart per surgery  [name] [phone] 01/13/2011,9:39 AM

## 2011-01-13 NOTE — Progress Notes (Signed)
UR Completed.  Alaylah Heatherington Jane. [phone]. 01/13/2011  

## 2011-01-13 NOTE — Progress Notes (Signed)
This note also relates to the following rows which could not be included: Pulse Rate - Cannot attach notes to rows marked as read only  01/13/11 0420  BiPAP/CPAP /SiPAP Vitals  Resp ! 24   BP 111/74 mmHg  SpO2 98 %  Bilateral Breath Sounds Clear;Diminished     01/13/11 0420  BiPAP/CPAP /SiPAP Vitals  Resp ! 24   BP 111/74 mmHg  SpO2 98 %  Bilateral Breath Sounds Clear;Diminished   Pt taken off BiPAP and placed on 2L [place]. Pt tolerating well.

## 2011-01-13 NOTE — Progress Notes (Signed)
Speech Language/Pathology Clinical/Bedside Swallow Evaluation Patient Details  Name: [name] MRN: [id number] DOB: [birth year] Today's Date: 01/13/2011  Past Medical History:  Past Medical History  Diagnosis Date  . Hypertension   . 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 Vitamin B12 deficiency (dietary) anemia 12/27/2010  . Arthritis   . MI (myocardial infarction) 01/09/2011   Past Surgical History:  Past Surgical History  Procedure Date  . Abdominal hysterectomy     06/2010  . Femur im nail 01/03/2011    Procedure: INTRAMEDULLARY (IM) RETROGRADE FEMORAL NAILING;  Surgeon: Budd Palmer;  Location: [facility] OR;  Service: Orthopedics;  Laterality: Right;  Right IM Retrograde Femoral Nailing    Assessment/Recommendations/Treatment Plan    SLP Assessment Clinical Impression Statement: Patient with mutliple aspiration risk factors including 2 accounts of intubation (12/8-12/10; 12/12-12/15), RLL PNA, and AMS. Patient showed no overt s/s of aspiration on bolus trials with the exception of one delayed throat clear after a cup sip of water. Patient with prolonged mastication secondary to cognitive deficits and missing upper dentition, however remains functional. Given high aspiration risk factors recommend convervative diet of dysphagia 3 (mech soft) and nectar thick liquids. Will continue to observe at bedside for potential for advancement.  Risk for Aspiration: Moderate Other Related Risk Factors: Cognitive impairment;History of pneumonia  Recommendations Solid Consistency: Dysphagia 3 (Mechanical soft) Liquid Consistency: Nectar Liquid Administration via: Cup;No straw Medication Administration: Whole meds with puree Supervision: Full supervision/cueing for compensatory strategies Compensations: Slow rate;Small sips/bites Postural Changes and/or Swallow Maneuvers: Seated upright 90 degrees Oral Care Recommendations: Oral care BID Other Recommendations: Order thickener  from pharmacy;Prohibited food (jello, ice cream, thin soups);Remove water pitcher  Treatment Plan Treatment Plan Recommendations: Therapy as outlined in treatment plan below Speech Therapy Frequency: min 2x/week Treatment Duration: 2 weeks Interventions: Aspiration precaution training;Compensatory techniques;Patient/family education;Trials of upgraded texture/liquids;Diet toleration management by SLP  Prognosis Prognosis for Safe Diet Advancement: Good Barriers to Reach Goals: Cognitive deficits  Individuals Consulted Consulted and Agree with Results and Recommendations: Patient;RN  Swallowing Goals  SLP Swallowing Goals Patient will consume recommended diet without observed clinical signs of aspiration with: Minimal assistance Patient will utilize recommended strategies during swallow to increase swallowing safety with: Moderate assistance  Chyrel Masson, Speech Pathology Student 01/13/2011

## 2011-01-14 ENCOUNTER — Inpatient Hospital Stay (HOSPITAL_COMMUNITY): Payer: Medicare Other

## 2011-01-14 DIAGNOSIS — I469 Cardiac arrest, cause unspecified: Secondary | ICD-10-CM

## 2011-01-14 DIAGNOSIS — J9600 Acute respiratory failure, unspecified whether with hypoxia or hypercapnia: Secondary | ICD-10-CM

## 2011-01-14 DIAGNOSIS — R579 Shock, unspecified: Secondary | ICD-10-CM

## 2011-01-14 DIAGNOSIS — Z9911 Dependence on respirator [ventilator] status: Secondary | ICD-10-CM

## 2011-01-14 LAB — MAGNESIUM: Magnesium: 1.8 mg/dL (ref 1.5–2.5)

## 2011-01-14 LAB — BASIC METABOLIC PANEL
BUN: 11 mg/dL (ref 6–23)
BUN: 12 mg/dL (ref 6–23)
Chloride: 105 mEq/L (ref 96–112)
Chloride: 105 mEq/L (ref 96–112)
Creatinine, Ser: 0.61 mg/dL (ref 0.50–1.10)
GFR calc Af Amer: >90 mL/min (ref >90)
GFR calc Af Amer: >90 mL/min (ref >90)
GFR calc non Af Amer: >90 mL/min (ref >90)
Glucose, Bld: 118 mg/dL — ABNORMAL HIGH (ref 70–99)
Potassium: 2.5 mEq/L — CL (ref 3.5–5.1)
Potassium: 2.9 mEq/L — ABNORMAL LOW (ref 3.5–5.1)
Sodium: 142 mEq/L (ref 135–145)

## 2011-01-14 LAB — CBC
HCT: 26.7 % — ABNORMAL LOW (ref 36.0–46.0)
Hemoglobin: 8.4 g/dL — ABNORMAL LOW (ref 12.0–15.0)
RBC: 3.03 MIL/uL — ABNORMAL LOW (ref 3.87–5.11)
WBC: 9.6 10*3/uL (ref 4.0–10.5)

## 2011-01-14 LAB — PROTIME-INR: INR: 1.42 (ref 0.00–1.49)

## 2011-01-14 LAB — PHOSPHORUS: Phosphorus: 1.8 mg/dL — ABNORMAL LOW (ref 2.3–4.6)

## 2011-01-14 LAB — HEPARIN LEVEL (UNFRACTIONATED): Heparin Unfractionated: 0.46 IU/mL (ref 0.30–0.70)

## 2011-01-14 MED ORDER — POTASSIUM CHLORIDE CRYS ER 20 MEQ PO TBCR
EXTENDED_RELEASE_TABLET | ORAL | Status: AC
  Administered 2011-01-14: 40 meq via ORAL
  Filled 2011-01-14: qty 2

## 2011-01-14 MED ORDER — POTASSIUM PHOSPHATE DIBASIC 3 MMOLE/ML IV SOLN
30.0000 mmol | Freq: Once | INTRAVENOUS | Status: AC
  Administered 2011-01-14: 30 mmol via INTRAVENOUS
  Filled 2011-01-14: qty 10

## 2011-01-14 MED ORDER — WARFARIN SODIUM 5 MG PO TABS
5.0000 mg | ORAL_TABLET | Freq: Once | ORAL | Status: AC
  Administered 2011-01-14: 5 mg via ORAL
  Filled 2011-01-14: qty 1

## 2011-01-14 MED ORDER — FUROSEMIDE 10 MG/ML IJ SOLN
40.0000 mg | Freq: Two times a day (BID) | INTRAMUSCULAR | Status: DC
  Administered 2011-01-14 – 2011-01-16 (×6): 40 mg via INTRAVENOUS
  Filled 2011-01-14 (×6): qty 4

## 2011-01-14 MED ORDER — POTASSIUM CHLORIDE 20 MEQ/15ML (10%) PO LIQD
40.0000 meq | Freq: Once | ORAL | Status: DC
  Filled 2011-01-14: qty 30

## 2011-01-14 MED ORDER — POTASSIUM CHLORIDE CRYS ER 20 MEQ PO TBCR
40.0000 meq | EXTENDED_RELEASE_TABLET | Freq: Once | ORAL | Status: AC
  Administered 2011-01-14: 40 meq via ORAL

## 2011-01-14 NOTE — Progress Notes (Signed)
ANTICOAGULATION CONSULT NOTE - Follow Up Consult  Pharmacy Consult for Heparin Indication: recent DVT (11/27) and STEMI  No Known Allergies  Patient Measurements: Height: 5' (152.4 cm) Weight: 97 lb 4.8 oz (44.135 kg) IBW/kg (Calculated) : 45.5  Adjusted Body Weight:   Vital Signs: Temp: 97.8 F (36.6 C) (12/18 0736) Temp src: Oral (12/18 0736) BP: 127/73 mmHg (12/18 0600) Pulse Rate: 84  (12/18 0700)  Labs:  Basename 01/14/11 0445 01/13/11 0615 01/12/11 1850 01/12/11 0522  HGB 8.4* 8.6* -- --  HCT 26.7* 27.5* -- 26.8*  PLT 384 455* -- 490*  APTT -- -- -- --  LABPROT 17.6* -- -- --  INR 1.42 -- -- --  HEPARINUNFRC 0.46 0.43 -- 0.45  CREATININE 0.56 0.53 0.58 --  CKTOTAL -- -- -- --  CKMB -- -- -- --  TROPONINI -- -- -- --   Estimated Creatinine Clearance: 47.5 ml/min (by C-G formula based on Cr of 0.56).   Medications:  Heparin 1050 units/hr  Assessment: 65yof on heparin for recent DVT and STEMI. Heparin level (0.46) remains therapeutic on heparin 1050 units/hr.    Goal of Therapy:  Heparin level 0.3-0.7 units/ml   Plan:  1. Continue heparin 1050 units/hr (10.5 ml/hr) 2. Follow up AM heparin level and Coumadin restart per surgery  [name] [phone] 01/14/2011,8:56 AM

## 2011-01-14 NOTE — Progress Notes (Signed)
Physical Therapy Evaluation Patient Details Name: [name] MRN: [id number] DOB: [birth year] Today's Date: 01/14/2011  Problem List:  Patient Active Problem List  Diagnoses  . HTN (hypertension)  . Femoral DVT (deep venous thrombosis)  . Anemia  . Hyponatremia  . Vitamin B12 deficiency (dietary) anemia  . Lupus anticoagulant positive  . Femur fracture, right  . Osteoporosis  . Cardiogenic shock  . MI (myocardial infarction)    Past Medical History:  Past Medical History  Diagnosis Date  . Hypertension   . 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 Vitamin B12 deficiency (dietary) anemia 12/27/2010  . Arthritis   . MI (myocardial infarction) 01/09/2011   Past Surgical History:  Past Surgical History  Procedure Date  . Abdominal hysterectomy     06/2010  . Femur im nail 01/03/2011    Procedure: INTRAMEDULLARY (IM) RETROGRADE FEMORAL NAILING;  Surgeon: Budd Palmer;  Location: [facility] OR;  Service: Orthopedics;  Laterality: Right;  Right IM Retrograde Femoral Nailing    PT Assessment/Plan/Recommendation PT Assessment Clinical Impression Statement: Pt s/p rt. femur ORIF.  Pt also with significant confusion from baseline that will limit progress.  Will need ST-SNF after dc. PT Recommendation/Assessment: Patient will need skilled PT in the acute care venue PT Problem List: Decreased strength;Decreased activity tolerance;Decreased balance;Decreased mobility;Decreased cognition;Decreased knowledge of use of DME;Decreased knowledge of precautions;Pain Barriers to Discharge: Decreased caregiver support PT Therapy Diagnosis : Difficulty walking;Acute pain;Altered mental status PT Plan PT Frequency: Min 2X/week PT Treatment/Interventions: DME instruction;Functional mobility training;Therapeutic activities;Therapeutic exercise;Balance training;Patient/family education PT Recommendation Follow Up Recommendations: Skilled nursing facility Equipment Recommended: Defer to  next venue PT Goals  Acute Rehab PT Goals PT Goal Formulation: Patient unable to participate in goal setting Time For Goal Achievement: 2 weeks Pt will go Supine/Side to Sit: with min assist PT Goal: Supine/Side to Sit - Progress: Not met Pt will go Sit to Supine/Side: with min assist PT Goal: Sit to Supine/Side - Progress: Not met Pt will go Sit to Stand: with +2 total assist (pt = 40%) PT Goal: Sit to Stand - Progress: Not met Pt will go Stand to Sit: with +2 total assist (pt = 40%) Pt will Transfer Bed to Chair/Chair to Bed: with mod assist  PT Evaluation Precautions/Restrictions  Restrictions Weight Bearing Restrictions: Yes RLE Weight Bearing: Touchdown weight bearing Prior Functioning  Home Living Lives With: Alone Type of Home: House Home Layout: Two level;Able to live on main level with bedroom/bathroom Alternate Level Stairs-Rails: Right Alternate Level Stairs-Number of Steps: full flight but doesn't have to go up stairs Home Access: Stairs to enter Entrance Stairs-Rails: Right Entrance Stairs-Number of Steps: 2 Home Adaptive Equipment: None Additional Comments: Pt will need ST-SNF at dc Prior Function Level of Independence: Independent with basic ADLs;Independent with homemaking with ambulation;Independent with gait;Independent with transfers Driving: Yes Vocation: Part time employment Comments: Before admission to [facility]. Cognition Cognition Arousal/Alertness: Awake/alert Overall Cognitive Status: Impaired Attention: Impaired Current Attention Level: Focused Attention - Other Comments: Pt unable to stay on task during therapy. Orientation Level: Oriented to person;Disoriented to situation;Disoriented to place;Disoriented to time Following Commands: Follows one step commands inconsistently (due to decr attention) Sensation/Coordination   Extremity Assessment RLE Strength RLE Overall Strength Comments: grossly 2/5 LLE Assessment LLE Assessment: Within  Functional Limits Mobility (including Balance) Transfers Sit to Stand: 1: +2 Total assist;Patient percentage (comment) (pt = 25%) Sit to Stand Details (indicate cue type and reason): Used back of chair in front of pt for her to  hold onto.  Needed constant cues to stay on task.  Manual facilitation to hold RLE up to keep TDWB.  Rt. foot sliding forward on floor.  On second attempt pt leaning heavily posteriorly. Stand to Sit: 1: +2 Total assist;Patient percentage (comment) (pt = 25%) Stand to Sit Details: Pt leaning posteriorly as sat down.  Static Sitting Balance Static Sitting - Comment/# of Minutes: Pt. leaning posteriorly in chair and required mod assist to keep trunk forward from back of chair. Exercise    End of Session PT - End of Session Equipment Utilized During Treatment: Gait belt Activity Tolerance: Other (comment) (Limited by coginition.) Patient left: in chair;with call bell in reach (sitter present) Nurse Communication: Mobility status for transfers  [facility] 01/14/2011, 12:10 PM [facility] PT [phone]

## 2011-01-14 NOTE — Progress Notes (Addendum)
ANTICOAGULATION CONSULT NOTE - Follow Up Consult  Pharmacy Consult for Coumadin Indication: DVT  No Known Allergies  Patient Measurements: Height: 5' (152.4 cm) Weight: 97 lb 4.8 oz (44.135 kg) IBW/kg (Calculated) : 45.5  Adjusted Body Weight:   Vital Signs: Temp: 97.8 F (36.6 C) (12/18 0736) Temp src: Oral (12/18 0736) BP: 127/73 mmHg (12/18 0600) Pulse Rate: 84  (12/18 0700)  Labs:  Basename 01/14/11 0445 01/13/11 0615 01/12/11 1850 01/12/11 0522  HGB 8.4* 8.6* -- --  HCT 26.7* 27.5* -- 26.8*  PLT 384 455* -- 490*  APTT -- -- -- --  LABPROT 17.6* -- -- --  INR 1.42 -- -- --  HEPARINUNFRC 0.46 0.43 -- 0.45  CREATININE 0.56 0.53 0.58 --  CKTOTAL -- -- -- --  CKMB -- -- -- --  TROPONINI -- -- -- --   Estimated Creatinine Clearance: 47.5 ml/min (by C-G formula based on Cr of 0.56).   Assessment: 65yof to restart Coumadin for recent DVT (11/12). Ok to restart per Ortho. Pt was receiving 5mg  daily PTA. Amiodarone has been started this admission. Monitor INR closely for amiodarone effects.   - AST/ALT (115/297) elevated but trending down - INR 1.42 (last Coumadin dose per pt 12/4 at nursing home) - H/H stable, Plts dropped - No significant bleeding complications reported  Goal of Therapy:  INR 2-3   Plan:  1. Coumadin 5mg  po x 1 today 2. Daily PT/INR  [name] [phone] 01/14/2011,9:38 AM

## 2011-01-14 NOTE — Progress Notes (Signed)
CRITICAL VALUE ALERT  Critical value received:  Potassium 2.5   Date of notification:  01/14/11  Time of notification:  0614  Critical value read back:yes  Nurse who received alert:  Jasper Riling  MD notified (1st page):  Dr. Darrick Penna  Time of first page:  0618  MD notified (2nd page):  Time of second page:  Responding MD:  Dr. Darrick Penna  Time MD responded:  [phone]

## 2011-01-14 NOTE — Progress Notes (Signed)
Speech Pathology: Dysphagia Treatment Note  Patient was observed with : Mechanical Soft / Ground and Thin  And nectar thick liquids.  Patient was noted to have s/s of aspiration : No  Lung Sounds:  Diminished, clear Temperature: 97.8  Patient required: max verbal, visual,and tactile cues to consistently follow precautions/strategies  Clinical Impression: Patient without overt s/s of aspiration with any po trials provided to assess for diet tolerance/potential to upgrade however RN reports minimal intermittent coughing during am meal. Highly suspect decreased attention and need for max cueing to sustain attention to task impacting aspiration risk at this time. Overall, patient appears to be tolerating current diet from a respiratory standpoint. Will continue conservative diet and advance as mentation improves.   Recommendations:  Continue current diet, SLP will f/u for diet tolerance and potential for upgrade.   Pain:   none Intervention Required:   No   Goals: Progressing  Ferdinand Lango MA, CCC-SLP [phone]

## 2011-01-14 NOTE — Progress Notes (Signed)
eLink Physician-Brief Progress Note Patient Name: [name] DOB: 04-21-1945 MRN: [id number]  Date of Service  01/14/2011   HPI/Events of Note   Phos and potassium low  eICU Interventions  Phos and potassium replaced   Intervention Category Intermediate Interventions: Electrolyte abnormality - evaluation and management  Brax Walen 01/14/2011, 6:38 AM

## 2011-01-14 NOTE — Progress Notes (Signed)
Subjective: 10 Days Post-Op Procedure(s) (LRB): LEFT HEART CATHETERIZATION WITH CORONARY ANGIOGRAM (N/A)   Sitting up in chair States pain better Still appears somewhat confused  Objective: Current Vitals Blood pressure 127/73, pulse 84, temperature 97.8 F (36.6 C), temperature source Oral, resp. rate 29, height 5' (1.524 m), weight 44.135 kg (97 lb 4.8 oz), SpO2 100.00%. Vital signs in last 24 hours: Temp:  [97.3 F (36.3 C)-98.2 F (36.8 C)] 97.8 F (36.6 C) (12/18 0736) Pulse Rate:  [78-98] 84  (12/18 0700) Resp:  [19-37] 29  (12/18 0700) BP: (98-135)/(55-95) 127/73 mmHg (12/18 0600) SpO2:  [94 %-100 %] 100 % (12/18 0742) Weight:  [44.135 kg (97 lb 4.8 oz)] 97 lb 4.8 oz (44.135 kg) (12/18 0600)  Intake/Output from previous day: 12/17 0701 - 12/18 0700 In: 1137.7 [P.O.:20; I.V.:943.7; IV Piggyback:174] Out: 3070 [Urine:3070]  LABS  Basename 01/14/11 0445 01/13/11 0615 01/12/11 0522  HGB 8.4* 8.6* 8.5*    Basename 01/14/11 0445 01/13/11 0615  WBC 9.6 11.7*  RBC 3.03* 3.11*  HCT 26.7* 27.5*  PLT 384 455*    Basename 01/14/11 0445 01/13/11 0615  NA 142 144  K 2.5* 4.0  CL 105 109  CO2 26 23  BUN 11 11  CREATININE 0.56 0.53  GLUCOSE 118* 111*  CALCIUM 8.0* 8.6    Basename 01/14/11 0445  LABPT --  INR 1.42      Physical Exam  Gen: sitting in char Ext:   Right Lower Extremity   TED in place   Incisions healed and sutures ready to come out   Swelling controlled   Pt holds foot in profound equinus   Does not evert foot well at all   Can passively range ankle without difficulty   + DP pusle   Imaging Dg Chest Port 1 View  01/13/2011  *RADIOLOGY REPORT*  Clinical Data: Respiratory failure  PORTABLE CHEST - 1 VIEW  Comparison: 01/12/2011  Findings: Cardiomediastinal silhouette is stable.  Persistent right pleural effusion with right basilar atelectasis or infiltrate. Stable right arm PICC line position.  Left lung is clear.  No pulmonary edema.   IMPRESSION: Persistent right pleural effusion with right basilar atelectasis or infiltrate.  No pulmonary edema.  Original Report Authenticated By: Natasha Mead, M.D.    Assessment/Plan: 10 Days Post-Op Procedure(s) (LRB): LEFT HEART CATHETERIZATION WITH CORONARY ANGIOGRAM (N/A)  1. Subacute R distal femur fx s/p IMN   TDWB  ROM as tolerated   DSG changes PRN   TED hose   Try to limit pillows under knee   PRAFO boot R leg  F/u xrays 2. R LEx DVT  3. MI with cardioresp failure  4. Continue per CCM  5. Continue with PT/OT  Pt with significant deconditioning  Mobilize as tolerated 6. dispo  Will likely need facility as pt very weak and will need daily therapy sessions  Mearl Latin, PA-C 01/14/2011, 9:21 AM

## 2011-01-14 NOTE — Plan of Care (Signed)
Problem: Phase III Progression Outcomes Goal: Voiding independently Outcome: Completed/Met Date Met:  01/14/11 Incontinent of urine

## 2011-01-14 NOTE — Progress Notes (Signed)
eLink Physician-Brief Progress Note Patient Name: [name] DOB: [birth year] MRN: [id number]  Date of Service  01/14/2011   HPI/Events of Note   K 2.9  eICU Interventions  Replete K further    Intervention Category Major Interventions: Electrolyte abnormality - evaluation and management  Shan Levans 01/14/2011, 4:46 PM

## 2011-01-14 NOTE — Plan of Care (Signed)
Problem: Phase II Progression Outcomes Goal: Progress activities as ordered Requiring maximal assistance and lift occasionally to mobilize to get OOB to chair.

## 2011-01-14 NOTE — Progress Notes (Signed)
   CARE MANAGEMENT NOTE 01/14/2011  Patient:  [name], [name]   Account Number:  [id number]  Date Initiated:  01/02/2011  Documentation initiated by:  Junius Creamer  Subjective/Objective Assessment:   adm w femur fx     Action/Plan:   from nsg facility   Anticipated DC Date:  01/17/2011   Anticipated DC Plan:  SKILLED NURSING FACILITY  In-house referral  Clinical Social Worker      DC Planning Services  CM consult      Choice offered to / List presented to:             Status of service:  In process, will continue to follow Medicare Important Message given?   (If response is "NO", the following Medicare IM given date fields will be blank) Date Medicare IM given:   Date Additional Medicare IM given:    Discharge Disposition:  SKILLED NURSING FACILITY  Per UR Regulation:  Reviewed for med. necessity/level of care/duration of stay  Comments:  01/14/11 Kelwin Gibler,RN,[name] PT ADM FROM SNF WITH RT FEMUR FRACTURE AND DVT.  CSW FOLLOWING, WORKING WITH DAUGHTER TO FACILITATE DC TO SNF WHEN MEDICALLY STABLE FOR DISCHARGE. Phone #[phone]    01-13-11 2pm [name], [name] - [phone] UR completed - now off vent again and off bipap as of this am.  Still on IV heparin, lasix,and antibiotics.   01-07-11 11:10am [name], [name] [phone] UR Completed -- post surgery - coded - in ICU on vent on 2011-01-20 - now extubated.  12/6 sw ref. debbie dowell rn,bsn [id number]

## 2011-01-14 NOTE — Progress Notes (Signed)
@   Subjective:  Confused this AM; no sleep per sitter; denies CP or SOB   Objective:  Filed Vitals:   01/14/11 0600 01/14/11 0700 01/14/11 0736 01/14/11 0742  BP: 127/73     Pulse: 82 84    Temp:   97.8 F (36.6 C)   TempSrc:   Oral   Resp: 26 29    Height:      Weight: 97 lb 4.8 oz (44.135 kg)     SpO2: 99% 99%  100%    Intake/Output from previous day:  Intake/Output Summary (Last 24 hours) at 01/14/11 0753 Last data filed at 01/14/11 0718  Gross per 24 hour  Intake 1222.67 ml  Output   3070 ml  Net -1847.33 ml    Physical Exam: Physical exam: Skin is warm and dry.  HEENT is normal.  Neck is supple. No thyromegaly.  Chest diminished BS bases Cardiovascular exam RRR; + gallop Abdominal exam nontender or distended. No masses palpated. Extremies s/p right leg surgery with no edema Neuro moves all extremities     Lab Results: Basic Metabolic Panel:  Basename 01/14/11 0445 01/13/11 0615 01/12/11 1850  NA 142 144 --  K 2.5* 4.0 --  CL 105 109 --  CO2 26 23 --  GLUCOSE 118* 111* --  BUN 11 11 --  CREATININE 0.56 0.53 --  CALCIUM 8.0* 8.6 --  MG 1.8 -- 1.7  PHOS 1.8* -- 1.3*   CBC:  Basename 01/14/11 0445 01/13/11 0615 01/12/11 0522  WBC 9.6 11.7* --  NEUTROABS -- 8.8* 9.9*  HGB 8.4* 8.6* --  HCT 26.7* 27.5* --  MCV 88.1 88.4 --  PLT 384 455* --      Assessment/Plan:  #1 S/P MI complicated by cardiac arrest. Patient is status post bare-metal stent to the right coronary artery. Continue aspirin and Plavix. Change ASA to 81 mg daily; dc plavix after 30 days given need for coumadin. #2 ischemic cardiomyopathy-Continue ACEI and beta blocker. Continue diuresis (decrease lasix to 40 bid); watch renal function. #3 ventricular and atrial arrhythmias-change amiodarone to 200 mg daily; DC in 6-8 weeks if she maintains sinus. #4 recent DVT-continue intravenous heparin. No PE on CT scan. Begin coumadin when ok with surgery; management per primary service. #5  VDRF- resolved #6 anemia #7 pneumonia-Antibiotics per critical care medicine. #8 status post repair of leg fracture-management per orthopedic surgery. #9 hypokalemia - supplement #10 Confusion - possibly related to ICU psychosis; no focal neuro findings We will follow from a distance.   Olga Millers 01/14/2011, 7:53 AM

## 2011-01-14 NOTE — Progress Notes (Signed)
Orthopedic Tech Progress Note Patient Details:  [name] February 26, 1945 [id number]  Other Ortho Devices Type of Ortho Device: Other (comment) Ortho Device Location: applied Prafo boot to Right foot.   [name] 01/14/2011, 12:36 PM

## 2011-01-14 NOTE — Progress Notes (Signed)
Name: [name] MRN: [id number] DOB: 1945/10/01    LOS: 13  PCCM PROGRESS NOTE  History of Present Illness: 65 y/o F with PMHx of HTN suffered fall on 12/24/10 and LE pain. Initially taken to [facility] and diagnosed with R femoral DVT and started on anticoagulation. Pt had persistent pain and she had further imaging studies which showed R femoral fracture. Pt was transferred to [facility] for further management and was taken to the OR on 01/03/11 and underwent Open reduction and internal fixation of the right femur fracture. Post op suffered episode of Torsades and underwent 7-10 mins CPR. Subsequently developed AFRVR and hypotension. EKG demonstrated ST elevation on the inferior leads. Underwent cardiac cath 12/7. PCCM asked to assist with vent/CCM issues  Lines / Drains: 12/07 L IJ CVL>>> 12/14 12/07 ETT>>> 12/10, 12/11 >>> 12/14  12/14 PICC >>>  12/15 Foley>>>  Cultures: 12/10 UC>>>neg 12/10 RC>>>neg  Antibiotics: 12/10 Vancomycin >>> 12/11  12/10 Ceftazidime (fever, purulent sputum, cloudy urine) >>>  Tests / Events: 12/07 ORIF R femur fx  11/28 LE venous dopplers - Occlusive right femoropopliteal DVT  12/08 Cath - Severe 95% thrombotic lesion in the mid right coronary artery which was successfully stented with a bare metal stent, postdilated to greater than 3 mm in diameter. Severe left ventricular dysfunction with inferior apical and distal anterior hypokinesis of severe degree. The estimated ejection fraction is 25%.  12/08 CT head: No acute abnormalities  12/08 Echo: Systolic function was moderately to severely reduced. The estimated ejection fraction was in the range of 30% to 35%. There is akinesis of the apical myocardium. There is akinesis of the inferoposterior myocardium. There is hypokinesis of the lateral myocardium  12/11 Reintubated for sudden onset resp distress  12/11 CTA chest: No evidence of pulmonary embolus. Moderate bilateral effusions with significant consolidation and  atelectasis in both lungs. Perihilar infiltration suggesting edema. Indeterminate mass or fluid collection in the right supraclavicular region measuring about 3 x 2.2 cm.  12/14 Extubated  Overnight:  Seen by speech.  Diet started.  No complaints.  Vital Signs: Temp:  [97.3 F (36.3 C)-98.2 F (36.8 C)] 97.8 F (36.6 C) (12/18 0736) Pulse Rate:  [78-98] 84  (12/18 0700) Resp:  [19-37] 29  (12/18 0700) BP: (98-135)/(55-95) 127/73 mmHg (12/18 0600) SpO2:  [94 %-100 %] 100 % (12/18 0742) Weight:  [44.135 kg (97 lb 4.8 oz)] 97 lb 4.8 oz (44.135 kg) (12/18 0600) I/O last 3 completed shifts: In: 1844.3 [P.O.:20; I.V.:1350.3; IV Piggyback:474] Out: 3280 [Urine:3280]  Physical Examination: General:  Sitting in the chair, in no distress Neuro:  More oriented then yesterday, nonfocal HEENT:  PERRL Neck:  No JVD   Cardiovascular:  Regular rate, no murmurs Lungs:  Bilateral diminished air movement Abdomen:  Soft, nontender, bowel sounds present Musculoskeletal:  No LE edema Skin:  Intact   Ventilator settings:    Labs and Imaging:  Reviewed.  Please refer to the Assessment and Plan section for relevant results.  Assessment and Plan:  Ischemic cardiomyopathy, MI, status post torsades cardiac arrest, atrial fibrillation with RVR, hypertension.  Status post bare metal stent placement. Cardiology following.  Lab 01/14/11 0445 01/13/11 0615 01/12/11 1850 01/12/11 0522 01/11/11 0345  CREATININE 0.56 0.53 0.58 0.52 0.55    Lab 01/14/11 0445 01/13/11 0615 01/12/11 1850 01/12/11 0522 01/11/11 0345  NA 142 144 139 140 138    -->continue ASA, Paix (d/c after 30 days per Cardiology) -->continue Metoprolol, Lisinopril -->continue Crestor -->continue Amiodarone   (for  6-8 weeks per Cardiology) -->continue Lasix, decreased to 40 mg IV daily  Hypokalemia Lab 01/14/11 0445 01/13/11 0615 01/12/11 1850 01/12/11 0522 01/11/11 0345  K 2.5* 4.0 2.8* 2.7* 3.4*   -->replaced by eLink (30 mmol  KPhos) -->give more (40 mEq PO) -->BMP 15:00  Hypophosphatemia -->replaced -->Phos AM  Pneumonia, no afebrile with improving   Lab 01/14/11 0445 01/13/11 0615 01/12/11 0522 01/11/11 0345 01/10/11 0442  WBC 9.6 11.7* 12.8* 14.2* 14.0*   -->d/c Ceftazidime (completed 7 days) -->monitor fever / WBC -->Insentive Spirometry / out of bed -->supplemental oxygen / bronchodilators  Right Lower Extremity DVT  -->Heparin drip on board and therapeutic level is achieved  -->restart Coumadin per pharmacy (OK with ortho)  Anemia, stable   Lab 01/14/11 0445 01/13/11 0615 01/12/11 0522 01/11/11 0345 01/10/11 0442  HCT 26.7* 27.5* 26.8* 25.0* 25.7*   -->monitor Hct  Malnutrition, dysphagia.  Swallowing assessment completed. -->nutrition recommendations pending  Encephalopathy / delirium, improved -->sitter -->monitor  Best practices / Disposition -->Downgrade to telemetry (torsades) under TRH (team 8) - signed out -->d/c Foley -->full code -->DVT Px is not indicated as on therapeutic Heparin -->GI Px is not indicated -->Diet  Orlean Bradford, M.D. Pulmonary and Critical Care Medicine [facility] Cell: [phone] Pager: [phone]  01/14/2011, 9:10 AM

## 2011-01-15 LAB — CBC: WBC: 9.1 10*3/uL (ref 4.0–10.5)

## 2011-01-15 LAB — IRON AND TIBC: Iron: 41 ug/dL — ABNORMAL LOW (ref 42–135)

## 2011-01-15 LAB — HEPARIN LEVEL (UNFRACTIONATED): Heparin Unfractionated: 0.51 IU/mL (ref 0.30–0.70)

## 2011-01-15 LAB — FERRITIN: Ferritin: 1062 ng/mL — ABNORMAL HIGH (ref 10–291)

## 2011-01-15 LAB — FOLATE: Folate: 9 ng/mL

## 2011-01-15 LAB — BASIC METABOLIC PANEL
CO2: 26 mEq/L (ref 19–32)
CO2: 26 mEq/L (ref 19–32)
Calcium: 8.8 mg/dL (ref 8.4–10.5)
Chloride: 108 mEq/L (ref 96–112)
Chloride: 109 mEq/L (ref 96–112)
Glucose, Bld: 144 mg/dL — ABNORMAL HIGH (ref 70–99)
Potassium: 2.9 mEq/L — ABNORMAL LOW (ref 3.5–5.1)
Sodium: 143 mEq/L (ref 135–145)
Sodium: 143 mEq/L (ref 135–145)

## 2011-01-15 LAB — PROTIME-INR: INR: 1.33 (ref 0.00–1.49)

## 2011-01-15 LAB — RETICULOCYTES: Retic Count, Absolute: 262.5 10*3/uL — ABNORMAL HIGH (ref 19.0–186.0)

## 2011-01-15 LAB — VITAMIN B12: Vitamin B-12: >2000 pg/mL — ABNORMAL HIGH (ref 211–911)

## 2011-01-15 MED ORDER — POTASSIUM CHLORIDE CRYS ER 20 MEQ PO TBCR
20.0000 meq | EXTENDED_RELEASE_TABLET | Freq: Two times a day (BID) | ORAL | Status: DC
  Administered 2011-01-15 – 2011-01-16 (×3): 20 meq via ORAL
  Filled 2011-01-15 (×6): qty 1

## 2011-01-15 MED ORDER — LEVALBUTEROL HCL 0.63 MG/3ML IN NEBU
0.6300 mg | INHALATION_SOLUTION | Freq: Three times a day (TID) | RESPIRATORY_TRACT | Status: DC
  Administered 2011-01-16 – 2011-02-07 (×64): 0.63 mg via RESPIRATORY_TRACT
  Filled 2011-01-15 (×72): qty 3

## 2011-01-15 MED ORDER — POTASSIUM CHLORIDE CRYS ER 20 MEQ PO TBCR
40.0000 meq | EXTENDED_RELEASE_TABLET | ORAL | Status: AC
  Administered 2011-01-15 (×2): 40 meq via ORAL
  Filled 2011-01-15 (×2): qty 2

## 2011-01-15 MED ORDER — LEVALBUTEROL HCL 0.63 MG/3ML IN NEBU
0.6300 mg | INHALATION_SOLUTION | RESPIRATORY_TRACT | Status: DC | PRN
  Administered 2011-01-16: 0.63 mg via RESPIRATORY_TRACT
  Filled 2011-01-15: qty 3

## 2011-01-15 MED ORDER — WARFARIN SODIUM 5 MG PO TABS
5.0000 mg | ORAL_TABLET | Freq: Once | ORAL | Status: AC
  Administered 2011-01-15: 5 mg via ORAL
  Filled 2011-01-15: qty 1

## 2011-01-15 MED ORDER — FERROUS SULFATE 325 (65 FE) MG PO TABS
325.0000 mg | ORAL_TABLET | Freq: Two times a day (BID) | ORAL | Status: DC
  Administered 2011-01-15 – 2011-01-16 (×3): 325 mg via ORAL
  Filled 2011-01-15 (×7): qty 1

## 2011-01-15 MED ORDER — ENSURE PUDDING PO PUDG
1.0000 | Freq: Three times a day (TID) | ORAL | Status: DC
  Administered 2011-01-15 – 2011-01-16 (×3): 1 via ORAL

## 2011-01-15 NOTE — Progress Notes (Signed)
Occupational Therapy Evaluation Patient Details Name: [name] MRN: [id number] DOB: 1945-06-01 Today's Date: 01/15/2011  Problem List:  Patient Active Problem List  Diagnoses  . HTN (hypertension)  . Femoral DVT (deep venous thrombosis)  . Anemia  . Hyponatremia  . Vitamin B12 deficiency (dietary) anemia  . Lupus anticoagulant positive  . Femur fracture, right  . Osteoporosis  . Cardiogenic shock  . MI (myocardial infarction)    Past Medical History:  Past Medical History  Diagnosis Date  . Hypertension   . 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 Vitamin B12 deficiency (dietary) anemia 12/27/2010  . Arthritis   . MI (myocardial infarction) 01/09/2011   Past Surgical History:  Past Surgical History  Procedure Date  . Abdominal hysterectomy     06/2010  . Femur im nail 01/03/2011    Procedure: INTRAMEDULLARY (IM) RETROGRADE FEMORAL NAILING;  Surgeon: Budd Palmer;  Location: [facility] OR;  Service: Orthopedics;  Laterality: Right;  Right IM Retrograde Femoral Nailing    OT Assessment/Plan/Recommendation OT Assessment Clinical Impression Statement: Pt. will benefit from OT to increase functional independence and get pt. to min assist level at D/C to next venue of care/ OT Recommendation/Assessment: Patient will need skilled OT in the acute care venue OT Problem List: Decreased strength;Decreased activity tolerance;Impaired balance (sitting and/or standing);Decreased cognition;Decreased safety awareness;Decreased knowledge of use of DME or AE;Decreased knowledge of precautions Barriers to Discharge: Decreased caregiver support OT Therapy Diagnosis : Generalized weakness;Acute pain;Cognitive deficits OT Plan OT Frequency: Min 1X/week OT Treatment/Interventions: Self-care/ADL training;Patient/family education;Balance training;Therapeutic activities OT Recommendation Follow Up Recommendations: Skilled nursing facility Equipment Recommended: Defer to next  venue Individuals Consulted Consulted and Agree with Results and Recommendations: Patient OT Goals Acute Rehab OT Goals OT Goal Formulation: With patient Time For Goal Achievement: 2 weeks ADL Goals Pt Will Perform Grooming: with set-up;with min assist;Standing at sink ADL Goal: Grooming - Progress: Progressing toward goals Pt Will Perform Upper Body Bathing: with set-up;Unsupported;Sitting, edge of bed ADL Goal: Upper Body Bathing - Progress: Not met Pt Will Perform Upper Body Dressing: with min assist;Sitting, bed;Unsupported ADL Goal: Upper Body Dressing - Progress: Progressing toward goals Pt Will Transfer to Toilet: with min assist;Stand pivot transfer;3-in-1 ADL Goal: Toilet Transfer - Progress: Progressing toward goals Additional ADL Goal #1: Pt. will follow 1-step commands 50% of 2 ADL tasks. ADL Goal: Additional Goal #1 - Progress: Progressing toward goals  OT Evaluation Precautions/Restrictions  Precautions Precautions: Fall Required Braces or Orthoses: No Restrictions Weight Bearing Restrictions: No RLE Weight Bearing: Touchdown weight bearing Other Position/Activity Restrictions: Right LE ROM without limitations. Prior Functioning Home Living Lives With: Alone Receives Help From: Family Type of Home: House Home Layout: Two level;Able to live on main level with bedroom/bathroom Alternate Level Stairs-Rails: Right Alternate Level Stairs-Number of Steps: full flight but doesn't have to go up stairs Home Access: Stairs to enter Entrance Stairs-Rails: Right Entrance Stairs-Number of Steps: 2 Home Adaptive Equipment: None Additional Comments: Pt will need ST-SNF at dc Prior Function Level of Independence: Independent with basic ADLs;Independent with homemaking with ambulation;Independent with gait;Independent with transfers Able to Take Stairs?: Yes Driving: Yes Vocation: Part time employment Comments: PLOF refers to before original fall and admit on 12/27/10. Pt.  was recently at [facility].  ADL ADL Eating/Feeding: Simulated;Set up Where Assessed - Eating/Feeding: Chair Grooming: Performed;Wash/dry face;Minimal assistance Grooming Details (indicate cue type and reason): assist for thoroughness Where Assessed - Grooming: Sitting, chair Upper Body Bathing: Simulated;Chest;Right arm;Left arm;Abdomen;Maximal assistance Where Assessed - Upper  Body Bathing: Sitting, chair Lower Body Bathing: Simulated;+1 Total assistance Where Assessed - Lower Body Bathing: Sit to stand from chair Upper Body Dressing: Performed;Moderate assistance Upper Body Dressing Details (indicate cue type and reason): with donning gown Where Assessed - Upper Body Dressing: Sitting, bed Lower Body Dressing: Simulated;+1 Total assistance Where Assessed - Lower Body Dressing: Sit to stand from bed Toilet Transfer: Performed;+1 Total assistance;Comment for patient % (pt=>20%) Toilet Transfer Details (indicate cue type and reason): Max verbal cues for TDWB and hand placement for safe transfer technique.  Toilet Transfer Method: Ambulance person: Set designer - Clothing Manipulation: Performed;Maximal assistance Toileting - Clothing Manipulation Details (indicate cue type and reason): With moving gown Where Assessed - Toileting Clothing Manipulation: Lean right and/or left Toileting - Hygiene: Performed;+1 Total assistance Toileting - Hygiene Details (indicate cue type and reason): Pt. with bilateral UE use of arm rests to keep sitting upright due to posterior lean Where Assessed - Toileting Hygiene: Sit on 3-in-1 or toilet Tub/Shower Transfer: Not assessed Tub/Shower Transfer Method: Not assessed ADL Comments: Pt. incontinent of urine upon arrival and requesting to go to the bathroom. Pt. provided assist to bedside commode with max verbal cues for forward weightshift due to posterior lean and pt. with increased confusion throughout session requiring  constant redirection of attention to task. Vision/Perception  Vision - History Baseline Vision: No visual deficits Patient Visual Report: No change from baseline Vision - Assessment Eye Alignment: Within Functional Limits Vision Assessment: Vision not tested Cognition Cognition Arousal/Alertness: Awake/alert Overall Cognitive Status: Impaired Attention: Impaired Current Attention Level: Focused Attention - Other Comments: Pt unable to stay on task during therapy. Orientation Level: Oriented to person;Disoriented to place;Disoriented to time;Disoriented to situation Following Commands: Follows one step commands inconsistently Sensation/Coordination Coordination Gross Motor Movements are Fluid and Coordinated: Yes Fine Motor Movements are Fluid and Coordinated: Yes Extremity Assessment RUE Assessment RUE Assessment: Within Functional Limits LUE Assessment LUE Assessment: Within Functional Limits (deformity of Left hand due to pt. with history of polio.) Mobility  Bed Mobility Bed Mobility: Yes Sit to Supine - Right: 1: +1 Total assist;HOB flat;With rail;Patient percentage (comment) (pt=<20%) Transfers Transfers: Yes Sit to Stand: 1: +2 Total assist;Patient percentage (comment) (pt=20%) Sit to Stand Details (indicate cue type and reason): assist for Rt. LE support to maintain TDWB and max verbal cues and manual facilitation for upright position and hand placement on arm rests Exercises   End of Session OT - End of Session Equipment Utilized During Treatment: Gait belt Activity Tolerance: Patient tolerated treatment well Patient left: in bed;with call bell in reach;Other (comment) Psychiatrist present) Nurse Communication: Mobility status for transfers General Behavior During Session: [facility] for tasks performed Cognition: Impaired Cognitive Impairment: Pt. unable to follow 1-step commands consistently and difficult to maintain attention to task.   Benen Weida, oTR/L Pager  [phone] 01/15/2011, 3:25 PM

## 2011-01-15 NOTE — Progress Notes (Signed)
Speech Pathology: Dysphagia Treatment Note  Patient was observed with : Mechanical Soft / Ground and Nectar liquids.  Patient was noted to have s/s of aspiration : No:  (see below)  Lung Sounds:  Rhonchi and crackles bilaterally Temperature: 98.4  Patient required: max verbal, tactile cues to consistently follow precautions/strategies  Clinical Impression: Patient remains confused, impulsive with po intake requiring max SLP cueing to initially initiate po intake as well as decrease impulsivity with self feeding. Patient without overt s/s of aspiration noted however increased congested upper airway noise and cough noted today throughout treatment. Per RN, patient with improved cough, expectorating moderately thick tan secretions however RN has not observed any difficulty with pos. Sitter present in room. Education complete regarding current diet recommendations and aspiration precautions; verbalized understanding.   Recommendations:  Given continued AMS and change in lung sounds, will continue conservative diet and f/u closely. Pending any further worsening in lung sounds, patient may benefit from objective evaluation of swallow.   Pain:   none Intervention Required:   No   Goals: Progressing  Traci Lango MA, CCC-SLP [phone]

## 2011-01-15 NOTE — Progress Notes (Signed)
Nutrition Follow-up  Diet Order:  Dysphagia 3, Nectar thick liquids  S/p bedside swallow evaluation 12/17. PO intake variable at 20-60% per flowsheet records.  Estimated Nutrition Needs: 1,200-1,400 kcals, 65-75 gm protein  Meds: Scheduled Meds:   . amiodarone  200 mg Oral Daily  . antiseptic oral rinse  15 mL Mouth Rinse q12n4p  . aspirin  81 mg Oral Daily  . clopidogrel  75 mg Oral Q breakfast  . cyanocobalamin  1,000 mcg Intramuscular Weekly  . furosemide  40 mg Intravenous BID  . levalbuterol  0.63 mg Nebulization Q6H  . lisinopril  5 mg Oral Daily  . metoprolol tartrate  12.5 mg Oral BID  . potassium chloride  40 mEq Oral Once  . potassium chloride  40 mEq Oral Once  . potassium chloride  40 mEq Oral Once  . potassium chloride  40 mEq Oral Q1 Hr x 2  . potassium phosphate IVPB (mmol)  30 mmol Intravenous Once  . rosuvastatin  10 mg Oral Daily  . sodium chloride  10 mL Intracatheter Q12H  . warfarin  5 mg Oral ONCE-1800  . DISCONTD: cefTAZidime (FORTAZ)  IV  1 g Intravenous Q8H  . DISCONTD: chlorhexidine  15 mL Mouth Rinse BID   Continuous Infusions:   . sodium chloride 20 mL/hr at 01/15/11 0800  . heparin 10.5 mL/hr (01/15/11 0800)   PRN Meds:.sodium chloride, acetaminophen, bisacodyl, food thickener, levalbuterol, magnesium hydroxide, ondansetron (ZOFRAN) IV, phenol, sodium chloride  Labs:  CMP     Component Value Date/Time   NA 143 01/15/2011 0410   K 2.9* 01/15/2011 0410   CL 108 01/15/2011 0410   CO2 26 01/15/2011 0410   GLUCOSE 144* 01/15/2011 0410   BUN 11 01/15/2011 0410   CREATININE 0.55 01/15/2011 0410   CALCIUM 8.5 01/15/2011 0410   PROT 6.8 01/13/2011 0615   ALBUMIN 2.5* 01/13/2011 0615   AST 115* 01/13/2011 0615   ALT 297* 01/13/2011 0615   ALKPHOS 120* 01/13/2011 0615   BILITOT 1.2 01/13/2011 0615   GFRNONAA >90 01/15/2011 0410   GFRAA >90 01/15/2011 0410     Intake/Output Summary (Last 24 hours) at 01/15/11 0832 Last data filed at  01/15/11 0800  Gross per 24 hour  Intake   1420 ml  Output    850 ml  Net    570 ml    Weight Status:  44.1 kg (12/18) -- down likely due to fluid loss (on Lasix)  Nutrition Dx:  Inadequate Oral Intake, improved  Goal:  Meet >90% of estimated nutrition needs with PO diet and added supplement, currently unmet Monitor: PO intake, labs, weight, I/O's  Intervention:   Add Ensure Pudding PO TID (170 kcals, 4 gm protein per 4 oz cup)  RD to follow for nutrition care plan   Alger Memos Pager #:  [phone]

## 2011-01-15 NOTE — Progress Notes (Signed)
Patient admitted from [facility]- will discuss plans with the Physician tomorrow for further d/c planning. Reece Levy, MSW, Theresia Majors [phone]

## 2011-01-15 NOTE — Progress Notes (Signed)
ANTICOAGULATION CONSULT NOTE - Follow Up Consult  Pharmacy Consult for Heparin and Coumadin Indication: DVT  No Known Allergies  Patient Measurements: Height: 5' (152.4 cm) Weight: 97 lb 4.8 oz (44.135 kg) IBW/kg (Calculated) : 45.5  Adjusted Body Weight:   Vital Signs: Temp: 98.4 F (36.9 C) (12/19 0740) Temp src: Oral (12/19 0740) BP: 139/73 mmHg (12/19 0800) Pulse Rate: 92  (12/19 0800)  Labs:  Basename 01/15/11 1000 01/15/11 0410 01/14/11 1500 01/14/11 0445 01/13/11 0615  HGB -- 8.6* -- 8.4* --  HCT -- 28.3* -- 26.7* 27.5*  PLT -- 326 -- 384 455*  APTT -- -- -- -- --  LABPROT -- 16.7* -- 17.6* --  INR -- 1.33 -- 1.42 --  HEPARINUNFRC 0.51 1.13* -- 0.46 --  CREATININE -- 0.55 0.61 0.56 --  CKTOTAL -- -- -- -- --  CKMB -- -- -- -- --  TROPONINI -- -- -- -- --   Estimated Creatinine Clearance: 47.5 ml/min (by C-G formula based on Cr of 0.55).   Medications:  Heparin 1050 units/hr  Assessment: 65yof on Heparin bridging to Coumadin for recent DVT (11/12). Heparin level initially drawn incorrectly. Recheck heparin level (0.51) is therapeutic. INR (1.33) is subtherapeutic after Coumadin was restarted yesterday (12/18).   Goal of Therapy:  INR 2-3 Heparin level 0.3-0.7 units/ml   Plan:  1. Continue heparin drip 1050 units/hr (10.5 ml/hr) 2. Coumadin 5mg  po x1 3. Follow-up AM heparin level and INR  [name] [phone] 01/15/2011,11:21 AM

## 2011-01-15 NOTE — Progress Notes (Signed)
Discussed in the long length of stay meeting [name] 01/15/2011  

## 2011-01-15 NOTE — Progress Notes (Signed)
Patient discussed in the Long LOS meeting today- patient is s/p MI and femur fx. Probable d/c needs for SNF. CSW to follow up with patient and family for full assessment, support and further d/c planning. Reece Levy, MSW, Theresia Majors [phone]

## 2011-01-15 NOTE — Progress Notes (Signed)
Patient brought via bed, cardiac monitor and this RN to [id number]. Belongings brought to bedside with the patient. Patient placed on telemetry box and verified she is on 4700 unit monitor with NSMT. Tolerated transfer process well.

## 2011-01-15 NOTE — Progress Notes (Signed)
65 y/o F with PMHx of HTN suffered fall on 12/24/10 and LE pain. Initially taken to [facility] and diagnosed with R femoral DVT and started on anticoagulation. Pt had persistent pain and she had further imaging studies which showed R femoral fracture. Pt was transferred to [facility] for further management and was taken to the OR on 01/03/11 and underwent Open reduction and internal fixation of the right femur fracture. Post op suffered episode of Torsades and underwent 7-10 mins CPR. Subsequently developed AFRVR and hypotension. EKG demonstrated ST elevation on the inferior leads. Underwent cardiac cath 12/7. PCCM asked to assist with vent/CCM issues. Patient care transfer to [facility] 12-19.     Subjective: Awake , following commands. Still confused.  Denies chest pain or dyspnea.  Objective: Filed Vitals:   01/15/11 0400 01/15/11 0740 01/15/11 0746 01/15/11 0800  BP: 128/74   139/73  Pulse: 81   92  Temp:  98.4 F (36.9 C)    TempSrc:  Oral    Resp: 31   28  Height:      Weight:      SpO2: 96%  99% 99%   Weight change:   Intake/Output Summary (Last 24 hours) at 01/15/11 0924 Last data filed at 01/15/11 0800  Gross per 24 hour  Intake 1314.5 ml  Output    750 ml  Net  564.5 ml    General: Alert, awake, oriented x3, in no acute distress.  HEENT: No bruits, no goiter.  Heart: Regular rate and rhythm, without murmurs, rubs, gallops.  Lungs: Crackles bilateral. ronchus bilateralbilateral air movement.  Abdomen: Soft, nontender, nondistended, positive bowel sounds.  Neuro: Grossly intact, nonfocal. Extremities. No edema, boot bl.   Lab Results:  Basename 01/15/11 0410 01/14/11 1500 01/14/11 0445 01/12/11 1850  NA 143 142 -- --  K 2.9* 2.9* -- --  CL 108 105 -- --  CO2 26 25 -- --  GLUCOSE 144* 133* -- --  BUN 11 12 -- --  CREATININE 0.55 0.61 -- --  CALCIUM 8.5 8.2* -- --  MG -- -- 1.8 1.7  PHOS -- -- 1.8* 1.3*    Basename 01/13/11 0615  AST 115*  ALT 297*  ALKPHOS 120*  BILITOT  1.2  PROT 6.8  ALBUMIN 2.5*   Basename 01/15/11 0410 01/14/11 0445 01/13/11 0615  WBC 9.1 9.6 --  NEUTROABS -- -- 8.8*  HGB 8.6* 8.4* --  HCT 28.3* 26.7* --  MCV 89.0 88.1 --  PLT 326 384 --    Micro Results: Recent Results (from the past 240 hour(s))  CULTURE, RESPIRATORY     Status: Normal   Collection Time   01/06/11 12:05 PM      Component Value Range Status Comment   Specimen Description ENDOTRACHEAL ASPIRATE   Final    Special Requests NONE   Final    Gram Stain     Final    Value: FEW WBC PRESENT,BOTH PMN AND MONONUCLEAR     NO SQUAMOUS EPITHELIAL CELLS SEEN     NO ORGANISMS SEEN   Culture Non-Pathogenic Oropharyngeal-type Flora Isolated.   Final    Report Status 01/09/2011 FINAL   Final     Studies/Results: Dg Knee Right Port  01/14/2011  *RADIOLOGY REPORT*  Clinical Data: History of surgery 11 days previously.  Mild right knee pain.  PORTABLE RIGHT KNEE - 1-2 VIEW  Comparison: 01/03/2011.  Findings: Intramedullary rod remains in place within the femur with distal transverse screws stabilizing oblique fracture of distal third of  the diaphysis of the femur. There is near apposition of major fracture fragments with near anatomic alignment.  The small triangular fracture fragment next to the posterior medial aspect of the diaphysis is not significantly changed. No dislocation or disruption of hardware is seen.  No new lesion is evident.  IMPRESSION: Postop appearance as described above.  No disruption of hardware or new lesion evident.  Original Report Authenticated By: Crawford Givens, M.D.    Medications: I have reviewed the patient's current medications.   Patient Active Hospital Problem List:  Femur fracture, right (01/01/2011)  Patient will need SNF. Continue with daily PT.    HTN (hypertension) (12/24/2010)  Continue with lisinopril, metoprolol and lasix.   Femoral DVT (deep venous thrombosis) (12/25/2010) Continue with heparin Gtt, INR not at goal. Continue with  coumadin.    Hyponatremia (12/26/2010) Resolved.   Vitamin B12 deficiency (dietary) anemia (12/27/2010) B12 weekly. Monitor Hb on multiple anticoagulation.  I will start ferrous sulfate.  I will check Anemia panel.  Osteoporosis (01/02/2011)   Cardiogenic shock (01/06/2011)  MI (myocardial infarction) (01/09/2011) continue ASA, Paix (d/c after 30 days per Cardiology)  continue Metoprolol, Lisinopril  continue Crestor  continue Amiodarone (for 6-8 weeks per Cardiology)  continue Lasix, 40 mg BID.   Hypokalemia: Will repeat B-met today. I will add schedule KCL supplement.    Pneumonia,  Ceftazidime (completed 7 days)  monitor fever / WBC  Insentive Spirometry / out of bed  supplemental oxygen / bronchodilators Encephalopathy / delirium, improved  sitter  monitor Hypophosphatemia  replaced  Phos AM 12-20. Dispo: When INR therapeutic patient might be able to go to SNF.    LOS: 14 days   Kallan Merrick M.D.  [facility] 01/15/2011, 9:24 AM

## 2011-01-15 NOTE — Progress Notes (Signed)
eLink Physician-Brief Progress Note Patient Name: [name] DOB: 01-18-1946 MRN: [id number]  Date of Service  01/15/2011   HPI/Events of Note  hypokalemia   eICU Interventions  Potassium replaced   Intervention Category Intermediate Interventions: Electrolyte abnormality - evaluation and management  DETERDING,ELIZABETH 01/15/2011, 5:37 AM

## 2011-01-16 ENCOUNTER — Encounter (HOSPITAL_COMMUNITY): Payer: Self-pay | Admitting: Speech Pathology

## 2011-01-16 ENCOUNTER — Inpatient Hospital Stay (HOSPITAL_COMMUNITY): Payer: Medicare Other

## 2011-01-16 LAB — URINALYSIS, ROUTINE W REFLEX MICROSCOPIC
Bilirubin Urine: NEGATIVE
Glucose, UA: NEGATIVE mg/dL
Glucose, UA: NEGATIVE mg/dL
Hgb urine dipstick: NEGATIVE
Ketones, ur: NEGATIVE mg/dL
Ketones, ur: NEGATIVE mg/dL
Leukocytes, UA: NEGATIVE
Nitrite: NEGATIVE
Protein, ur: NEGATIVE mg/dL
Protein, ur: 100 mg/dL — AB
Specific Gravity, Urine: 1.011 (ref 1.005–1.030)
Urobilinogen, UA: 4 mg/dL — ABNORMAL HIGH (ref 0.0–1.0)
pH: 7.5 (ref 5.0–8.0)

## 2011-01-16 LAB — CBC
HCT: 34.9 % — ABNORMAL LOW (ref 36.0–46.0)
Hemoglobin: 10.5 g/dL — ABNORMAL LOW (ref 12.0–15.0)
MCH: 27.6 pg (ref 26.0–34.0)
MCHC: 30.1 g/dL (ref 30.0–36.0)
MCV: 91.6 fL (ref 78.0–100.0)
Platelets: 367 10*3/uL (ref 150–400)
RBC: 3.81 MIL/uL — ABNORMAL LOW (ref 3.87–5.11)
RDW: 20.8 % — ABNORMAL HIGH (ref 11.5–15.5)
WBC: 14.7 10*3/uL — ABNORMAL HIGH (ref 4.0–10.5)

## 2011-01-16 LAB — URINE MICROSCOPIC-ADD ON

## 2011-01-16 LAB — BLOOD GAS, ARTERIAL
Acid-Base Excess: 0.4 mmol/L (ref 0.0–2.0)
Bicarbonate: 23.2 mEq/L (ref 20.0–24.0)
O2 Saturation: 94.8 %
Patient temperature: 99.2
TCO2: 24.1 mmol/L (ref 0–100)
pCO2 arterial: 30.2 mmHg — ABNORMAL LOW (ref 35.0–45.0)
pH, Arterial: 7.5 — ABNORMAL HIGH (ref 7.350–7.400)
pO2, Arterial: 70.3 mmHg — ABNORMAL LOW (ref 80.0–100.0)

## 2011-01-16 LAB — PROTIME-INR: Prothrombin Time: 16 seconds — ABNORMAL HIGH (ref 11.6–15.2)

## 2011-01-16 LAB — HEPARIN LEVEL (UNFRACTIONATED): Heparin Unfractionated: 0.92 IU/mL — ABNORMAL HIGH (ref 0.30–0.70)

## 2011-01-16 MED ORDER — LORAZEPAM 2 MG/ML IJ SOLN
0.5000 mg | Freq: Once | INTRAMUSCULAR | Status: AC
  Administered 2011-01-16 – 2011-01-17 (×2): 0.5 mg via INTRAVENOUS
  Filled 2011-01-16: qty 1

## 2011-01-16 MED ORDER — ALBUTEROL SULFATE (5 MG/ML) 0.5% IN NEBU: 2.5000 mg | INHALATION_SOLUTION | Freq: Once | RESPIRATORY_TRACT | Status: DC

## 2011-01-16 MED ORDER — SODIUM CHLORIDE 0.9 % IJ SOLN
INTRAMUSCULAR | Status: AC
  Filled 2011-01-16: qty 10

## 2011-01-16 MED ORDER — HEPARIN SOD (PORCINE) IN D5W 100 UNIT/ML IV SOLN
1000.0000 [IU]/h | INTRAVENOUS | Status: DC
  Administered 2011-01-16 – 2011-01-18 (×2): 900 [IU]/h via INTRAVENOUS
  Administered 2011-01-19: 1000 [IU]/h via INTRAVENOUS
  Filled 2011-01-16 (×3): qty 250

## 2011-01-16 MED ORDER — LORAZEPAM 2 MG/ML IJ SOLN
INTRAMUSCULAR | Status: AC
  Administered 2011-01-17: 0.5 mg via INTRAVENOUS
  Filled 2011-01-16: qty 1

## 2011-01-16 MED ORDER — WARFARIN SODIUM 7.5 MG PO TABS
7.5000 mg | ORAL_TABLET | Freq: Once | ORAL | Status: DC
  Filled 2011-01-16: qty 1

## 2011-01-16 NOTE — Progress Notes (Signed)
ANTICOAGULATION CONSULT NOTE - Follow Up Consult  Pharmacy Consult for Heparin and Coumadin Indication: DVT  No Known Allergies  Vital Signs: Temp: 98.6 F (37 C) (12/20 0300) Temp src: Oral (12/20 0300) BP: 126/76 mmHg (12/20 0300) Pulse Rate: 83  (12/20 0300)  Labs:  Basename 01/16/11 0655 01/15/11 1439 01/15/11 1000 01/15/11 0410 01/14/11 1500 01/14/11 0445  HGB -- -- -- 8.6* -- 8.4*  HCT -- -- -- 28.3* -- 26.7*  PLT -- -- -- 326 -- 384  APTT -- -- -- -- -- --  LABPROT 16.0* -- -- 16.7* -- 17.6*  INR 1.25 -- -- 1.33 -- 1.42  HEPARINUNFRC 0.92* -- 0.51 1.13* -- --  CREATININE -- 0.53 -- 0.55 0.61 --  CKTOTAL -- -- -- -- -- --  CKMB -- -- -- -- -- --  TROPONINI -- -- -- -- -- --   Estimated Creatinine Clearance: 47.9 ml/min (by C-G formula based on Cr of 0.53).   Medications:  Heparin 1050 units/hr  Assessment: 65yof on Heparin bridging to Coumadin for recent DVT (11/12). Heparin is supratherapeutic this AM. I went into the patient's room and confirmed that it was drawn on the opposite arm so it was drawn correctly. INR this AM is 1.25 for some reason. Doses were charted the past 2 days.   Goal of Therapy:  INR 2-3 Heparin level 0.3-0.7 units/ml   Plan:  1. Decrease heparin to 950 units/hr 2. Coumadin 7.5mg  po x1 3. Recheck heparin level in 8 hr 3. Follow-up AM heparin level and INR  Ulyses Southward [place] [phone] 01/16/2011,8:28 AM

## 2011-01-16 NOTE — Progress Notes (Signed)
Heparin level now back therapeutic at 0.7 (goal 0.3-0.7).  Since level is at upper end of therapeutic range, will decrease rate down slightly to 900 units/hr.  Will f/u with AM labs and adjust dose as needed.

## 2011-01-16 NOTE — Progress Notes (Signed)
65 y/o F with PMHx of HTN suffered fall on 12/24/10 and LE pain. Initially taken to [facility] and diagnosed with R femoral DVT and started on anticoagulation. Pt had persistent pain and she had further imaging studies which showed R femoral fracture. Pt was transferred to [facility] for further management and was taken to the OR on 01/03/11 and underwent Open reduction and internal fixation of the right femur fracture. Post op suffered episode of Torsades and underwent 7-10 mins CPR. Subsequently developed AFRVR and hypotension. EKG demonstrated ST elevation on the inferior leads. Underwent cardiac cath 12/7. PCCM asked to assist with vent/CCM issues. Patient care transfer to [facility] 12-19.     Subjective: Patient notice to have labore breathing. Notice after eating. Patient relates dyspnea comes and go.  Objective: Filed Vitals:   01/15/11 1945 01/15/11 2237 01/16/11 0300 01/16/11 1402  BP:  125/79 126/76 143/79  Pulse:  96 83 103  Temp: 97 F (36.1 C) 97.7 F (36.5 C) 98.6 F (37 C) 98.1 F (36.7 C)  TempSrc: Axillary Oral Oral Oral  Resp:  22 22 20   Height:      Weight:  44 kg (97 lb) 44.5 kg (98 lb 1.7 oz)   SpO2:  99% 98% 96%   Weight change:   Intake/Output Summary (Last 24 hours) at 01/16/11 1758 Last data filed at 01/16/11 1340  Gross per 24 hour  Intake  825.5 ml  Output    475 ml  Net  350.5 ml    General: Alert, awake, , Ussing accessory muscle to breath.  HEENT: No bruits, no goiter.  Heart: Regular rate and rhythm, without murmurs, rubs, gallops. JVD positive.  Lungs: Crackles bl, ronchus bl , bilateral air movement.  Abdomen: Soft, nontender, nondistended, positive bowel sounds.  Neuro: Grossly intact, nonfocal.   Lab Results:  Basename 01/16/11 0655 01/15/11 1439 01/15/11 0410 01/14/11 0445  NA -- 143 143 --  K -- 4.1 2.9* --  CL -- 109 108 --  CO2 -- 26 26 --  GLUCOSE -- 129* 144* --  BUN -- 10 11 --  CREATININE -- 0.53 0.55 --  CALCIUM -- 8.8 8.5 --  MG -- -- --  1.8  PHOS 3.4 -- -- 1.8*    Basename 01/15/11 0410 01/14/11 0445  WBC 9.1 9.6  NEUTROABS -- --  HGB 8.6* 8.4*  HCT 28.3* 26.7*  MCV 89.0 88.1  PLT 326 384    Basename 01/15/11 1130  VITAMINB12 >2000*  FOLATE 9.0  FERRITIN 1062*  TIBC 170*  IRON 41*  RETICCTPCT 7.5*    Micro Results: No results found for this or any previous visit (from the past 240 hour(s)).  Studies/Results: No results found.  Medications: I have reviewed the patient's current medications.  Respiratory failure:  Question aspiration , I will check stat chest xray. Will give does of lasix now depending on x ray result. I will trasnfer to step Down unit. Oxygen sat at 97 on room air. NPO for now. Check CBC. Will give nebulizer treatment. Incentive spirometry. Will give her lasix dose now.   Femur fracture, right (01/01/2011) Patient will need SNF.  Continue with daily PT.   HTN (hypertension) (12/24/2010) Continue with lisinopril, metoprolol and lasix.   Femoral DVT (deep venous thrombosis) (12/25/2010) Continue with heparin Gtt, INR not at goal.  Continue with coumadin.   Hyponatremia (12/26/2010) Resolved.   Vitamin B12 deficiency (dietary) anemia (12/27/2010) B12 weekly.  Monitor Hb on multiple anticoagulation.  I will start ferrous  sulfate.  I will check HB now.  Osteoporosis (01/02/2011) Anemia panel consistent with anemia chronic diseases.    MI (myocardial infarction) (01/09/2011)  Cardiogenic shock (01/06/2011) continue ASA, Paix (d/c after 30 days per Cardiology)  continue Metoprolol, Lisinopril  continue Crestor  continue Amiodarone (for 6-8 weeks per Cardiology)  continue Lasix, 40 mg BID.   Hypokalemia: K at 4.1. Continue with kcl BID.  Pneumonia,  Ceftazidime (completed 7 days)  monitor fever / WBC  Insentive Spirometry / out of bed  supplemental oxygen / bronchodilators  Encephalopathy / delirium, improved  sitter  monitor  Hypophosphatemia  replaced  Phos AM 12-20.  Normal.     LOS: 15 days   Saarah Dewing M.D.  [facility] 01/16/2011, 5:58 PM

## 2011-01-16 NOTE — Progress Notes (Signed)
01/16/11 1741 Nursing Note: Labored breathing noted on observation. Pt repositioned and instructed to cough and deep breathe by nurse. Pt has slight wheezes audible to upper airways. Pt's oxygen saturation 98% and heart rate 104.Pt received a breathing treatment only one hour prior to event. Respiratory also called to evaluate pt status. Respiratory Therapist and nurse agreed pt has slight wheezes in upper airways.  MD made aware orders received to obtain portable chest xray and MD stated "I will come to visit and reevaluate pt. ". Order entered and xray called. Pt stable at this time will continue to monitor pt. Eliyohu Class Scientist, clinical (histocompatibility and immunogenetics).

## 2011-01-16 NOTE — Progress Notes (Signed)
Speech Language Pathology Treatment Note  Subjective:  Asleep in bed but arousable  Objective:  Trials of both thin and nectar-thickened liquids revealed similar post-swallow dry cough, however patient has a baseline cough with difficulty expectorating these audible thick secretions orally.  Max verbal cues to cough with effort and attempt to remove secretions, however without success.  Assessment:  Demonstrates continued respiratory issues and a compromised expectoration and cough ability.  SLP's impression is that patient may have a had an aspiration event with trial today and her coughing thereafter was concerning.  Given her confused state heightened due to lack of sleep and difficulty clearing secretions, will re-assess tomorrow for potential liquid upgrade.  Recommendations:  1. Continue Dys.3/Nectar-thick liquids  Pain:   none Intervention Required:   No  Goals: Progressing  Myra Rude, M.S.,CCC-SLP Pager [phone]

## 2011-01-17 ENCOUNTER — Inpatient Hospital Stay (HOSPITAL_COMMUNITY): Payer: Medicare Other

## 2011-01-17 ENCOUNTER — Encounter (HOSPITAL_COMMUNITY): Payer: Medicare Other

## 2011-01-17 ENCOUNTER — Encounter (HOSPITAL_COMMUNITY): Payer: Self-pay | Admitting: Pulmonary Disease

## 2011-01-17 DIAGNOSIS — R579 Shock, unspecified: Secondary | ICD-10-CM

## 2011-01-17 DIAGNOSIS — J9690 Respiratory failure, unspecified, unspecified whether with hypoxia or hypercapnia: Secondary | ICD-10-CM

## 2011-01-17 DIAGNOSIS — J690 Pneumonitis due to inhalation of food and vomit: Secondary | ICD-10-CM

## 2011-01-17 DIAGNOSIS — Z9911 Dependence on respirator [ventilator] status: Secondary | ICD-10-CM

## 2011-01-17 DIAGNOSIS — J9600 Acute respiratory failure, unspecified whether with hypoxia or hypercapnia: Secondary | ICD-10-CM

## 2011-01-17 DIAGNOSIS — I469 Cardiac arrest, cause unspecified: Secondary | ICD-10-CM

## 2011-01-17 HISTORY — DX: Pneumonitis due to inhalation of food and vomit: J69.0

## 2011-01-17 LAB — CBC
HCT: 26.1 % — ABNORMAL LOW (ref 36.0–46.0)
MCHC: 30.7 g/dL (ref 30.0–36.0)
MCV: 91.3 fL (ref 78.0–100.0)
RDW: 20.5 % — ABNORMAL HIGH (ref 11.5–15.5)

## 2011-01-17 LAB — BASIC METABOLIC PANEL
BUN: 12 mg/dL (ref 6–23)
Creatinine, Ser: 0.68 mg/dL (ref 0.50–1.10)
GFR calc Af Amer: >90 mL/min (ref >90)
GFR calc non Af Amer: 89 mL/min — ABNORMAL LOW (ref >90)

## 2011-01-17 LAB — POCT I-STAT 3, ART BLOOD GAS (G3+): Acid-Base Excess: 2 mmol/L (ref 0.0–2.0)

## 2011-01-17 LAB — CARDIAC PANEL(CRET KIN+CKTOT+MB+TROPI)
Relative Index: INVALID (ref 0.0–2.5)
Troponin I: <0.3 ng/mL (ref <0.30)

## 2011-01-17 LAB — MRSA PCR SCREENING: MRSA by PCR: NEGATIVE

## 2011-01-17 LAB — HEPARIN LEVEL (UNFRACTIONATED): Heparin Unfractionated: 0.42 IU/mL (ref 0.30–0.70)

## 2011-01-17 MED ORDER — SODIUM CHLORIDE 0.9 % IV SOLN
Freq: Once | INTRAVENOUS | Status: AC
  Administered 2011-01-17: 05:00:00 via INTRAVENOUS

## 2011-01-17 MED ORDER — BIOTENE DRY MOUTH MT LIQD
15.0000 mL | Freq: Every day | OROMUCOSAL | Status: DC
  Administered 2011-01-17 – 2011-02-02 (×84): 15 mL via OROMUCOSAL
  Filled 2011-01-17: qty 15

## 2011-01-17 MED ORDER — MIDAZOLAM HCL 2 MG/2ML IJ SOLN
2.0000 mg | Freq: Once | INTRAMUSCULAR | Status: AC
  Administered 2011-01-17: 2 mg via INTRAVENOUS

## 2011-01-17 MED ORDER — SODIUM CHLORIDE 0.9 % IV BOLUS (SEPSIS)
1000.0000 mL | Freq: Once | INTRAVENOUS | Status: AC
  Administered 2011-01-17: 1000 mL via INTRAVENOUS

## 2011-01-17 MED ORDER — NALOXONE HCL 0.4 MG/ML IJ SOLN
INTRAMUSCULAR | Status: AC
  Filled 2011-01-17: qty 1

## 2011-01-17 MED ORDER — PROPOFOL 10 MG/ML IV EMUL
INTRAVENOUS | Status: AC
  Administered 2011-01-17: 10 ug/kg/min via INTRAVENOUS
  Filled 2011-01-17: qty 100

## 2011-01-17 MED ORDER — CHLORHEXIDINE GLUCONATE 0.12 % MT SOLN
15.0000 mL | Freq: Two times a day (BID) | OROMUCOSAL | Status: DC
  Administered 2011-01-17 – 2011-02-05 (×37): 15 mL via OROMUCOSAL
  Filled 2011-01-17 (×43): qty 15

## 2011-01-17 MED ORDER — CIPROFLOXACIN IN D5W 400 MG/200ML IV SOLN
400.0000 mg | Freq: Two times a day (BID) | INTRAVENOUS | Status: DC
  Administered 2011-01-17 – 2011-01-22 (×11): 400 mg via INTRAVENOUS
  Filled 2011-01-17 (×12): qty 200

## 2011-01-17 MED ORDER — VANCOMYCIN HCL IN DEXTROSE 1-5 GM/200ML-% IV SOLN
1000.0000 mg | INTRAVENOUS | Status: DC
  Administered 2011-01-18 – 2011-01-24 (×8): 1000 mg via INTRAVENOUS
  Filled 2011-01-17 (×8): qty 200

## 2011-01-17 MED ORDER — LORAZEPAM 2 MG/ML IJ SOLN
0.5000 mg | INTRAMUSCULAR | Status: DC
Start: 2011-01-17 — End: 2011-01-17

## 2011-01-17 MED ORDER — VANCOMYCIN HCL IN DEXTROSE 1-5 GM/200ML-% IV SOLN
1000.0000 mg | INTRAVENOUS | Status: AC
  Administered 2011-01-17: 1000 mg via INTRAVENOUS
  Filled 2011-01-17: qty 200

## 2011-01-17 MED ORDER — VECURONIUM BROMIDE 10 MG IV SOLR
INTRAVENOUS | Status: AC
  Filled 2011-01-17: qty 10

## 2011-01-17 MED ORDER — WARFARIN SODIUM 7.5 MG PO TABS
7.5000 mg | ORAL_TABLET | Freq: Once | ORAL | Status: DC
  Filled 2011-01-17: qty 1

## 2011-01-17 MED ORDER — PHENYLEPHRINE HCL 10 MG/ML IJ SOLN
30.0000 ug/min | INTRAVENOUS | Status: DC
  Administered 2011-01-17 – 2011-01-18 (×2): 20 ug/min via INTRAVENOUS
  Filled 2011-01-17 (×3): qty 1

## 2011-01-17 MED ORDER — PANTOPRAZOLE SODIUM 40 MG IV SOLR
40.0000 mg | Freq: Every day | INTRAVENOUS | Status: DC
  Administered 2011-01-17 – 2011-01-22 (×6): 40 mg via INTRAVENOUS
  Filled 2011-01-17 (×9): qty 40

## 2011-01-17 MED ORDER — PIPERACILLIN-TAZOBACTAM 3.375 G IVPB 30 MIN
3.3750 g | INTRAVENOUS | Status: AC
  Administered 2011-01-17: 3.375 g via INTRAVENOUS
  Filled 2011-01-17: qty 50

## 2011-01-17 MED ORDER — FENTANYL CITRATE 0.05 MG/ML IJ SOLN
50.0000 ug | INTRAMUSCULAR | Status: DC | PRN
  Administered 2011-01-17 – 2011-01-19 (×11): 50 ug via INTRAVENOUS
  Filled 2011-01-17 (×10): qty 2

## 2011-01-17 MED ORDER — WARFARIN SODIUM 5 MG IV SOLR
7.5000 mg | Freq: Once | INTRAVENOUS | Status: AC
  Administered 2011-01-17: 7.5 mg via INTRAVENOUS
  Filled 2011-01-17: qty 3.75

## 2011-01-17 MED ORDER — ETOMIDATE 2 MG/ML IV SOLN
10.0000 mg | Freq: Once | INTRAVENOUS | Status: AC
  Administered 2011-01-17: 15 mg via INTRAVENOUS
  Administered 2011-01-17: 5 mg via INTRAVENOUS

## 2011-01-17 MED ORDER — SODIUM CHLORIDE 0.9 % IV SOLN
Freq: Once | INTRAVENOUS | Status: AC
  Administered 2011-01-17: 07:00:00 via INTRAVENOUS

## 2011-01-17 MED ORDER — SODIUM CHLORIDE 0.9 % IV SOLN
Freq: Once | INTRAVENOUS | Status: AC
  Administered 2011-01-17: 1000 mL via INTRAVENOUS

## 2011-01-17 MED ORDER — PIPERACILLIN-TAZOBACTAM 3.375 G IVPB
3.3750 g | Freq: Three times a day (TID) | INTRAVENOUS | Status: DC
  Administered 2011-01-17 – 2011-01-26 (×27): 3.375 g via INTRAVENOUS
  Filled 2011-01-17 (×29): qty 50

## 2011-01-17 MED ORDER — PROPOFOL 10 MG/ML IV EMUL
5.0000 ug/kg/min | INTRAVENOUS | Status: DC
  Administered 2011-01-17: 10 ug/kg/min via INTRAVENOUS

## 2011-01-17 MED ORDER — FENTANYL BOLUS VIA INFUSION
50.0000 ug | Freq: Four times a day (QID) | INTRAVENOUS | Status: DC | PRN
  Filled 2011-01-17: qty 100

## 2011-01-17 MED ORDER — ETOMIDATE 2 MG/ML IV SOLN
INTRAVENOUS | Status: AC
  Administered 2011-01-17: 15 mg via INTRAVENOUS
  Filled 2011-01-17: qty 10

## 2011-01-17 MED ORDER — SODIUM CHLORIDE 0.9 % IV SOLN
50.0000 ug/h | INTRAVENOUS | Status: DC
  Administered 2011-01-17: 100 ug/h via INTRAVENOUS
  Filled 2011-01-17: qty 50

## 2011-01-17 MED ORDER — CHLORHEXIDINE GLUCONATE 0.12 % MT SOLN
OROMUCOSAL | Status: AC
  Administered 2011-01-17: 15 mL via OROMUCOSAL
  Filled 2011-01-17: qty 15

## 2011-01-17 MED ORDER — FENTANYL CITRATE 0.05 MG/ML IJ SOLN
INTRAMUSCULAR | Status: AC
  Administered 2011-01-17: 50 ug via INTRAVENOUS
  Filled 2011-01-17: qty 2

## 2011-01-17 MED ORDER — MIDAZOLAM HCL 2 MG/2ML IJ SOLN
INTRAMUSCULAR | Status: AC
  Administered 2011-01-17: 2 mg
  Filled 2011-01-17: qty 2

## 2011-01-17 MED ORDER — MIDAZOLAM HCL 2 MG/2ML IJ SOLN
INTRAMUSCULAR | Status: AC
  Administered 2011-01-17: 2 mg via INTRAVENOUS
  Filled 2011-01-17: qty 4

## 2011-01-17 NOTE — Progress Notes (Signed)
Name: [name] MRN: [id number] DOB: [birth year]    LOS: 16  PCCM PROGRESS NOTE  History of Present Illness: 65 y/o F with PMHx of HTN suffered fall on 12/24/10 and LE pain. Initially taken to [facility] and diagnosed with R femoral DVT and started on anticoagulation. Pt had persistent pain and she had further imaging studies which showed R femoral fracture. Pt was transferred to [facility] for further management and was taken to the OR on 01/03/11 and underwent Open reduction and internal fixation of the right femur fracture. Post op suffered episode of Torsades and underwent 7-10 mins CPR. Subsequently developed AFRVR and hypotension. EKG demonstrated ST elevation on the inferior leads. Underwent cardiac cath 12/7. PCCM asked to assist with vent/CCM issues.  Extubated 01/10/11 and transferred out of 2300 12/18.  However on 12/20 developed acute onset shortness of breath, likely due to aspiration, re-intubated.  Lines / Drains: 12/07 L IJ CVL>>> 12/14 12/07 ETT>>> 12/10, 12/11 >>> 12/14 , 12/21 >> 12/14 PICC >>>  12/15 Foley>>>  Cultures: 12/10 UC>>>neg 12/10 RC>>>neg  Antibiotics: 12/10 Vancomycin >>> 12/11  12/10 Ceftazidime (fever, purulent sputum, cloudy urine) >>> 12/18 12/21 vanc (HCAP vs. Asp PNA) >> 12/21 Zosyn (HCAP vs. Asp PNA) >> 12/21 Cipro (HCAP vs. 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 Severe left ventricular dysfunction with inferior apical and distal anterior hypokinesis of severe degree. The estimated ejection fraction is 25%.  12/08 CT head: No acute abnormalities  12/08 Echo: Systolic function was moderately to severely reduced. The estimated ejection fraction was in the range of 30% to 35%. There is akinesis of the apical  myocardium. There is akinesis of the inferoposterior myocardium. There is hypokinesis of the lateral myocardium  12/11 Reintubated for sudden onset resp distress  12/11 CTA chest: No evidence of pulmonary embolus. Moderate bilateral effusions with significant consolidation and atelectasis in both lungs. Perihilar infiltration suggesting edema. Indeterminate mass or fluid collection in the right supraclavicular region measuring about 3 x 2.2 cm.  12/14 Extubated 12/20 Acute resp distress, transfer to 3300 12/21 Intubated for aspiration pneumonia  Overnight:  Intubated for respiratory insufficiency, likely due to aspiration event.  Vital Signs: Temp:  [98.1 F (36.7 C)-99.8 F (37.7 C)] 99.8 F (37.7 C) (12/21 0000) Pulse Rate:  [83-114] 108  (12/21 0020) Resp:  [20-44] 32  (12/21 0020) BP: (99-156)/(67-95) 99/67 mmHg (12/21 0020) SpO2:  [94 %-100 %] 100 % (12/21 0020) Weight:  [44.5 kg (98 lb 1.7 oz)] 98 lb 1.7 oz (44.5 kg) (12/20 0300) I/O last 3 completed shifts: In: 1605 [P.O.:720; I.V.:877; IV Piggyback:8] Out: 925 [Urine:925]  Physical Examination: Gen: acute resp distress, RR 40 on bipap HEENT: NCAT, PERRL, EOMi, MM dry PULM: CTA B on L, bronchial breath sounds on R CV: Tachycardic, no mgr, no JVD AB: BS+, soft, nontender, no hsm Ext: warm, no edema, no clubbing, no cyanosis; R PICC site dressing c/d/i Derm: no rash or skin breakdown Neuro: Delirious, does not follow commands, moves all four ext spontaneously   Ventilator settings:      Labs and Imaging:  Reviewed.  Please refer to the Assessment and Plan section for relevant results.  Assessment and Plan:  Ischemic cardiomyopathy, MI, status post torsades cardiac  arrest, atrial fibrillation with RVR, hypertension.  Status post bare metal stent placement. Cardiology following.   01/17/11 update: respiratory failure due to likely aspiration pneumonia vs. HCAP.  Respiratory Failure: aspiration pneumonia vs. HCAP (WBC  up, febrile), exam not consistent with volume overload. -->full vent  Support -->sent blood cultures and sputum culture -->vanc/zosyn/cipro -->f/u abg -->f/u CXR  Lab 01/15/11 1439 01/15/11 0410 01/14/11 1500 01/14/11 0445 01/13/11 0615  CREATININE 0.53 0.55 0.61 0.56 0.53    Lab 01/15/11 1439 01/15/11 0410 01/14/11 1500 01/14/11 0445 01/13/11 0615  NA 143 143 142 142 144    -->continue ASA, Paix (d/c after 30 days per Cardiology) -->continue Metoprolol, Lisinopril -->continue Crestor -->continue Amiodarone   (for 6-8 weeks per Cardiology) -->continue Lasix, decreased to 40 mg IV daily  Hypokalemia  Lab 01/15/11 1439 01/15/11 0410 01/14/11 1500 01/14/11 0445 01/13/11 0615  K 4.1 2.9* 2.9* 2.5* 4.0   -->replaced by eLink (30 mmol KPhos) -->give more (40 mEq PO) -->BMP 15:00  Hypophosphatemia -->replaced -->Phos AM   Right Lower Extremity DVT  -->Heparin drip on board and therapeutic level is achieved  -->hold Coumadin until extubated  Anemia, stable   Lab 01/16/11 1916 01/15/11 0410 01/14/11 0445 01/13/11 0615 01/12/11 0522  HCT 34.9* 28.3* 26.7* 27.5* 26.8*   -->monitor Hct  Malnutrition, dysphagia.  Swallowing assessment completed. -->nutrition recommendations pending  Encephalopathy / delirium, improved -->sitter -->monitor  Best practices / Disposition -->Downgrade to telemetry (torsades) under TR[facility] (team 8) - signed out -->d/c Foley -->full code -->DVT Px is not indicated as on therapeutic Heparin -->GI Px is not indicated -->Diet  Family updated by [facility] service, I will attempt to visit with them face to face this evening as well.  [name]   01/17/2011, 12:45 AM

## 2011-01-17 NOTE — Progress Notes (Signed)
UR Completed.  [name] [phone] 01/17/2011

## 2011-01-17 NOTE — Progress Notes (Signed)
Nutrition Follow-up  Pt transferred back to SICU due to respiratory distress requiring intubation; possibly related to aspiration.  S/p bedside percutaneous dilatation tracheostomy 12/21.  Re-estimated Daily Nutrition Needs: 1,100-1,200 kcals, 65-75 gm protein  Diet Order:  NPO  Meds: Scheduled Meds:   . sodium chloride   Intravenous Once  . sodium chloride   Intravenous Once  . sodium chloride   Intravenous Once  . albuterol  2.5 mg Nebulization Once  . amiodarone  200 mg Oral Daily  . antiseptic oral rinse  15 mL Mouth Rinse 6 X Daily  . aspirin  81 mg Oral Daily  . chlorhexidine  15 mL Mouth Rinse BID  . ciprofloxacin  400 mg Intravenous Q12H  . clopidogrel  75 mg Oral Q breakfast  . cyanocobalamin  1,000 mcg Intramuscular Weekly  . etomidate  10 mg Intravenous Once  . fentaNYL      . levalbuterol  0.63 mg Nebulization TID  . LORazepam  0.5 mg Intravenous Once  . midazolam      . midazolam  2 mg Intravenous Once  . pantoprazole (PROTONIX) IV  40 mg Intravenous Daily  . piperacillin-tazobactam  3.375 g Intravenous NOW  . piperacillin-tazobactam (ZOSYN)  IV  3.375 g Intravenous Q8H  . rosuvastatin  10 mg Oral Daily  . sodium chloride  1,000 mL Intravenous Once  . sodium chloride  1,000 mL Intravenous Once  . sodium chloride      . vancomycin  1,000 mg Intravenous NOW  . vancomycin  1,000 mg Intravenous Q24H  . warfarin  7.5 mg Oral ONCE-1800  . DISCONTD: feeding supplement  1 Container Oral TID WC  . DISCONTD: ferrous sulfate  325 mg Oral BID WC  . DISCONTD: furosemide  40 mg Intravenous BID  . DISCONTD: lisinopril  5 mg Oral Daily  . DISCONTD: LORazepam  0.5 mg Intravenous NOW  . DISCONTD: metoprolol tartrate  12.5 mg Oral BID  . DISCONTD: potassium chloride  40 mEq Oral Once  . DISCONTD: potassium chloride  20 mEq Oral BID  . DISCONTD: warfarin  7.5 mg Oral ONCE-1800   Continuous Infusions:   . heparin 9 mL/hr (01/17/11 1300)  . phenylephrine (NEO-SYNEPHRINE)  Adult infusion 30 mcg/min (01/17/11 1318)  . DISCONTD: fentaNYL infusion INTRAVENOUS Stopped (01/17/11 1239)  . DISCONTD: propofol Stopped (01/17/11 1239)   PRN Meds:.sodium chloride, acetaminophen, bisacodyl, fentaNYL, levalbuterol, magnesium hydroxide, DISCONTD: fentaNYL, DISCONTD: food thickener  Labs:  CMP     Component Value Date/Time   NA 143 01/17/2011 0600   K 3.1* 01/17/2011 0600   CL 114* 01/17/2011 0600   CO2 21 01/17/2011 0600   GLUCOSE 100* 01/17/2011 0600   BUN 12 01/17/2011 0600   CREATININE 0.68 01/17/2011 0600   CALCIUM 7.5* 01/17/2011 0600   PROT 6.8 01/13/2011 0615   ALBUMIN 2.5* 01/13/2011 0615   AST 115* 01/13/2011 0615   ALT 297* 01/13/2011 0615   ALKPHOS 120* 01/13/2011 0615   BILITOT 1.2 01/13/2011 0615   GFRNONAA 89* 01/17/2011 0600   GFRAA >90 01/17/2011 0600     Intake/Output Summary (Last 24 hours) at 01/17/11 1344 Last data filed at 01/17/11 1318  Gross per 24 hour  Intake 5449.82 ml  Output   1335 ml  Net 4114.82 ml    Weight Status:  46.5 kg (12/21) -- stable  Nutrition Dx:  Inadequate Oral Intake now r/t inability to eat as evidenced by NPO status  Goal:  Meet >90% of estimated nutrition needs with PO diet  vs EN initiation, currently unmet Monitor: PO diet advancement vs EN initiation, labs, weight, I/O's  Intervention/Plan:    If short-term EN started, recommend Promote formula -- initiate at 20 ml/hr, advance 10 ml every 4 hours until goal rate of 45 ml/hr reached to provide 1,080 kcals, 67 gm protein, 904 ml of free water  RD to follow, adjust nutrition care plan accordingly   Alger Memos Pager #:  [phone]

## 2011-01-17 NOTE — Progress Notes (Signed)
Speech Language/Pathology  Reviewed chart. Noted patient is now re-intubated secondary to questionable aspiration event. SLP signing off secondary to intubation. Recommend re-evaluation of swallowing including objective evaluation (MBS vs FEES) prior to resuming diet following extubation. MD please order when appropriate.   Thank you  Ferdinand Lango MA, CCC-SLP [phone]

## 2011-01-17 NOTE — Progress Notes (Signed)
This CSW signing back on. Note discussion for possible trach and will continue to follow to provide support and facilitate d/c planning as appropriate to pt medical progression.  Baxter Flattery, MSW [phone]

## 2011-01-17 NOTE — Progress Notes (Signed)
Patient arrived to 3308 at 2200 from 4700. Patient Vitals were stable at that time. 2245 patient RR 30-40's O2 100%. [facility] MD and E-Link  notified at this time to take a look at this patient. Patient was given 0.5mg  of ativan and placed on Bi-Pap with no change. PCCM was called to bedside and patient intubated. Patient transferred to 2308 report given at bedside.  Bess Harvest Renee 01/17/2011 1:03 AM

## 2011-01-17 NOTE — Progress Notes (Signed)
01/17/11 7:30 PT Note: Noted pt re-intubated and PT discontinued.  Please reorder PT when needed.  Thanks.  01/17/2011 Cephus Shelling, PT, DPT [phone]

## 2011-01-17 NOTE — Procedures (Signed)
Bronchoscopy Procedure Note Daneen Volcy [id number] Aug 19, 1945  Procedure: Bronchoscopy Indications: Diagnostic evaluation of the airways- for trach  Procedure Details Consent: Risks of procedure as well as the alternatives and risks of each were explained to the (patient/caregiver).  Consent for procedure obtained. Time Out: Verified patient identification, verified procedure, site/side was marked, verified correct patient position, special equipment/implants available, medications/allergies/relevent history reviewed, required imaging and test results available.  Performed  In preparation for procedure, patient was given 100% FiO2. Sedation: Etomidate  Airway entered and the following bronchi were examined: Bronchi.   Procedures performed: Brushings performed Bronchoscope removed.    Evaluation Hemodynamic Status: BP stable throughout; O2 sats: stable throughout Patient's Current Condition: stable Specimens:  None Complications: No apparent complications Patient did tolerate procedure well.   Observed needle, catheter, wire, punch dil and dilation then trach 6 placed. Without apparent injury Trach then scoped, carina at 3.5 cm below without bleeding  Nelda Bucks. 01/17/2011

## 2011-01-17 NOTE — Procedures (Signed)
Name:  [name] MRN:  [id number] DOB:  Mar 17, 1945  PROCEDURE NOTE  Procedure:  Bedside percutaneous dilatation tracheostomy   Indications:  Ventilator dependent respiratory failure, multiple intubations  Consent:  From daughter (medical POA)  Anesthesia:  Patient was on continues sedation prior to procedure.  A total of 10 mL of 1% Lidocaine was used for local infiltration anesthesia.  Procedure summary:  Appropriate equipment was assembled.  The patient was identified as [name] and safety timeout was performed. The patient was placed in supine position with a towel roll behind her shoulders to achieve neck estension.  Sterile technique was used. The patient's neck was prepped using chlorhexidine / alcohol scrub and the field was draped in usual sterile fashion with full body drape. After the adequate infiltration anesthesia was achieved, 1 cm horizontal skin incision was made 1.5 cm above the suprasternal notch. The trachea was then cannulated with needle without difficulty under bronchoscopic guidance.  A guide wire was advanced through the introducer and serial dilators were used under bronchoscopic guidance. 6 French cuffed tracheostomy tube was then inserted and cuff  Inflated.  The bronchoscope was then used through the tracheostomy tube to confirm proper position.  The patient was then extubated orally and ventilation resumed through the tracheostomy tube.  Post-procedure chest x-ray was ordered.  Complications:  No immediate complications were noted.  Hemodynamic parameters and oxygenation remained stable throughout the procedure.  Estimated blood loss:  Less then 5 mL.  Orlean Bradford, M.D. Pulmonary and Critical Care Medicine [facility] Cell: [phone] Pager: [phone]  01/17/2011, 12:53 PM

## 2011-01-17 NOTE — Progress Notes (Signed)
Received report from day time MD that pt was more tachypneic with increased resp effort earlier today. Arrived in step down around 10pm. RN called this NP secondary to worsening resp status. NP to bedside. Pt awake and confused. Says she is having trouble breathing. Resp effort increased with use of intercostals. RRR. Lungs with crackles at bases. Denies chest pain. RR 36-40. BP 130's. Sao2 on RA is 98%. HR 112. Stat ABG obtained with PO2 in 70's and CO2 low(which is changed) with pH of 7.5 (which is about the same pH as before). Bipap started. Ativan given for resp distress and pt pulling at tubes/mask. Sleepy after that but arousable. CXR earlier today showed atelectasis with ? Infiltrate. Pt just finished 7 days of abx, but WBCC increased today from 9 to 16.   Dr. Toniann Fail called to [facility]. Critical care consult called. Dr. Kendrick Fries to BS. BP has fallen into the 90's from earlier 140's. Decision made to intubate and TF to ICU. Daughter called at 0015 and made aware of critical situation. She verified that pt is a full code and they want "everything done to try and save her mom". Advised daughter to come to hospital and she agreed to come. Pt TF to critcal care team.  Maren Reamer, NP

## 2011-01-17 NOTE — Procedures (Signed)
Intubation Procedure Note [name] [id number] 1945/04/13  Procedure: Intubation Indications: Respiratory insufficiency  Procedure Details Consent: Risks of procedure as well as the alternatives and risks of each were explained to the (patient/caregiver).  Consent for procedure obtained. Time Out: Verified patient identification, verified procedure, site/side was marked, verified correct patient position, special equipment/implants available, medications/allergies/relevent history reviewed, required imaging and test results available.  Performed  Drugs: Etomidate 10mg  IV MAC 4 used, DLx1, grade II view.  Tube passed through cords under direct visualization   Evaluation Hemodynamic Status: BP stable throughout; O2 sats: stable throughout Patient's Current Condition: stable Complications: No apparent complications Patient did tolerate procedure well. Chest X-ray ordered to verify placement.  CXR: pending.   [name] 01/17/2011

## 2011-01-17 NOTE — Progress Notes (Signed)
ANTIBIOTIC CONSULT NOTE - INITIAL  Pharmacy Consult for Vancocin/Zosyn/Cipro Indication: rule out pneumonia  No Known Allergies  Patient Measurements: Height: 5' (152.4 cm) Weight: 98 lb 1.7 oz (44.5 kg) (bed scale(pt wearing two boots on feet, hig fall)) IBW/kg (Calculated) : 45.5   Vital Signs: Temp: 98.6 F (37 C) (12/21 0100) Temp src: Oral (12/21 0000) BP: 103/62 mmHg (12/21 0100) Pulse Rate: 106  (12/21 0100) Intake/Output from previous day: 12/20 0701 - 12/21 0700 In: 855.4 [P.O.:360; I.V.:495.4] Out: 1320 [Urine:1320] Intake/Output from this shift: Total I/O In: 485.4 [I.V.:485.4] Out: 1045 [Urine:1045]  Labs:  Basename 01/16/11 1916 01/15/11 1439 01/15/11 0410 01/14/11 1500 01/14/11 0445  WBC 14.7* -- 9.1 -- 9.6  HGB 10.5* -- 8.6* -- 8.4*  PLT 367 -- 326 -- 384  LABCREA -- -- -- -- --  CREATININE -- 0.53 0.55 0.61 --   Estimated Creatinine Clearance: 47.9 ml/min (by C-G formula based on Cr of 0.53).  Microbiology: Recent Results (from the past 720 hour(s))  URINE CULTURE     Status: Normal   Collection Time   12/27/10  3:43 PM      Component Value Range Status Comment   Specimen Description URINE, CATHETERIZED   Final    Special Requests NONE   Final    Setup Time [id number]   Final    Colony Count NO GROWTH   Final    Culture NO GROWTH   Final    Report Status 12/29/2010 FINAL   Final   CULTURE, BLOOD (ROUTINE X 2)     Status: Normal   Collection Time   12/28/10 12:27 AM      Component Value Range Status Comment   Specimen Description BLOOD RIGHT HAND   Final    Special Requests BOTTLES DRAWN AEROBIC AND ANAEROBIC 6CC   Final    Culture NO GROWTH 5 DAYS   Final    Report Status 01/02/2011 FINAL   Final   CULTURE, BLOOD (ROUTINE X 2)     Status: Normal   Collection Time   12/28/10 12:31 AM      Component Value Range Status Comment   Specimen Description BLOOD LEFT ARM   Final    Special Requests BOTTLES DRAWN AEROBIC AND ANAEROBIC 6CC   Final      Culture NO GROWTH 5 DAYS   Final    Report Status 01/02/2011 FINAL   Final   MRSA PCR SCREENING     Status: Normal   Collection Time   01/02/11  7:46 PM      Component Value Range Status Comment   MRSA by PCR NEGATIVE  NEGATIVE  Final   CULTURE, RESPIRATORY     Status: Normal   Collection Time   01/06/11 12:05 PM      Component Value Range Status Comment   Specimen Description ENDOTRACHEAL ASPIRATE   Final    Special Requests NONE   Final    Gram Stain     Final    Value: FEW WBC PRESENT,BOTH PMN AND MONONUCLEAR     NO SQUAMOUS EPITHELIAL CELLS SEEN     NO ORGANISMS SEEN   Culture Non-Pathogenic Oropharyngeal-type Flora Isolated.   Final    Report Status 01/09/2011 FINAL   Final   MRSA PCR SCREENING     Status: Normal   Collection Time   01/16/11 10:03 PM      Component Value Range Status Comment   MRSA by PCR NEGATIVE  NEGATIVE  Final  Medical History: Past Medical History  Diagnosis Date  . Hypertension   . 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 Vitamin B12 deficiency (dietary) anemia 12/27/2010  . Arthritis   . MI (myocardial infarction) 01/09/2011  . Aspiration pneumonia 01/17/2011    Medications:  Scheduled:    . albuterol  2.5 mg Nebulization Once  . amiodarone  200 mg Oral Daily  . aspirin  81 mg Oral Daily  . chlorhexidine      . ciprofloxacin  400 mg Intravenous Q12H  . clopidogrel  75 mg Oral Q breakfast  . cyanocobalamin  1,000 mcg Intramuscular Weekly  . etomidate  10 mg Intravenous Once  . fentaNYL      . levalbuterol  0.63 mg Nebulization TID  . lisinopril  5 mg Oral Daily  . LORazepam  0.5 mg Intravenous Once  . metoprolol tartrate  12.5 mg Oral BID  . midazolam      . midazolam  2 mg Intravenous Once  . pantoprazole (PROTONIX) IV  40 mg Intravenous Daily  . piperacillin-tazobactam  3.375 g Intravenous NOW  . piperacillin-tazobactam (ZOSYN)  IV  3.375 g Intravenous Q8H  . rosuvastatin  10 mg Oral Daily  . sodium chloride       . vancomycin  1,000 mg Intravenous NOW  . vancomycin  1,000 mg Intravenous Q24H  . DISCONTD: feeding supplement  1 Container Oral TID WC  . DISCONTD: ferrous sulfate  325 mg Oral BID WC  . DISCONTD: furosemide  40 mg Intravenous BID  . DISCONTD: LORazepam  0.5 mg Intravenous NOW  . DISCONTD: potassium chloride  40 mEq Oral Once  . DISCONTD: potassium chloride  20 mEq Oral BID  . DISCONTD: warfarin  7.5 mg Oral ONCE-1800   Infusions:    . fentaNYL infusion INTRAVENOUS    . heparin 900 Units/hr (01/17/11 0200)  . propofol 1.873 mcg/kg/min (01/17/11 0200)  . DISCONTD: heparin 10.5 mL/hr (01/16/11 0800)   Assessment: 65yo female with complex hospital admission now re-intubated after acute episode of SOB, assumed to be related to aspiration, to resume IV ABX for aspiration PNA vs HCAP.  Elita Quick course completed ~2d ago.  Goal of Therapy:  Vancomycin trough level 15-20 mcg/ml  Plan:  Will begin vancomycin 1000mg  IV Q24H, Zosyn 3.375g IV Q8H, and Cipro 400mg  IV Q12H.  Monitor CBC, Cx, CXR, levels prn.  Colleen Can PharmD BCPS 01/17/2011,2:27 AM

## 2011-01-17 NOTE — Progress Notes (Signed)
CRITICAL VALUE ALERT  Critical value received:  CKMB 6.9  Date of notification:  01/17/11   Time of notification:  1019   Critical value read back: yes  Nurse who received alert:  Donalee Citrin RN    MD notified (1st page):  Dr. Jens Som  Time of first page:  1019  MD notified (2nd page):  Time of second page:  Responding MD:  Dr. Jens Som  Time MD responded:  1020

## 2011-01-17 NOTE — Progress Notes (Signed)
@   Subjective:  Events noted; patient with increased respiratory stress last PM requiring intubation; possibly related to aspiration. Patient intubated but alert; denies chest pain.   Objective:  Filed Vitals:   01/17/11 0615 01/17/11 0630 01/17/11 0731 01/17/11 0745  BP: 94/55 93/55  87/49  Pulse:  90  84  Temp:   100.1 F (37.8 C)   TempSrc:   Oral   Resp: 19 19  18   Height:      Weight:  102 lb 8.2 oz (46.5 kg)    SpO2:  100%  100%    Intake/Output from previous day:  Intake/Output Summary (Last 24 hours) at 01/17/11 0753 Last data filed at 01/17/11 0700  Gross per 24 hour  Intake 3467.65 ml  Output   1400 ml  Net 2067.65 ml    Physical Exam: Physical exam: Intubated Skin is warm and dry.  HEENT is normal.  Neck is supple. No thyromegaly.  Chest mild diffuse rhonchi Cardiovascular exam RRR Abdominal exam nontender or distended. No masses palpated. Extremies s/p right leg surgery with no edema Neuro moves all extremities     Lab Results: Basic Metabolic Panel:  Basename 01/17/11 0600 01/16/11 0655 01/15/11 1439  NA 143 -- 143  K 3.1* -- 4.1  CL 114* -- 109  CO2 21 -- 26  GLUCOSE 100* -- 129*  BUN 12 -- 10  CREATININE 0.68 -- 0.53  CALCIUM 7.5* -- 8.8  MG -- -- --  PHOS -- 3.4 --   CBC:  Basename 01/17/11 0600 01/16/11 1916  WBC 12.1* 14.7*  NEUTROABS -- --  HGB 8.0* 10.5*  HCT 26.1* 34.9*  MCV 91.3 91.6  PLT 234 367      Assessment/Plan:  #1 S/P MI complicated by cardiac arrest. Patient is status post bare-metal stent to the right coronary artery. Continue aspirin and Plavix. DC plavix after 30 days given need for coumadin with DVT. #2 ischemic cardiomyopathy-May need to hold ACEI and beta blocker as BP borderline; resume when pressure allows. Euvolemic on exam. #3 ventricular and atrial arrhythmias-continue amiodarone 200 mg daily; DC in 6-8 weeks if she maintains sinus. #4 recent DVT-continue intravenous heparin. No PE on CT scan. Continue  coumadin #5 VDRF- felt related to aspiration; check enzymes to R/O recurrent ischemia #6 anemia #7 pneumonia-Antibiotics per critical care medicine. #8 status post repair of leg fracture-management per orthopedic surgery. #9 hypokalemia - supplement    Olga Millers 01/17/2011, 7:53 AM

## 2011-01-17 NOTE — Progress Notes (Signed)
ANTICOAGULATION CONSULT NOTE - Follow Up Consult  Pharmacy Consult for Heparin and Coumadin Indication: DVT  No Known Allergies  Vital Signs: Temp: 100.1 F (37.8 C) (12/21 0731) Temp src: Oral (12/21 0731) BP: 102/60 mmHg (12/21 0830) Pulse Rate: 91  (12/21 0830)  Labs:  Basename 01/17/11 0600 01/16/11 1916 01/16/11 1708 01/16/11 0655 01/15/11 1439 01/15/11 0410  HGB 8.0* 10.5* -- -- -- --  HCT 26.1* 34.9* -- -- -- 28.3*  PLT 234 367 -- -- -- 326  APTT -- -- -- -- -- --  LABPROT -- -- -- 16.0* -- 16.7*  INR -- -- -- 1.25 -- 1.33  HEPARINUNFRC 0.39 -- 0.70 0.92* -- --  CREATININE 0.68 -- -- -- 0.53 0.55  CKTOTAL -- -- -- -- -- --  CKMB -- -- -- -- -- --  TROPONINI -- -- -- -- -- --   Estimated Creatinine Clearance: 49 ml/min (by C-G formula based on Cr of 0.68).   Assessment: 65yof on Heparin bridging to Coumadin for recent DVT (11/12).   Goal of Therapy:  INR 2-3 Heparin level 0.3-0.7 units/ml   Plan:  1. Continue heparin at 900 units/hr 2. Coumadin 7.5mg  po x1 3. Follow-up AM heparin level and INR  Elwin Sleight [phone] 01/17/2011,9:15 AM

## 2011-01-18 ENCOUNTER — Inpatient Hospital Stay (HOSPITAL_COMMUNITY): Payer: Medicare Other

## 2011-01-18 LAB — GLUCOSE, CAPILLARY: Glucose-Capillary: 123 mg/dL — ABNORMAL HIGH (ref 70–99)

## 2011-01-18 LAB — HEPARIN LEVEL (UNFRACTIONATED): Heparin Unfractionated: 0.14 IU/mL — ABNORMAL LOW (ref 0.30–0.70)

## 2011-01-18 MED ORDER — HEPARIN BOLUS VIA INFUSION
1000.0000 [IU] | Freq: Once | INTRAVENOUS | Status: AC
  Administered 2011-01-18: 1000 [IU] via INTRAVENOUS
  Filled 2011-01-18: qty 1000

## 2011-01-18 MED ORDER — FAMOTIDINE IN NACL 20-0.9 MG/50ML-% IV SOLN
20.0000 mg | Freq: Two times a day (BID) | INTRAVENOUS | Status: DC
  Administered 2011-01-18 (×2): 20 mg via INTRAVENOUS
  Filled 2011-01-18 (×2): qty 50

## 2011-01-18 NOTE — Progress Notes (Signed)
ANTICOAGULATION CONSULT NOTE - Follow Up Consult  Pharmacy Consult for heparin/Coumadin Indication: DVT  No Known Allergies  Patient Measurements: Height: 5' (152.4 cm) Weight: 105 lb 9.6 oz (47.9 kg) IBW/kg (Calculated) : 45.5   Vital Signs: Temp: 100.2 F (37.9 C) (12/22 1116) Temp src: Oral (12/22 1116) BP: 111/65 mmHg (12/22 1222) Pulse Rate: 77  (12/22 1222)  Labs:  Basename 01/18/11 0945 01/17/11 2127 01/17/11 0930 01/17/11 0600 01/16/11 1916 01/16/11 0655  HGB -- -- -- 8.0* 10.5* --  HCT -- -- -- 26.1* 34.9* --  PLT -- -- -- 234 367 --  APTT -- -- -- -- -- --  LABPROT -- -- -- -- -- 16.0*  INR -- -- -- -- -- 1.25  HEPARINUNFRC 0.14* 0.42 -- 0.39 -- --  CREATININE -- -- -- 0.68 -- --  CKTOTAL -- -- 50 -- -- --  CKMB -- -- 6.9* -- -- --  TROPONINI -- -- <0.30 -- -- --   Estimated Creatinine Clearance: 49 ml/min (by C-G formula based on Cr of 0.68).   Medications:  Scheduled:     . albuterol  2.5 mg Nebulization Once  . amiodarone  200 mg Oral Daily  . antiseptic oral rinse  15 mL Mouth Rinse 6 X Daily  . aspirin  81 mg Oral Daily  . chlorhexidine  15 mL Mouth Rinse BID  . ciprofloxacin  400 mg Intravenous Q12H  . clopidogrel  75 mg Oral Q breakfast  . cyanocobalamin  1,000 mcg Intramuscular Weekly  . famotidine (PEPCID) IV  20 mg Intravenous Q12H  . heparin  1,000 Units Intravenous Once  . levalbuterol  0.63 mg Nebulization TID  . pantoprazole (PROTONIX) IV  40 mg Intravenous Daily  . piperacillin-tazobactam (ZOSYN)  IV  3.375 g Intravenous Q8H  . rosuvastatin  10 mg Oral Daily  . vancomycin  1,000 mg Intravenous Q24H  . warfarin  7.5 mg Intravenous ONCE-1800  . DISCONTD: warfarin  7.5 mg Oral ONCE-1800   Infusions:     . heparin 900 Units/hr (01/18/11 0427)  . phenylephrine (NEO-SYNEPHRINE) Adult infusion 20 mcg/min (01/18/11 0428)    Assessment: 64yo female now subtherapeutic on heparin. She appears to be very sensitive to rate changes.  Will  give small bolus then increase 2 units/kg/hr. No bleeding noted.  No INR today. Hold warfarin per MD note.  Goal of Therapy:  Heparin level 0.3-0.7 units/ml; INR 2-3   Plan: 1) Rebolus heparin 1000 units x 1 then increase 1000 units/hr 2) Check 8 hr heparin level 3) Spoke with Dr Vassie Loll.  He will clarify if warfarin to be given today - no doses ordered for now.  Reordered daily INR.  Kaleab Frasier, [facility] PharmD BCPS 01/18/2011,3:35 PM

## 2011-01-18 NOTE — Progress Notes (Signed)
Patient feeling well with trach  PE RLE wounds healed and ROM of knee improving with 0-80 degrees  Plan  Please continue PT with ROM, strengthening, hip and ankle motion Call if questions

## 2011-01-18 NOTE — Progress Notes (Signed)
ANTICOAGULATION CONSULT NOTE - Follow Up Consult  Pharmacy Consult for heparin Indication: DVT  No Known Allergies  Patient Measurements: Height: 5' (152.4 cm) Weight: 102 lb 8.2 oz (46.5 kg) IBW/kg (Calculated) : 45.5   Vital Signs: Temp: 100.2 F (37.9 C) (12/22 0025) Temp src: Oral (12/22 0025) BP: 112/59 mmHg (12/21 2300) Pulse Rate: 72  (12/21 2300)  Labs:  Basename 01/17/11 2127 01/17/11 0930 01/17/11 0600 01/16/11 1916 01/16/11 1708 01/16/11 0655 01/15/11 1439 01/15/11 0410  HGB -- -- 8.0* 10.5* -- -- -- --  HCT -- -- 26.1* 34.9* -- -- -- 28.3*  PLT -- -- 234 367 -- -- -- 326  APTT -- -- -- -- -- -- -- --  LABPROT -- -- -- -- -- 16.0* -- 16.7*  INR -- -- -- -- -- 1.25 -- 1.33  HEPARINUNFRC 0.42 -- 0.39 -- 0.70 -- -- --  CREATININE -- -- 0.68 -- -- -- 0.53 0.55  CKTOTAL -- 50 -- -- -- -- -- --  CKMB -- 6.9* -- -- -- -- -- --  TROPONINI -- <0.30 -- -- -- -- -- --   Estimated Creatinine Clearance: 49 ml/min (by C-G formula based on Cr of 0.68).   Medications:  Scheduled:    . sodium chloride   Intravenous Once  . sodium chloride   Intravenous Once  . sodium chloride   Intravenous Once  . albuterol  2.5 mg Nebulization Once  . amiodarone  200 mg Oral Daily  . antiseptic oral rinse  15 mL Mouth Rinse 6 X Daily  . aspirin  81 mg Oral Daily  . chlorhexidine  15 mL Mouth Rinse BID  . ciprofloxacin  400 mg Intravenous Q12H  . clopidogrel  75 mg Oral Q breakfast  . cyanocobalamin  1,000 mcg Intramuscular Weekly  . etomidate  10 mg Intravenous Once  . fentaNYL      . levalbuterol  0.63 mg Nebulization TID  . midazolam      . midazolam  2 mg Intravenous Once  . naloxone      . pantoprazole (PROTONIX) IV  40 mg Intravenous Daily  . piperacillin-tazobactam  3.375 g Intravenous NOW  . piperacillin-tazobactam (ZOSYN)  IV  3.375 g Intravenous Q8H  . rosuvastatin  10 mg Oral Daily  . sodium chloride  1,000 mL Intravenous Once  . sodium chloride  1,000 mL Intravenous  Once  . sodium chloride      . vancomycin  1,000 mg Intravenous NOW  . vancomycin  1,000 mg Intravenous Q24H  . warfarin  7.5 mg Intravenous ONCE-1800  . DISCONTD: feeding supplement  1 Container Oral TID WC  . DISCONTD: ferrous sulfate  325 mg Oral BID WC  . DISCONTD: furosemide  40 mg Intravenous BID  . DISCONTD: lisinopril  5 mg Oral Daily  . DISCONTD: LORazepam  0.5 mg Intravenous NOW  . DISCONTD: metoprolol tartrate  12.5 mg Oral BID  . DISCONTD: potassium chloride  20 mEq Oral BID  . DISCONTD: warfarin  7.5 mg Oral ONCE-1800  . DISCONTD: warfarin  7.5 mg Oral ONCE-1800   Infusions:    . heparin 900 Units/hr (01/17/11 2000)  . phenylephrine (NEO-SYNEPHRINE) Adult infusion 30 mcg/min (01/17/11 1900)  . DISCONTD: fentaNYL infusion INTRAVENOUS Stopped (01/17/11 1239)  . DISCONTD: propofol Stopped (01/17/11 1239)    Assessment: 64yo female therapeutic on heparin after resumed s/p trach.  Goal of Therapy:  Heparin level 0.3-0.7 units/ml   Plan: Will continue heparin at current rate and  continue to monitor.  Colleen Can PharmD BCPS 01/18/2011,12:55 AM

## 2011-01-18 NOTE — Progress Notes (Signed)
Name: [name] MRN: [id number] DOB: [birth year]    LOS: 17  PCCM PROGRESS NOTE  History of Present Illness: 65 y/o F with PMHx of HTN suffered fall on 12/24/10 and LE pain. Initially taken to [facility] and diagnosed with R femoral DVT and started on anticoagulation. Pt had persistent pain and she had further imaging studies which showed R femoral fracture. Pt was transferred to [facility] for further management and was taken to the OR on 01/03/11 and underwent Open reduction and internal fixation of the right femur fracture. Post op suffered episode of Torsades and underwent 7-10 mins CPR. Subsequently developed AFRVR and hypotension. EKG demonstrated ST elevation on the inferior leads. Underwent cardiac cath 12/7. PCCM asked to assist with vent/CCM issues.  Extubated 01/10/11 and transferred out of 2300 12/18.  However on 12/20 developed acute onset shortness of breath, likely due to aspiration, re-intubated.  Lines / Drains: 12/07 L IJ CVL>>> 12/14 12/07 ETT>>> 12/10, 12/11 >>> 12/14 , 12/21 >> 12/21 12/21 perc trach (DF/KZ) >> 12/14 PICC >>>  12/15 Foley>>>  Cultures: 12/10 UC>>>neg 12/10 RC>>>neg  Antibiotics: 12/10 Vancomycin >>> 12/11  12/10 Ceftazidime (fever, purulent sputum, cloudy urine) >>> 12/18 12/21 vanc (HCAP vs. Asp PNA) >> 12/21 Zosyn (HCAP vs. Asp PNA) >> 12/21 Cipro (HCAP vs. 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 Severe left ventricular dysfunction with inferior apical and distal anterior hypokinesis of severe degree. The estimated ejection fraction is 25%.  12/08 CT head: No acute abnormalities  12/08 Echo: Systolic function was moderately to severely reduced. The estimated ejection fraction was in the range of 30% to  35%. There is akinesis of the apical myocardium. There is akinesis of the inferoposterior myocardium. There is hypokinesis of the lateral myocardium  12/11 Reintubated for sudden onset resp distress  12/11 CTA chest: No evidence of pulmonary embolus. Moderate bilateral effusions with significant consolidation and atelectasis in both lungs. Perihilar infiltration suggesting edema. Indeterminate mass or fluid collection in the right supraclavicular region measuring about 3 x 2.2 cm.  12/14 Extubated 12/20 Acute resp distress, transfer to 3300 12/21 Intubated for aspiration pneumonia  Overnight:  Intubated 12/21 for respiratory insufficiency, likely due to aspiration event. S/p bedside trach 12/21, well-tolerated Currently on PSV 10  Vital Signs: Temp:  [98.5 F (36.9 C)-100.6 F (38.1 C)] 100.2 F (37.9 C) (12/22 1116) Pulse Rate:  [66-86] 77  (12/22 1222) Resp:  [17-22] 22  (12/22 1222) BP: (77-119)/(43-72) 111/65 mmHg (12/22 1222) SpO2:  [100 %] 100 % (12/22 1222) FiO2 (%):  [29.9 %-100 %] 30 % (12/22 1222) Weight:  [47.9 kg (105 lb 9.6 oz)] 105 lb 9.6 oz (47.9 kg) (12/22 0500) I/O last 3 completed shifts: In: 7033.8 [I.V.:4163.8; NG/GT:120; IV Piggyback:2750] Out: 1910 [Urine:1910]  Physical Examination: Gen: confortable on PSV HEENT: NCAT, PERRL, Trach site CDI, some blood-tinged secretions PULM: CTA B on L, bronchial breath sounds on R CV: Tachycardic, no mgr, no JVD AB: BS+, soft, nontender, no hsm Ext: warm, no edema, no clubbing, no cyanosis; R PICC site dressing c/d/i Derm: no rash or skin breakdown Neuro: Delirious, does not follow commands, moves all four ext spontaneously   Ventilator settings: Vent Mode:  [-] CPAP FiO2 (%):  [29.9 %-100 %]  30 % Set Rate:  [18 bmp] 18 bmp Vt Set:  [400 mL] 400 mL PEEP:  [5 cmH20-5.1 cmH20] 5 cmH20 Pressure Support:  [10 cmH20] 10 cmH20 Plateau Pressure:  [15 cmH20-16 cmH20] 15 cmH20  Labs and Imaging:  Reviewed.  Please refer to  the Assessment and Plan section for relevant results.  Assessment and Plan:  Ischemic cardiomyopathy, MI, status post torsades cardiac arrest, atrial fibrillation with RVR, hypertension.  Status post bare metal stent placement. Cardiology following.   -->continue ASA, Paix (d/c after 30 days per Cardiology) -->continue Metoprolol, Lisinopril -->continue Crestor -->continue Amiodarone   (for 6-8 weeks per Cardiology) -->continue Lasix, decreased to 40 mg IV daily  Respiratory Failure: aspiration pneumonia vs. HCAP (WBC up, febrile), exam not consistent with volume overload. -->full vent  Support, transition to PSV as tolerated -->sent blood cultures and sputum culture -->vanc/zosyn/cipro  Hypokalemia  Lab 01/17/11 0600 01/15/11 1439 01/15/11 0410 01/14/11 1500 01/14/11 0445  K 3.1* 4.1 2.9* 2.9* 2.5*  --> replete  Hypophosphatemia -->replaced -->Phos AM  Right Lower Extremity DVT  -->Heparin drip -->hold Coumadin for now  Anemia, stable   Lab 01/17/11 0600 01/16/11 1916 01/15/11 0410 01/14/11 0445 01/13/11 0615  HCT 26.1* 34.9* 28.3* 26.7* 27.5*  -->monitor Hct  Malnutrition, dysphagia.  Swallowing assessment completed. -->place panda, re-assess swallow when tolerating PMV  Encephalopathy / delirium, improved -->monitor  Best practices / Disposition -->full code -->DVT Px therapeutic Heparin -->GI Px pepcid -->Diet - place panda  Levy Pupa, MD, PhD 01/18/2011, 12:39 PM [facility] [phone] or if no answer [phone]

## 2011-01-19 ENCOUNTER — Inpatient Hospital Stay (HOSPITAL_COMMUNITY): Payer: Medicare Other

## 2011-01-19 LAB — CBC
MCH: 27.5 pg (ref 26.0–34.0)
MCH: 27.9 pg (ref 26.0–34.0)
MCHC: 31.3 g/dL (ref 30.0–36.0)
MCHC: 31.9 g/dL (ref 30.0–36.0)
Platelets: 163 10*3/uL (ref 150–400)
Platelets: 175 10*3/uL (ref 150–400)
RDW: 19.2 % — ABNORMAL HIGH (ref 11.5–15.5)

## 2011-01-19 LAB — BASIC METABOLIC PANEL
BUN: 8 mg/dL (ref 6–23)
Calcium: 8.2 mg/dL — ABNORMAL LOW (ref 8.4–10.5)
GFR calc Af Amer: >90 mL/min (ref >90)
GFR calc non Af Amer: 88 mL/min — ABNORMAL LOW (ref >90)
Glucose, Bld: 94 mg/dL (ref 70–99)
Sodium: 138 mEq/L (ref 135–145)

## 2011-01-19 LAB — PROTIME-INR
INR: 2.89 — ABNORMAL HIGH (ref 0.00–1.49)
Prothrombin Time: 30.7 seconds — ABNORMAL HIGH (ref 11.6–15.2)

## 2011-01-19 LAB — CULTURE, RESPIRATORY W GRAM STAIN

## 2011-01-19 MED ORDER — POTASSIUM CHLORIDE 10 MEQ/50ML IV SOLN
INTRAVENOUS | Status: AC
  Administered 2011-01-19: 10 meq
  Filled 2011-01-19: qty 50

## 2011-01-19 MED ORDER — POTASSIUM CHLORIDE 10 MEQ/100ML IV SOLN
10.0000 meq | INTRAVENOUS | Status: AC
  Administered 2011-01-19 (×4): 10 meq via INTRAVENOUS
  Filled 2011-01-19 (×5): qty 100

## 2011-01-19 MED ORDER — PROPOFOL 10 MG/ML IV EMUL
5.0000 ug/kg/min | INTRAVENOUS | Status: DC
  Administered 2011-01-19: 5 ug/kg/min via INTRAVENOUS
  Administered 2011-01-20: 45 ug/kg/min via INTRAVENOUS
  Filled 2011-01-19 (×2): qty 100

## 2011-01-19 NOTE — Progress Notes (Signed)
Panda tube initially placed successfully in the stomach and confirmed by auscultation - however, pt removed it approximately 10 minutes later, while awaiting KUB confirmation.  Subsequent attempts at placement were unsuccessful.  E-link MD Vassie Loll aware - plan to await am CCM MD's decision on panda placement vs. swallow evaluation.

## 2011-01-19 NOTE — Progress Notes (Signed)
CRITICAL VALUE ALERT  Critical value received:  K+ 2.5   Date of notification:  01/08/11  Time of notification:  0240  Critical value read back:yes  Nurse who received alert:  Marthann Schiller  MD notified (1st page):  Dr. Darrick Penna  Time of first page:  0245  MD notified (2nd page):  Time of second page:  Responding MD:  Dr. Darrick Penna  Time MD responded:  0300

## 2011-01-19 NOTE — Progress Notes (Signed)
ANTICOAGULATION CONSULT NOTE - Follow Up Consult  Pharmacy Consult for heparin/Coumadin Indication: DVT  No Known Allergies  Patient Measurements: Height: 5' (152.4 cm) Weight: 113 lb 15.7 oz (51.7 kg) IBW/kg (Calculated) : 45.5   Vital Signs: Temp: 99.5 F (37.5 C) (12/23 1537) Temp src: Oral (12/23 1537) BP: 137/106 mmHg (12/23 1537) Pulse Rate: 98  (12/23 1537)  Labs:  Basename 01/19/11 1450 01/19/11 0500 01/19/11 0117 01/19/11 0115 01/18/11 0945 01/17/11 2127 01/17/11 0930 01/17/11 0600  HGB 7.2* 7.0* -- -- -- -- -- --  HCT 22.6* 22.4* -- -- -- -- -- 26.1*  PLT 175 163 -- -- -- -- -- 234  APTT -- -- -- -- -- -- -- --  LABPROT -- -- -- 30.7* -- -- -- --  INR -- -- -- 2.89* -- -- -- --  HEPARINUNFRC -- -- 0.36 -- 0.14* 0.42 -- --  CREATININE -- 0.69 -- -- -- -- -- 0.68  CKTOTAL -- -- -- -- -- -- 50 --  CKMB -- -- -- -- -- -- 6.9* --  TROPONINI -- -- -- -- -- -- <0.30 --   Estimated Creatinine Clearance: 49 ml/min (by C-G formula based on Cr of 0.69).   Medications:  Scheduled:     . albuterol  2.5 mg Nebulization Once  . amiodarone  200 mg Oral Daily  . antiseptic oral rinse  15 mL Mouth Rinse 6 X Daily  . aspirin  81 mg Oral Daily  . chlorhexidine  15 mL Mouth Rinse BID  . ciprofloxacin  400 mg Intravenous Q12H  . clopidogrel  75 mg Oral Q breakfast  . cyanocobalamin  1,000 mcg Intramuscular Weekly  . levalbuterol  0.63 mg Nebulization TID  . pantoprazole (PROTONIX) IV  40 mg Intravenous Daily  . piperacillin-tazobactam (ZOSYN)  IV  3.375 g Intravenous Q8H  . potassium chloride  10 mEq Intravenous Q1 Hr x 6  . potassium chloride      . potassium chloride      . rosuvastatin  10 mg Oral Daily  . vancomycin  1,000 mg Intravenous Q24H  . DISCONTD: famotidine (PEPCID) IV  20 mg Intravenous Q12H   Infusions:     . heparin Stopped (01/19/11 1736)  . phenylephrine (NEO-SYNEPHRINE) Adult infusion Stopped (01/18/11 1100)    Assessment: 65 yo female with  DVT on IV heparin. Hgb of 7.2 and patient with bleeding from new tracheostomy site per RN.   Goal of Therapy:  Heparin level 0.3-0.7 units/ml; INR 2-3   Plan: 1. Hold heparin per physician order due to bleeding from new tracheostomy site.  2. Will follow-up with RN when bleeding has stopped and will resume heparin at that time.     [name] 01/19/2011,5:41 PM

## 2011-01-19 NOTE — Progress Notes (Signed)
Contacted e-link MD Vassie Loll regarding pt's actively bleeding new tracheostomy site, most recent Hb levels and status of heparin gtt.  Dr. Vassie Loll advised to hold heparin gtt until bleeding stops, then to restart per pharmacy.  Advised pharmacy of same.  Will advise pharmacy when okay to resume heparin gtt.

## 2011-01-19 NOTE — Progress Notes (Signed)
ANTICOAGULATION CONSULT NOTE - Follow Up Consult  Pharmacy Consult for heparin/Coumadin Indication: DVT  No Known Allergies  Patient Measurements: Height: 5' (152.4 cm) Weight: 105 lb 9.6 oz (47.9 kg) IBW/kg (Calculated) : 45.5   Vital Signs: Temp: 99.1 F (37.3 C) (12/22 2349) Temp src: Oral (12/22 2349) BP: 95/55 mmHg (12/23 0200) Pulse Rate: 73  (12/23 0200)  Labs:  Basename 01/19/11 0500 01/19/11 0117 01/18/11 0945 01/17/11 2127 01/17/11 0930 01/17/11 0600 01/16/11 1916 01/16/11 0655  HGB 7.0* -- -- -- -- 8.0* -- --  HCT 22.4* -- -- -- -- 26.1* 34.9* --  PLT 163 -- -- -- -- 234 367 --  APTT -- -- -- -- -- -- -- --  LABPROT -- -- -- -- -- -- -- 16.0*  INR -- -- -- -- -- -- -- 1.25  HEPARINUNFRC -- 0.36 0.14* 0.42 -- -- -- --  CREATININE -- -- -- -- -- 0.68 -- --  CKTOTAL -- -- -- -- 50 -- -- --  CKMB -- -- -- -- 6.9* -- -- --  TROPONINI -- -- -- -- <0.30 -- -- --   Estimated Creatinine Clearance: 49 ml/min (by C-G formula based on Cr of 0.68).   Medications:  Scheduled:     . albuterol  2.5 mg Nebulization Once  . amiodarone  200 mg Oral Daily  . antiseptic oral rinse  15 mL Mouth Rinse 6 X Daily  . aspirin  81 mg Oral Daily  . chlorhexidine  15 mL Mouth Rinse BID  . ciprofloxacin  400 mg Intravenous Q12H  . clopidogrel  75 mg Oral Q breakfast  . cyanocobalamin  1,000 mcg Intramuscular Weekly  . famotidine (PEPCID) IV  20 mg Intravenous Q12H  . heparin  1,000 Units Intravenous Once  . levalbuterol  0.63 mg Nebulization TID  . pantoprazole (PROTONIX) IV  40 mg Intravenous Daily  . piperacillin-tazobactam (ZOSYN)  IV  3.375 g Intravenous Q8H  . rosuvastatin  10 mg Oral Daily  . vancomycin  1,000 mg Intravenous Q24H   Infusions:     . heparin 1,000 Units/hr (01/18/11 1527)  . phenylephrine (NEO-SYNEPHRINE) Adult infusion Stopped (01/18/11 1100)    Assessment: 64yo female now therapeutic on heparin. hgb and hct has dropped to 7.0/22.4  respectively. Goal of Therapy:  Heparin level 0.3-0.7 units/ml; INR 2-3   Plan: no rate changes at this time continue at 1000 units/hr   Janice Coffin PharmD BCPS 01/19/2011,2:14 AM

## 2011-01-19 NOTE — Progress Notes (Signed)
Name: [name] MRN: [id number] DOB: 03-11-1945    LOS: 18  PCCM PROGRESS NOTE  History of Present Illness: 65 y/o F with PMHx of HTN suffered fall on 12/24/10 and LE pain. Initially taken to [facility] and diagnosed with R femoral DVT and started on anticoagulation. Pt had persistent pain and she had further imaging studies which showed R femoral fracture. Pt was transferred to [facility] for further management and was taken to the OR on 01/03/11 and underwent Open reduction and internal fixation of the right femur fracture. Post op suffered episode of Torsades and underwent 7-10 mins CPR. Subsequently developed AFRVR and hypotension. EKG demonstrated ST elevation on the inferior leads. Underwent cardiac cath 12/7. PCCM asked to assist with vent/CCM issues.  Extubated 01/10/11 and transferred out of 2300 12/18.  However on 12/20 developed acute onset shortness of breath, likely due to aspiration, re-intubated.  Lines / Drains: 12/07 L IJ CVL>>> 12/14 12/07 ETT>>> 12/10, 12/11 >>> 12/14 , 12/21 >> 12/21 12/21 perc trach (DF/KZ) >> 12/14 PICC >>>  12/15 Foley>>>  Cultures: 12/10 UC>>>neg 12/10 RC>>>neg  Antibiotics: 12/10 Vancomycin >>> 12/11  12/10 Ceftazidime (fever, purulent sputum, cloudy urine) >>> 12/18 12/21 vanc (HCAP vs. Asp PNA) >> 12/21 Zosyn (HCAP vs. Asp PNA) >> 12/21 Cipro (HCAP vs. 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 Severe left ventricular dysfunction with inferior apical and distal anterior hypokinesis of severe degree. The estimated ejection fraction is 25%.  12/08 CT head: No acute abnormalities  12/08 Echo: Systolic function was moderately to severely reduced. The estimated ejection fraction was in the range of 30% to  35%. There is akinesis of the apical myocardium. There is akinesis of the inferoposterior myocardium. There is hypokinesis of the lateral myocardium  12/11 Reintubated for sudden onset resp distress  12/11 CTA chest: No evidence of pulmonary embolus. Moderate bilateral effusions with significant consolidation and atelectasis in both lungs. Perihilar infiltration suggesting edema. Indeterminate mass or fluid collection in the right supraclavicular region measuring about 3 x 2.2 cm.  12/14 Extubated 12/20 Acute resp distress, transfer to 3300 12/21 Intubated for aspiration pneumonia  Overnight:  Tolerated PSV 12/22, now on TC this am; nodding to questions; bleeding from trach site - several dressing changes last 24 hours; Panda was never placed  Vital Signs: Temp:  [98.6 F (37 C)-100.7 F (38.2 C)] 99.4 F (37.4 C) (12/23 0718) Pulse Rate:  [64-94] 94  (12/23 0805) Resp:  [7-24] 21  (12/23 0805) BP: (86-123)/(42-87) 123/87 mmHg (12/23 0805) SpO2:  [99 %-100 %] 100 % (12/23 0805) FiO2 (%):  [29.8 %-35 %] 35 % (12/23 0805) Weight:  [51.7 kg (113 lb 15.7 oz)] 113 lb 15.7 oz (51.7 kg) (12/23 0500) I/O last 3 completed shifts: In: 3556.8 [I.V.:1696.8; Other:460; IV Piggyback:1400] Out: 890 [Urine:890]  Physical Examination: Gen: confortable on TC, moving around, trying to talk HEENT: NCAT, PERRL, Trach site with slow ooze of bleeding PULM: CTA B on L, bronchial breath sounds on R CV: Tachycardic, no mgr, no JVD AB: BS+, soft, nontender, no hsm Ext: warm, no edema, no clubbing, no cyanosis; R PICC site dressing c/d/i Derm: no rash or skin breakdown Neuro: More appropriate, trying to communicate, moves all four ext spontaneously  Ventilator settings: Vent Mode:  [-] PRVC FiO2 (%):  [29.8 %-35 %] 35 % Set Rate:  [18 bmp] 18 bmp Vt Set:  [400 mL] 400 mL PEEP:  [4.7 cmH20-5.1 cmH20] 5.1 cmH20 Pressure Support:  [10 cmH20] 10 cmH20 Plateau Pressure:  [16 cmH20-18 cmH20] 16  cmH20  Labs and Imaging:  Reviewed.  Please refer to the Assessment and Plan section for relevant results.  Assessment and Plan:  Ischemic cardiomyopathy, MI, status post torsades cardiac arrest, atrial fibrillation with RVR, hypertension.  Status post bare metal stent placement. Cardiology following.   -->continue ASA, Plavix (d/c after 30 days per Cardiology) -->continue Metoprolol, Lisinopril -->continue Crestor -->continue Amiodarone   (for 6-8 weeks per Cardiology)  Respiratory Failure: aspiration pneumonia vs. HCAP (WBC up, febrile), exam not consistent with volume overload. -->TC as tolerated 12/23 -->sent blood cultures and sputum culture -->vanc/zosyn/cipro  Hypokalemia  Lab 01/19/11 0500 01/17/11 0600 01/15/11 1439 01/15/11 0410 01/14/11 1500  K 2.5* 3.1* 4.1 2.9* 2.9*  --> replete  Hypophosphatemia -->replaced  Right Lower Extremity DVT  -->Heparin drip -->hold Coumadin for now until trach bleeding resolves  Anemia, bleeding from trach site on plavix, heparin, ASA  Lab 01/19/11 0500 01/17/11 0600 01/16/11 1916 01/15/11 0410 01/14/11 0445  HCT 22.4* 26.1* 34.9* 28.3* 26.7*  -->check CBC and type/screen this pm -->PRBC for Hgb < 7.0  Malnutrition, dysphagia.  Swallowing assessment completed. -->place panda, re-assess swallow when tolerating PMV  Encephalopathy / delirium, improved -->monitor  Best practices / Disposition -->full code -->DVT Px therapeutic Heparin -->GI Px protonix -->Diet - place panda  Levy Pupa, MD, PhD 01/19/2011, 9:07 AM [facility] [phone] or if no answer [phone]

## 2011-01-19 NOTE — Progress Notes (Signed)
eLink Physician-Brief Progress Note Patient Name: [name] DOB: 1945-07-22 MRN: [id number]  Date of Service  01/19/2011   HPI/Events of Note   Hypokalemia  eICU Interventions  Potassium replaced   Intervention Category Intermediate Interventions: Electrolyte abnormality - evaluation and management  Sharmaine Bain 01/19/2011, 3:11 AM

## 2011-01-20 ENCOUNTER — Inpatient Hospital Stay (HOSPITAL_COMMUNITY): Payer: Medicare Other

## 2011-01-20 LAB — CBC
MCH: 26.8 pg (ref 26.0–34.0)
MCHC: 33.1 g/dL (ref 30.0–36.0)
Platelets: 127 10*3/uL — ABNORMAL LOW (ref 150–400)
Platelets: 143 10*3/uL — ABNORMAL LOW (ref 150–400)
Platelets: 146 10*3/uL — ABNORMAL LOW (ref 150–400)
RBC: 2.07 MIL/uL — ABNORMAL LOW (ref 3.87–5.11)
RBC: 1.87 MIL/uL — ABNORMAL LOW (ref 3.87–5.11)
RDW: 19.1 % — ABNORMAL HIGH (ref 11.5–15.5)
WBC: 6.8 10*3/uL (ref 4.0–10.5)
WBC: 5.3 10*3/uL (ref 4.0–10.5)

## 2011-01-20 LAB — BASIC METABOLIC PANEL
Chloride: 110 mEq/L (ref 96–112)
Creatinine, Ser: 0.66 mg/dL (ref 0.50–1.10)
GFR calc Af Amer: >90 mL/min (ref >90)
Potassium: 2.4 mEq/L — CL (ref 3.5–5.1)
Sodium: 139 mEq/L (ref 135–145)

## 2011-01-20 MED ORDER — POTASSIUM CHLORIDE 10 MEQ/100ML IV SOLN
INTRAVENOUS | Status: AC
  Filled 2011-01-20: qty 100

## 2011-01-20 MED ORDER — POTASSIUM CHLORIDE 10 MEQ/100ML IV SOLN
10.0000 meq | INTRAVENOUS | Status: AC
  Administered 2011-01-20 (×6): 10 meq via INTRAVENOUS
  Filled 2011-01-20 (×5): qty 100

## 2011-01-20 MED ORDER — FUROSEMIDE 10 MG/ML IJ SOLN
20.0000 mg | Freq: Once | INTRAMUSCULAR | Status: AC
  Administered 2011-01-20: 20 mg via INTRAVENOUS
  Filled 2011-01-20: qty 2

## 2011-01-20 MED ORDER — MIDAZOLAM HCL 2 MG/2ML IJ SOLN
1.0000 mg | INTRAMUSCULAR | Status: DC | PRN
  Administered 2011-01-20 (×2): 1 mg via INTRAVENOUS
  Filled 2011-01-20 (×2): qty 2

## 2011-01-20 MED ORDER — HEPARIN SOD (PORCINE) IN D5W 100 UNIT/ML IV SOLN
1000.0000 [IU]/h | INTRAVENOUS | Status: DC
  Administered 2011-01-20: 1000 [IU]/h via INTRAVENOUS
  Filled 2011-01-20: qty 250

## 2011-01-20 MED ORDER — FENTANYL CITRATE 0.05 MG/ML IJ SOLN
25.0000 ug | INTRAMUSCULAR | Status: DC | PRN
  Administered 2011-01-20: 25 ug via INTRAVENOUS
  Filled 2011-01-20: qty 2

## 2011-01-20 MED ORDER — POTASSIUM CHLORIDE 10 MEQ/100ML IV SOLN
10.0000 meq | INTRAVENOUS | Status: AC
  Administered 2011-01-20 (×2): 10 meq via INTRAVENOUS
  Filled 2011-01-20: qty 100

## 2011-01-20 NOTE — Progress Notes (Signed)
eLink Physician-Brief Progress Note Patient Name: [name] DOB: 1945-11-26 MRN: [id number]  Date of Service  01/20/2011   HPI/Events of Note  Drop in Hgb to 5.3 from 7.2.  HD stable.  S/P trach placement   eICU Interventions  Plan: Transfuse 2 units of PRBCs Replete K Lasix between transfsions Monitor   Intervention Category Intermediate Interventions: Bleeding - evaluation and treatment with blood products  DETERDING,ELIZABETH 01/20/2011, 6:00 AM

## 2011-01-20 NOTE — Progress Notes (Addendum)
Pt's sedation reduced by 50%.  Patient awake, alert, oriented to person. PERRL, brisk.  Pt agitated; attempting to pull PICC line and pick and arm bands and skin.  Sedation back to 15.  Will continue to monitor.

## 2011-01-20 NOTE — Progress Notes (Addendum)
MEDICATION RELATED CONSULT NOTE - FOLLOW UP   Pharmacy Consult for Heparin/Coumadin and Vancomycin/Zosyn/Cipro Indication: DVT and HCAP vs aspiration PNA  No Known Allergies  Patient Measurements: Height: 5' (152.4 cm) Weight: 114 lb 3.2 oz (51.8 kg) IBW/kg (Calculated) : 45.5  Adjusted Body Weight:   Vital Signs: Temp: 98.7 F (37.1 C) (12/24 1015) Temp src: Oral (12/24 0746) BP: 99/58 mmHg (12/24 1015) Pulse Rate: 86  (12/24 1015) Intake/Output from previous day: 12/23 0701 - 12/24 0700 In: 1483.6 [I.V.:523.6; IV Piggyback:960] Out: 1210 [Urine:1210] Intake/Output from this shift: Total I/O In: 450 [Blood:237.5; IV Piggyback:212.5] Out: -   Labs:  Basename 01/20/11 0500 01/20/11 0442 01/19/11 1450 01/19/11 0500  WBC 5.3 6.8 7.1 --  HGB 5.3* 5.8* 7.2* --  HCT 16.5* 18.1* 22.6* --  PLT 127* 143* 175 --  APTT -- -- -- --  CREATININE -- 0.66 -- 0.69  LABCREA -- -- -- --  CREATININE -- 0.66 -- 0.69  CREAT24HRUR -- -- -- --  MG -- -- -- --  PHOS -- -- -- --  ALBUMIN -- -- -- --  PROT -- -- -- --  ALBUMIN -- -- -- --  AST -- -- -- --  ALT -- -- -- --  ALKPHOS -- -- -- --  BILITOT -- -- -- --  BILIDIR -- -- -- --  IBILI -- -- -- --   Estimated Creatinine Clearance: 49 ml/min (by C-G formula based on Cr of 0.66).   Microbiology: Recent Results (from the past 720 hour(s))  URINE CULTURE     Status: Normal   Collection Time   12/27/10  3:43 PM      Component Value Range Status Comment   Specimen Description URINE, CATHETERIZED   Final    Special Requests NONE   Final    Setup Time [id number]   Final    Colony Count NO GROWTH   Final    Culture NO GROWTH   Final    Report Status 12/29/2010 FINAL   Final   CULTURE, BLOOD (ROUTINE X 2)     Status: Normal   Collection Time   12/28/10 12:27 AM      Component Value Range Status Comment   Specimen Description BLOOD RIGHT HAND   Final    Special Requests BOTTLES DRAWN AEROBIC AND ANAEROBIC 6CC   Final    Culture NO GROWTH 5 DAYS   Final    Report Status 01/02/2011 FINAL   Final   CULTURE, BLOOD (ROUTINE X 2)     Status: Normal   Collection Time   12/28/10 12:31 AM      Component Value Range Status Comment   Specimen Description BLOOD LEFT ARM   Final    Special Requests BOTTLES DRAWN AEROBIC AND ANAEROBIC 6CC   Final    Culture NO GROWTH 5 DAYS   Final    Report Status 01/02/2011 FINAL   Final   MRSA PCR SCREENING     Status: Normal   Collection Time   01/02/11  7:46 PM      Component Value Range Status Comment   MRSA by PCR NEGATIVE  NEGATIVE  Final   CULTURE, RESPIRATORY     Status: Normal   Collection Time   01/06/11 12:05 PM      Component Value Range Status Comment   Specimen Description ENDOTRACHEAL ASPIRATE   Final    Special Requests NONE   Final    Gram Stain     Final  Value: FEW WBC PRESENT,BOTH PMN AND MONONUCLEAR     NO SQUAMOUS EPITHELIAL CELLS SEEN     NO ORGANISMS SEEN   Culture Non-Pathogenic Oropharyngeal-type Flora Isolated.   Final    Report Status 01/09/2011 FINAL   Final   MRSA PCR SCREENING     Status: Normal   Collection Time   01/16/11 10:03 PM      Component Value Range Status Comment   MRSA by PCR NEGATIVE  NEGATIVE  Final   CULTURE, RESPIRATORY     Status: Normal   Collection Time   01/17/11  1:01 AM      Component Value Range Status Comment   Specimen Description TRACHEAL ASPIRATE   Final    Special Requests NONE   Final    Gram Stain     Final    Value: ABUNDANT WBC PRESENT, PREDOMINANTLY PMN     FEW SQUAMOUS EPITHELIAL CELLS PRESENT     RARE GRAM POSITIVE RODS     RARE GRAM POSITIVE COCCI     IN PAIRS RARE GRAM NEGATIVE RODS   Culture Non-Pathogenic Oropharyngeal-type Flora Isolated.   Final    Report Status 01/19/2011 FINAL   Final   CULTURE, BLOOD (ROUTINE X 2)     Status: Normal (Preliminary result)   Collection Time   01/17/11  2:17 AM      Component Value Range Status Comment   Specimen Description BLOOD LEFT ARM   Final     Special Requests BOTTLES DRAWN AEROBIC ONLY 10CC   Final    Setup Time [id number]   Final    Culture     Final    Value:        BLOOD CULTURE RECEIVED NO GROWTH TO DATE CULTURE WILL BE HELD FOR 5 DAYS BEFORE ISSUING A FINAL NEGATIVE REPORT   Report Status PENDING   Incomplete   CULTURE, BLOOD (ROUTINE X 2)     Status: Normal (Preliminary result)   Collection Time   01/17/11  2:22 AM      Component Value Range Status Comment   Specimen Description BLOOD LEFT HAND   Final    Special Requests BOTTLES DRAWN AEROBIC ONLY 4CC   Final    Setup Time [id number]   Final    Culture     Final    Value:        BLOOD CULTURE RECEIVED NO GROWTH TO DATE CULTURE WILL BE HELD FOR 5 DAYS BEFORE ISSUING A FINAL NEGATIVE REPORT   Report Status PENDING   Incomplete     Medications:  Scheduled:    . albuterol  2.5 mg Nebulization Once  . amiodarone  200 mg Oral Daily  . antiseptic oral rinse  15 mL Mouth Rinse 6 X Daily  . aspirin  81 mg Oral Daily  . chlorhexidine  15 mL Mouth Rinse BID  . ciprofloxacin  400 mg Intravenous Q12H  . clopidogrel  75 mg Oral Q breakfast  . cyanocobalamin  1,000 mcg Intramuscular Weekly  . furosemide  20 mg Intravenous Once  . levalbuterol  0.63 mg Nebulization TID  . pantoprazole (PROTONIX) IV  40 mg Intravenous Daily  . piperacillin-tazobactam (ZOSYN)  IV  3.375 g Intravenous Q8H  . potassium chloride  10 mEq Intravenous Q1 Hr x 6  . potassium chloride  10 mEq Intravenous Q1 Hr x 2  . rosuvastatin  10 mg Oral Daily  . vancomycin  1,000 mg Intravenous Q24H   Pharmacy System-Based Medication Review:  Anticoagulation: Hep/Warf for +DVT on hold d/t bleeding from trach site and decrease in H/H. Ordered 2 units PRBC today for Hb 5.3.  Infectious Disease: Vanc/Zosyn/Cipro Day#4 for probable asp PNA vs HCAP (s/p fortaz for PNA x9days completed 12/18). Tm 100.6. Trach asp NEG, BCx NGTD. WBC wnl. Renal fxn stable.  Neurology: Encephalopathy/delirium- improved; PRN  fent/midazolam  Pulmonary: Trach collar. On Xopenex tid  Cardiovascular: ICM (EF30-35%), CAD, Afib, HTN. S/p bare metal stent this admit. On ASA, plavix (x30days), crestor, amio (x6-8wks). MAP 62, HR 60s, NSR. Metoprolol/lisinopril on hold  Endocrinology: Glucose CBGs <180. No meds  Fluids/Electrolytes/Nutrition: currently NPO, K 2.4 -repleting  Nephrology: Renal function stable, good UOP.  Hematology: Hb 5.3. Has had bleeding from trach site. PRBCs for Hg<7.  PTA Medication Problems: f/u resume home meds when able  Best practices: Protonix IV for SUP; CHX/antiseptic rinse for mouthcare  Assessment: [name] on day 4 Vancomycin and Zosyn for HCAP vs aspiration PNA. Heparin and Coumadin currently being held for bleeding at trach site and drop in Hb.  Goal of Therapy:  Vancomycin trough 15-20 Heparin level 0.3-0.7 INR 2-3  Plan:  1. Continue vancomycin 1000 mg IV q24h 2. Continue Zosyn 3.375 g IV q8h (infuse over 4 hrs) 3. Continue Cipro 400 mg IV q12h 4. Anticoagulation on hold. Please let pharmacy know when it is appropriate to resume. Consider SCDs while heparin is off.  Loura Back Danielle 01/20/2011,11:02 AM

## 2011-01-20 NOTE — Progress Notes (Signed)
ANTICOAGULATION CONSULT NOTE - Follow Up Consult  Pharmacy Consult for heparin Indication: DVT  No Known Allergies  Patient Measurements: Height: 5' (152.4 cm) Weight: 113 lb 15.7 oz (51.7 kg) IBW/kg (Calculated) : 45.5   Vital Signs: Temp: 99.1 F (37.3 C) (12/23 2351) Temp src: Oral (12/23 2351) BP: 84/41 mmHg (12/24 0100) Pulse Rate: 82  (12/24 0100)  Labs:  Basename 01/19/11 1450 01/19/11 0500 01/19/11 0117 01/19/11 0115 01/18/11 0945 01/17/11 2127 01/17/11 0930 01/17/11 0600  HGB 7.2* 7.0* -- -- -- -- -- --  HCT 22.6* 22.4* -- -- -- -- -- 26.1*  PLT 175 163 -- -- -- -- -- 234  APTT -- -- -- -- -- -- -- --  LABPROT -- -- -- 30.7* -- -- -- --  INR -- -- -- 2.89* -- -- -- --  HEPARINUNFRC -- -- 0.36 -- 0.14* 0.42 -- --  CREATININE -- 0.69 -- -- -- -- -- 0.68  CKTOTAL -- -- -- -- -- -- 50 --  CKMB -- -- -- -- -- -- 6.9* --  TROPONINI -- -- -- -- -- -- <0.30 --   Estimated Creatinine Clearance: 49 ml/min (by C-G formula based on Cr of 0.69).   Medications:  Scheduled:    . albuterol  2.5 mg Nebulization Once  . amiodarone  200 mg Oral Daily  . antiseptic oral rinse  15 mL Mouth Rinse 6 X Daily  . aspirin  81 mg Oral Daily  . chlorhexidine  15 mL Mouth Rinse BID  . ciprofloxacin  400 mg Intravenous Q12H  . clopidogrel  75 mg Oral Q breakfast  . cyanocobalamin  1,000 mcg Intramuscular Weekly  . levalbuterol  0.63 mg Nebulization TID  . pantoprazole (PROTONIX) IV  40 mg Intravenous Daily  . piperacillin-tazobactam (ZOSYN)  IV  3.375 g Intravenous Q8H  . potassium chloride  10 mEq Intravenous Q1 Hr x 6  . potassium chloride      . potassium chloride      . rosuvastatin  10 mg Oral Daily  . vancomycin  1,000 mg Intravenous Q24H  . DISCONTD: famotidine (PEPCID) IV  20 mg Intravenous Q12H   Infusions:    . heparin    . phenylephrine (NEO-SYNEPHRINE) Adult infusion Stopped (01/18/11 1100)  . propofol 25 mcg/kg/min (01/19/11 2300)  . DISCONTD: heparin Stopped  (01/19/11 1736)    Assessment: 65yo female on heparin for DVT, had been placed on hold for bleeding/oozing at trach site, now resolved, to resume heparin.  Had been at low end of goal at 1000 units/hr previously.  Goal of Therapy:  Heparin level 0.3-0.7 units/ml   Plan:  Will resume heparin gtt at 1000 units/hr and check level in ~7-8hr.  Monitor for further episodes of bleeding.  Colleen Can PharmD BCPS 01/20/2011,1:28 AM

## 2011-01-20 NOTE — Progress Notes (Signed)
CRITICAL VALUE ALERT  Critical value received:  K+ 2.4  Date of notification:  01/20/11  Time of notification:  0536  Critical value read back:yes  Nurse who received alert:  Marthann Schiller  MD notified (1st page):  Dr. Darrick Penna  Time of first page:  [phone]  MD notified (2nd page):  Time of second page:  Responding MD:  Dr. Darrick Penna  Time MD responded:  [phone]  Orders to follow

## 2011-01-20 NOTE — Progress Notes (Signed)
CRITICAL VALUE ALERT  Critical value received:  Hgb 5.8  Date of notification:  01/20/11  Time of notification:  0515  Critical value read back:yes  Nurse who received alert:  Marthann Schiller  MD notified (1st page):  Dr. Darrick Penna  Time of first page:  0518  MD notified (2nd page):  Time of second page:  Responding MD:  Dr. Darrick Penna  Time MD responded:  [phone]  Repeat CBC

## 2011-01-20 NOTE — Progress Notes (Signed)
Speech Pathology  PMSV (Passy-Muir Speaking Valve) Evaluation  I. HPI:  65 y/o F with PMHx of HTN suffered fall on 12/24/10 and LE pain. Initially taken to [facility] and diagnosed with R femoral DVT and started on anticoagulation. Pt had persistent pain and she had further imaging studies which showed R femoral fracture. Pt was transferred to [facility] for further management and was taken to the OR on 01/03/11 and underwent Open reduction and internal fixation of the right femur fracture. Post op suffered episode of Torsades and underwent 7-10 mins CPR. Subsequently developed AFRVR and hypotension. EKG demonstrated ST elevation on the inferior leads. Underwent cardiac cath 12/7, extubated 01/10/11 and transferred out of 2300 12/18.  However on 12/20 developed acute onset shortness of breath, likely due to aspiration, re-intubated and a tracheostomy tube placed on 12/21.  Patient has since weaned from ventilator and is tolerating trach collar with thick, bloody, tan secretions.  II. Tracheostomy Tube  Initial trach Placement date: 01/17/11  Trach collar period: 2 days Type: Shiley       Size: 6   FiO2: 28% Cuff: yes Fenestration:  no Secretion description: thick, tan/bloody Frequency of tracheal suctioning:3 per hourly Level of secretion expectoration: tracheostomy inner cannula with maximum verbal encouragement to expectorate out of trachea.  IV. Cuff Deflation Trials  yes for 3  minutes  V. PMSV Trial PMSV was placed for 1 minutes. Able to redirect subglottic air through upper airway: no Able to attain phonation with PMSV: no Able to expectorate secretions with PMSV: no Breath Support for phonation: n/a Intelligibiity: n/a RR: 18-29 SpO2: 98-100% HR:80's Behavior: anxious  VI. Clinical Impression: Poor ability to redirect subglottic air through upper airway likely due to multiple factors including presence of thick secretions that patient is unable to fully mobilize, with the addition of the presence  of a deflated cuff in tracheal space as well as overall compromised respiratory status.  Project patient will be better able to redirect air when secretions diminish and she build respiratory endurance.  Therapy Diagnosis: Communication impairment  VII. Recommendations MD- Consider changing tracheostomy tube to a cuffless when secretions diminish.  1. Patient to use PMSV with SLP only. 2. Encourage patient to expectorate in order to reduce need for invasive tracheal suctioning. 3. Oral care QID.  VIII. Treatment Plan  yes Frequency/Duration: 3x/week x 2weeks Short Term Goals 1. Patient will redirect subglottic air through upper airway in order to phonate for 3-5 seconds with maximum verbal/positional support/cues. 2. Patient will expectorate secretions via trach with cuff deflated with no tracheal suctioning. 3. Patient will maintain VS without desaturation or Co2 trapping with PMSV in place for 60 seconds with verbal cues to fully exhale through upper airway.  Pain: no  Myra Rude, M.S.,CCC-SLP Pager [phone]

## 2011-01-20 NOTE — Progress Notes (Signed)
Speech Language Pathology Received order for bedsides swallow evaluation today.  Patient is familiar to this SLP prior to tracheostomy placement.  Dr. Molli Knock agreed with plan to defer a swallow assessment, via an objective measure (FEES vs. MBSS) for 2-3 days as patient will very likely not be able to initiate a safe PO diet due to multiple medical/clinical indicators (compromised respiratory system, new trach and unable to redirect air with PMSV, as well as prior dysphagia).  Plan:  1. Continue PMSV trials 2. Objective swallow assessment (FEES vs. MBSS) in 2-3 days to optimize respiratory recovery for best outcome.  Myra Rude, M.S.,CCC-SLP Pager [phone]

## 2011-01-20 NOTE — Progress Notes (Signed)
Name: [name] MRN: [id number] DOB: 1945/08/27    LOS: 19  PCCM PROGRESS NOTE  History of Present Illness: 65 y/o F with PMHx of HTN suffered fall on 12/24/10 and LE pain. Initially taken to [facility] and diagnosed with R femoral DVT and started on anticoagulation. Pt had persistent pain and she had further imaging studies which showed R femoral fracture. Pt was transferred to [facility] for further management and was taken to the OR on 01/03/11 and underwent Open reduction and internal fixation of the right femur fracture. Post op suffered episode of Torsades and underwent 7-10 mins CPR. Subsequently developed AFRVR and hypotension. EKG demonstrated ST elevation on the inferior leads. Underwent cardiac cath 12/7. PCCM asked to assist with vent/CCM issues.  Extubated 01/10/11 and transferred out of 2300 12/18.  However on 12/20 developed acute onset shortness of breath, likely due to aspiration, re-intubated.  Lines / Drains: 12/07 L IJ CVL>>> 12/14 12/07 ETT>>> 12/10, 12/11 >>> 12/14 , 12/21 >> 12/21 12/21 perc trach (DF/KZ) >> 12/14 PICC >>>  12/15 Foley>>>  Cultures: 12/10 UC>>>neg 12/10 RC>>>neg 12/21 Sputum>>>NTD. 12/21 blood>>>NTD.  Antibiotics: 12/10 Vancomycin >>> 12/11  12/10 Ceftazidime (fever, purulent sputum, cloudy urine) >>> 12/18 12/21 vanc (HCAP vs. Asp PNA) >> 12/21 Zosyn (HCAP vs. Asp PNA) >> 12/21 Cipro (HCAP vs. 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 Severe left ventricular dysfunction with inferior apical and distal anterior hypokinesis of severe degree. The estimated ejection fraction is 25%.  12/08 CT head: No acute abnormalities  12/08 Echo: Systolic function was moderately to severely reduced. The estimated  ejection fraction was in the range of 30% to 35%. There is akinesis of the apical myocardium. There is akinesis of the inferoposterior myocardium. There is hypokinesis of the lateral myocardium  12/11 Reintubated for sudden onset resp distress  12/11 CTA chest: No evidence of pulmonary embolus. Moderate bilateral effusions with significant consolidation and atelectasis in both lungs. Perihilar infiltration suggesting edema. Indeterminate mass or fluid collection in the right supraclavicular region measuring about 3 x 2.2 cm.  12/14 Extubated 12/20 Acute resp distress, transfer to 3300 12/21 Intubated for aspiration pneumonia  Overnight:  Tolerated PSV 12/22, now on TC this am; nodding to questions; bleeding from trach site - several dressing changes last 24 hours; Panda was never placed  Vital Signs: Temp:  [97.5 F (36.4 C)-101.3 F (38.5 C)] 98.4 F (36.9 C) (12/24 0830) Pulse Rate:  [78-126] 82  (12/24 0915) Resp:  [0-43] 14  (12/24 0915) BP: (79-180)/(39-106) 93/48 mmHg (12/24 0915) SpO2:  [96 %-100 %] 100 % (12/24 0700) FiO2 (%):  [28 %-100 %] 28 % (12/24 0823) Weight:  [51.8 kg (114 lb 3.2 oz)] 114 lb 3.2 oz (51.8 kg) (12/24 0500)   Intake/Output Summary (Last 24 hours) at 01/20/11 0926 Last data filed at 01/20/11 0900  Gross per 24 hour  Intake 1681.09 ml  Output   1180 ml  Net 501.09 ml    Physical Examination: Gen: confortable on TC, moving around, trying to talk HEENT: NCAT, PERRL, Trach site with slow ooze of bleeding PULM: CTA B on L, bronchial breath sounds on R CV: Tachycardic, no mgr, no JVD AB: BS+, soft, nontender, no hsm Ext: warm, no  edema, no clubbing, no cyanosis; R PICC site dressing c/d/i Derm: no rash or skin breakdown Neuro: More appropriate, trying to communicate, moves all four ext spontaneously  BMET    Component Value Date/Time   NA 139 01/20/2011 0442   K 2.4* 01/20/2011 0442   CL 110 01/20/2011 0442   CO2 23 01/20/2011 0442   GLUCOSE 117*  01/20/2011 0442   BUN 5* 01/20/2011 0442   CREATININE 0.66 01/20/2011 0442   CALCIUM 7.9* 01/20/2011 0442   GFRNONAA 89* 01/20/2011 0442   GFRAA >90 01/20/2011 0442   CBC    Component Value Date/Time   WBC 5.3 01/20/2011 0500   RBC 1.87* 01/20/2011 0500   HGB 5.3* 01/20/2011 0500   HCT 16.5* 01/20/2011 0500   PLT 127* 01/20/2011 0500   MCV 88.2 01/20/2011 0500   MCH 28.3 01/20/2011 0500   MCHC 32.1 01/20/2011 0500   RDW 19.5* 01/20/2011 0500   LYMPHSABS 1.9 01/13/2011 0615   MONOABS 0.9 01/13/2011 0615   EOSABS 0.0 01/13/2011 0615   BASOSABS 0.0 01/13/2011 0615   Ventilator settings: Vent Mode:  [-]  FiO2 (%):  [28 %-100 %] 28 %  Labs and Imaging:  Reviewed.  Please refer to the Assessment and Plan section for relevant results.  Assessment and Plan:  Ischemic cardiomyopathy, MI, status post torsades cardiac arrest, atrial fibrillation with RVR, hypertension.  Status post bare metal stent placement. Cardiology following.   - Continue ASA, Plavix (d/c after 30 days per Cardiology). - Continue Metoprolol, Lisinopril. - Continue Crestor. - Continue Amiodarone (for 6-8 weeks per Cardiology).  Respiratory Failure: aspiration pneumonia vs. HCAP (WBC up, febrile), exam not consistent with volume overload. - TC as tolerated. - Sent blood cultures and sputum culture all NTD. - Vanc/zosyn/cipro for now, will not narrow while still with borderline BP.  Hypokalemia Lab 01/20/11 0442 01/19/11 0500 01/17/11 0600 01/15/11 1439 01/15/11 0410  K 2.4* 2.5* 3.1* 4.1 2.9*  - Replete and recheck.  Hypophosphatemia - Replaced, will recheck.  Right Lower Extremity DVT  - Heparin drip on hold due to drop in Hg, will recheck H/H post transfusion. - Hold Coumadin for now until trach bleeding resolves.  Anemia, bleeding from trach site on plavix, heparin, ASA  Lab 01/20/11 0500 01/20/11 0442 01/19/11 1450 01/19/11 0500 01/17/11 0600  HCT 16.5* 18.1* 22.6* 22.4* 26.1*  - Check CBC and  type/screen this pm. - PRBC for Hgb < 7.0, will recheck H/H post transfusion.  Malnutrition, dysphagia.  Swallowing assessment completed. - Place panda, re-assess swallow when tolerating PMV.  Encephalopathy / delirium, improved - Monitor  Best practices / Disposition - Full code - DVT Px therapeutic Heparin - GI Px protonix - Diet - swallow evaluation today, if fails will re-insert panda.  Koren Bound, M.D. [phone]

## 2011-01-20 NOTE — Progress Notes (Signed)
eLink Physician-Brief Progress Note Patient Name: Avneet Ashmore DOB: 1945/08/23 MRN: [id number]  Date of Service  01/20/2011   HPI/Events of Note   Hypokalemia  eICU Interventions  Potassium replaced   Intervention Category Intermediate Interventions: Electrolyte abnormality - evaluation and management  DETERDING,ELIZABETH 01/20/2011, 5:42 AM

## 2011-01-21 DIAGNOSIS — R579 Shock, unspecified: Secondary | ICD-10-CM

## 2011-01-21 DIAGNOSIS — I255 Ischemic cardiomyopathy: Secondary | ICD-10-CM

## 2011-01-21 DIAGNOSIS — J9600 Acute respiratory failure, unspecified whether with hypoxia or hypercapnia: Secondary | ICD-10-CM

## 2011-01-21 DIAGNOSIS — I469 Cardiac arrest, cause unspecified: Secondary | ICD-10-CM

## 2011-01-21 LAB — TYPE AND SCREEN
ABO/RH(D): O NEG
Unit division: 0

## 2011-01-21 LAB — PROTIME-INR
INR: 2.79 — ABNORMAL HIGH (ref 0.00–1.49)
Prothrombin Time: 29.9 seconds — ABNORMAL HIGH (ref 11.6–15.2)

## 2011-01-21 LAB — BASIC METABOLIC PANEL
BUN: 5 mg/dL — ABNORMAL LOW (ref 6–23)
Calcium: 8.3 mg/dL — ABNORMAL LOW (ref 8.4–10.5)
Chloride: 108 mEq/L (ref 96–112)
Creatinine, Ser: 0.73 mg/dL (ref 0.50–1.10)
Creatinine, Ser: 0.68 mg/dL (ref 0.50–1.10)
GFR calc Af Amer: >90 mL/min (ref >90)
GFR calc Af Amer: >90 mL/min (ref >90)
GFR calc non Af Amer: 89 mL/min — ABNORMAL LOW (ref >90)
Sodium: 141 mEq/L (ref 135–145)

## 2011-01-21 LAB — CBC
HCT: 27.5 % — ABNORMAL LOW (ref 36.0–46.0)
Hemoglobin: 9 g/dL — ABNORMAL LOW (ref 12.0–15.0)
MCHC: 32.7 g/dL (ref 30.0–36.0)
RDW: 19.5 % — ABNORMAL HIGH (ref 11.5–15.5)
WBC: 7.1 10*3/uL (ref 4.0–10.5)

## 2011-01-21 LAB — PHOSPHORUS: Phosphorus: 2.8 mg/dL (ref 2.3–4.6)

## 2011-01-21 LAB — MAGNESIUM: Magnesium: 1.7 mg/dL (ref 1.5–2.5)

## 2011-01-21 MED ORDER — POTASSIUM CHLORIDE 10 MEQ/100ML IV SOLN: 10.0000 meq | INTRAVENOUS | Status: DC

## 2011-01-21 MED ORDER — LISINOPRIL 2.5 MG PO TABS
2.5000 mg | ORAL_TABLET | Freq: Every day | ORAL | Status: DC
  Administered 2011-01-21 – 2011-01-28 (×7): 2.5 mg via ORAL
  Filled 2011-01-21 (×8): qty 1

## 2011-01-21 MED ORDER — POTASSIUM CHLORIDE 10 MEQ/50ML IV SOLN
10.0000 meq | INTRAVENOUS | Status: AC
  Administered 2011-01-21 (×4): 10 meq via INTRAVENOUS
  Filled 2011-01-21 (×4): qty 50

## 2011-01-21 MED ORDER — JEVITY 1.2 CAL PO LIQD
1000.0000 mL | ORAL | Status: DC
  Administered 2011-01-21 – 2011-02-03 (×9): 1000 mL
  Filled 2011-01-21 (×17): qty 1000

## 2011-01-21 MED ORDER — POTASSIUM CHLORIDE 20 MEQ/15ML (10%) PO LIQD
40.0000 meq | ORAL | Status: AC
  Administered 2011-01-21 (×2): 40 meq
  Filled 2011-01-21 (×3): qty 30

## 2011-01-21 MED ORDER — POTASSIUM CHLORIDE 10 MEQ/50ML IV SOLN
INTRAVENOUS | Status: AC
  Filled 2011-01-21: qty 50

## 2011-01-21 MED ORDER — CARVEDILOL 3.125 MG PO TABS
3.1250 mg | ORAL_TABLET | Freq: Two times a day (BID) | ORAL | Status: DC
  Administered 2011-01-21 – 2011-01-23 (×5): 3.125 mg via ORAL
  Filled 2011-01-21 (×7): qty 1

## 2011-01-21 NOTE — Progress Notes (Signed)
CRITICAL VALUE ALERT  Critical value received: Potassium 2.5 Date of notification:  01/21/11  Time of notification:  0510  Critical value read back:yes  Nurse who received alert:  Guadlupe Spanish    MD notified (1st page): Dr. Vassie Loll   Time of first page: 0515  MD notified (2nd page):   Time of second page:  Responding MD: Dr. Vassie Loll  Time MD responded:  [phone]

## 2011-01-21 NOTE — Progress Notes (Signed)
Name: [name] MRN: [id number] DOB: 08/10/1945    LOS: 20  PCCM PROGRESS NOTE  History of Present Illness: 65 y/o F with PMHx of HTN suffered fall on 12/24/10 and LE pain. Initially taken to [facility] and diagnosed with R femoral DVT and started on anticoagulation. Pt had persistent pain and she had further imaging studies which showed R femoral fracture. Pt was transferred to [facility] for further management and was taken to the OR on 01/03/11 and underwent Open reduction and internal fixation of the right femur fracture. Post op suffered episode of Torsades and underwent 7-10 mins CPR. Subsequently developed AFRVR and hypotension. EKG demonstrated ST elevation on the inferior leads. Underwent cardiac cath 12/7. PCCM asked to assist with vent/CCM issues.  Extubated 01/10/11 and transferred out of 2300 12/18.  However on 12/20 developed acute onset shortness of breath, likely due to aspiration, re-intubated.  Lines / Drains: 12/07 L IJ CVL>>> 12/14 12/07 ETT>>> 12/10, 12/11 >>> 12/14 , 12/21 >> 12/21 12/21 perc trach (DF/KZ) >> 12/14 PICC >>>  12/15 Foley>>>  Cultures: 12/10 UC>>>neg 12/10 RC>>>neg 12/21 Sputum>>>NTD. 12/21 blood>>>NTD.  Antibiotics: 12/10 Vancomycin >>> 12/11  12/10 Ceftazidime (fever, purulent sputum, cloudy urine) >>> 12/18 12/21 vanc (HCAP vs. Asp PNA) >> 12/21 Zosyn (HCAP vs. Asp PNA) >> 12/21 Cipro (HCAP vs. 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 Severe left ventricular dysfunction with inferior apical and distal anterior hypokinesis of severe degree. The estimated ejection fraction is 25%.  12/08 CT head: No acute abnormalities  12/08 Echo: Systolic function was moderately to severely reduced. The estimated  ejection fraction was in the range of 30% to 35%. There is akinesis of the apical myocardium. There is akinesis of the inferoposterior myocardium. There is hypokinesis of the lateral myocardium  12/11 Reintubated for sudden onset resp distress  12/11 CTA chest: No evidence of pulmonary embolus. Moderate bilateral effusions with significant consolidation and atelectasis in both lungs. Perihilar infiltration suggesting edema. Indeterminate mass or fluid collection in the right supraclavicular region measuring about 3 x 2.2 cm.  12/14 Extubated 12/20 Acute resp distress, transfer to 3300 12/21 Intubated for aspiration pneumonia  Overnight:   Failed swallow eval tolerated TC all night  Vital Signs: Temp:  [97.5 F (36.4 C)-100.6 F (38.1 C)] 99.5 F (37.5 C) (12/25 0400) Pulse Rate:  [71-96] 72  (12/25 0600) Resp:  [14-35] 26  (12/25 0600) BP: (84-129)/(48-95) 121/72 mmHg (12/25 0600) SpO2:  [98 %-100 %] 100 % (12/25 0600) FiO2 (%):  [28 %-100 %] 28 % (12/25 0600) Weight:  [47.6 kg (104 lb 15 oz)] 104 lb 15 oz (47.6 kg) (12/25 0600)   Intake/Output Summary (Last 24 hours) at 01/21/11 0648 Last data filed at 01/21/11 4098  Gross per 24 hour  Intake 1950.08 ml  Output   3220 ml  Net -1269.92 ml    Physical Examination: Gen: confortable on TC, moving around, trying to talk HEENT: NCAT, PERRL, Trach site with slow ooze of bleeding PULM: CTA B on L, bronchial breath sounds on R CV: Tachycardic, no mgr, no JVD AB: BS+, soft, nontender, no hsm Ext: warm, no edema, no clubbing, no cyanosis; R PICC site dressing c/d/i Derm: no rash or skin breakdown Neuro: More  appropriate, trying to communicate, moves all four ext spontaneously  BMET    Component Value Date/Time   NA 141 01/21/2011 0400   K 2.5* 01/21/2011 0400   CL 108 01/21/2011 0400   CO2 23 01/21/2011 0400   GLUCOSE 88 01/21/2011 0400   BUN 5* 01/21/2011 0400   CREATININE 0.73 01/21/2011 0400   CALCIUM 8.3* 01/21/2011 0400    GFRNONAA 86* 01/21/2011 0400   GFRAA >90 01/21/2011 0400   CBC    Component Value Date/Time   WBC 7.1 01/21/2011 0400   RBC 3.39* 01/21/2011 0400   HGB 9.0* 01/21/2011 0400   HCT 27.5* 01/21/2011 0400   PLT 132* 01/21/2011 0400   MCV 81.1 01/21/2011 0400   MCH 26.5 01/21/2011 0400   MCHC 32.7 01/21/2011 0400   RDW 19.5* 01/21/2011 0400   LYMPHSABS 1.9 01/13/2011 0615   MONOABS 0.9 01/13/2011 0615   EOSABS 0.0 01/13/2011 0615   BASOSABS 0.0 01/13/2011 0615   Ventilator settings: Vent Mode:  [-]  FiO2 (%):  [28 %-100 %] 28 %  Labs and Imaging:  Reviewed.  Please refer to the Assessment and Plan section for relevant results.  Assessment and Plan:  Ischemic cardiomyopathy, MI, status post torsades cardiac arrest, atrial fibrillation with RVR, hypertension.  Status post bare metal stent placement. Cardiology following.   - Continue ASA, Plavix (d/c after 30 days per Cardiology). - Continue Metoprolol, Lisinopril. - Continue Crestor. - Continue Amiodarone (for 6-8 weeks per Cardiology).  Respiratory Failure: aspiration pneumonia vs. HCAP (WBC up, febrile), exam not consistent with volume overload. - TC as tolerated. - Sent blood cultures and sputum culture all NTD. - Vanc/zosyn/cipro for now, probably narrow 12/26  Hypokalemia  Lab 01/21/11 0400 01/20/11 0442 01/19/11 0500 01/17/11 0600 01/15/11 1439  K 2.5* 2.4* 2.5* 3.1* 4.1  - Replaced this am  Right Lower Extremity DVT  - Heparin drip on hold due to drop in Hg, will recheck H/H post transfusion. - Hold Coumadin for now until trach bleeding resolves.  Anemia, bleeding from trach site on plavix, heparin, ASA  Lab 01/21/11 0400 01/20/11 1546 01/20/11 0500 01/20/11 0442 01/19/11 1450  HCT 27.5* 29.3* 16.5* 18.1* 22.6*  - Check CBC and type/screen this pm. - PRBC for Hgb < 7.0, will recheck H/H post transfusion.  Malnutrition, dysphagia.  Swallowing assessment completed. - Place panda, re-assess swallow when  tolerating PMV.  Encephalopathy / delirium, improved - Monitor  Best practices / Disposition - Full code - DVT Px therapeutic Heparin - GI Px protonix - Diet - TF via panda - transfer to SDU  Levy Pupa, MD, PhD 01/21/2011, 7:05 AM [facility] [phone] or if no answer [phone]

## 2011-01-21 NOTE — Progress Notes (Signed)
KUB reviewed by Dr. Vassie Loll. Okay to give meds down panda. Air bolus auscultated in stomach.

## 2011-01-21 NOTE — Progress Notes (Signed)
ANTICOAGULATION CONSULT NOTE - Follow Up Consult  Pharmacy Consult for Heparin/Coumadin Indication: DVT  Assessment: 65yoF with right lower extremity DVT with heparin/coumadin on hold due to trach bleeding and drop in Hgb yesterday. Was transfused yesterday. (Hgb up to 9.0 from 5.3).  Patient also on plavix. INR 2.79 this morning after holding dose.  Goal of Therapy:  Heparin Level 0.3-0.7 INR 2-3  Plan:  1) Continue to hold coumadin and heparin per MD order 2) F/U plans for restart (Please let pharmacy know when it is appropriate to resume heparin/coumadin).   Benjaman Pott, PharmD     Pager [phone] 01/21/2011   8:32 AM  ----  No Known Allergies  Patient Measurements: Height: 5' (152.4 cm) Weight: 104 lb 15 oz (47.6 kg) (weighed in bed) IBW/kg (Calculated) : 45.5   Vital Signs: Temp: 98.1 F (36.7 C) (12/25 0740) Temp src: Oral (12/25 0740) BP: 126/69 mmHg (12/25 0802) Pulse Rate: 81  (12/25 0802)  Labs:  Basename 01/21/11 0400 01/20/11 1546 01/20/11 0500 01/20/11 0442 01/19/11 0500 01/19/11 0117 01/19/11 0115 01/18/11 0945  HGB 9.0* 9.7* -- -- -- -- -- --  HCT 27.5* 29.3* 16.5* -- -- -- -- --  PLT 132* 146* 127* -- -- -- -- --  APTT -- -- -- -- -- -- -- --  LABPROT 29.9* -- -- -- -- -- 30.7* --  INR 2.79* -- -- -- -- -- 2.89* --  HEPARINUNFRC -- -- -- -- -- 0.36 -- 0.14*  CREATININE 0.73 -- -- 0.66 0.69 -- -- --  CKTOTAL -- -- -- -- -- -- -- --  CKMB -- -- -- -- -- -- -- --  TROPONINI -- -- -- -- -- -- -- --   Estimated Creatinine Clearance: 49 ml/min (by C-G formula based on Cr of 0.73).

## 2011-01-21 NOTE — Progress Notes (Signed)
eLink Physician-Brief Progress Note Patient Name: [name] DOB: 12/07/1945 MRN: [id number]  Date of Service  01/21/2011   HPI/Events of Note     eICU Interventions  Hypokalemia -repleted    Intervention Category Intermediate Interventions: Electrolyte abnormality - evaluation and management  [name] V. 01/21/2011, 5:16 AM

## 2011-01-21 NOTE — Progress Notes (Signed)
@   Subjective:  Patient awake and alert; s/p trach; no chest pain or SOB   Objective:  Filed Vitals:   01/21/11 0300 01/21/11 0335 01/21/11 0400 01/21/11 0500  BP: 111/73 122/68 121/66 127/68  Pulse: 74 79 80 74  Temp:   99.5 F (37.5 C)   TempSrc:   Oral   Resp: 31 30 28 23   Height:      Weight:      SpO2: 100% 100% 100% 100%    Intake/Output from previous day:  Intake/Output Summary (Last 24 hours) at 01/21/11 0531 Last data filed at 01/21/11 0400  Gross per 24 hour  Intake 1961.75 ml  Output   3285 ml  Net -1323.25 ml    Physical Exam: Physical exam: Skin is warm and dry.  HEENT is normal.  Neck s/p trach Chest diminished BS bases Cardiovascular exam RRR Abdominal exam nontender or distended. No masses palpated. Extremies s/p right leg surgery with no edema Neuro moves all extremities     Lab Results: Basic Metabolic Panel:  Basename 01/21/11 0400 01/20/11 0442  NA 141 139  K 2.5* 2.4*  CL 108 110  CO2 23 23  GLUCOSE 88 117*  BUN 5* 5*  CREATININE 0.73 0.66  CALCIUM 8.3* 7.9*  MG 1.7 --  PHOS 2.8 --   CBC:  Basename 01/21/11 0400 01/20/11 1546  WBC 7.1 9.5  NEUTROABS -- --  HGB 9.0* 9.7*  HCT 27.5* 29.3*  MCV 81.1 80.9  PLT 132* 146*      Assessment/Plan:  #1 S/P MI complicated by cardiac arrest. Patient is status post bare-metal stent to the right coronary artery. Continue Plavix. DC plavix after 30 days given need for coumadin with DVT. DC ASA given use of plavix and coumadin #2 ischemic cardiomyopathy-Resume low dose beta blocker and ACEI. #3 ventricular and atrial arrhythmias-continue amiodarone 200 mg daily; DC in 6-8 weeks if she maintains sinus. #4 recent DVT-Continue coumadin #5 VDRF- felt related to aspiration; management per CCM #6 anemia #7 pneumonia-Antibiotics per critical care medicine. #8 status post repair of leg fracture-management per orthopedic surgery. #9 hypokalemia - supplement    [name] 01/21/2011,  5:31 AM

## 2011-01-22 ENCOUNTER — Inpatient Hospital Stay (HOSPITAL_COMMUNITY): Payer: Medicare Other

## 2011-01-22 DIAGNOSIS — Z9911 Dependence on respirator [ventilator] status: Secondary | ICD-10-CM

## 2011-01-22 LAB — PROTIME-INR
INR: 2.32 — ABNORMAL HIGH (ref 0.00–1.49)
Prothrombin Time: 25.9 seconds — ABNORMAL HIGH (ref 11.6–15.2)

## 2011-01-22 LAB — CBC
MCHC: 32.9 g/dL (ref 30.0–36.0)
Platelets: 144 10*3/uL — ABNORMAL LOW (ref 150–400)
RDW: 19.3 % — ABNORMAL HIGH (ref 11.5–15.5)
WBC: 9 10*3/uL (ref 4.0–10.5)

## 2011-01-22 LAB — BASIC METABOLIC PANEL
CO2: 24 mEq/L (ref 19–32)
Calcium: 8.3 mg/dL — ABNORMAL LOW (ref 8.4–10.5)
Creatinine, Ser: 0.66 mg/dL (ref 0.50–1.10)
GFR calc Af Amer: >90 mL/min (ref >90)
GFR calc non Af Amer: 89 mL/min — ABNORMAL LOW (ref >90)
Sodium: 137 mEq/L (ref 135–145)

## 2011-01-22 MED ORDER — POTASSIUM CHLORIDE 20 MEQ/15ML (10%) PO LIQD
40.0000 meq | Freq: Three times a day (TID) | ORAL | Status: AC
  Administered 2011-01-22 (×2): 40 meq
  Filled 2011-01-22 (×4): qty 30

## 2011-01-22 MED ORDER — WARFARIN SODIUM 5 MG PO TABS
5.0000 mg | ORAL_TABLET | Freq: Once | ORAL | Status: AC
  Administered 2011-01-22: 5 mg via ORAL
  Filled 2011-01-22: qty 1

## 2011-01-22 MED ORDER — POTASSIUM CHLORIDE 20 MEQ/15ML (10%) PO LIQD
40.0000 meq | ORAL | Status: AC
  Administered 2011-01-22 (×2): 40 meq
  Filled 2011-01-22 (×2): qty 30

## 2011-01-22 MED ORDER — PANTOPRAZOLE SODIUM 40 MG PO PACK
40.0000 mg | PACK | Freq: Every day | ORAL | Status: DC
  Administered 2011-01-23 – 2011-01-28 (×6): 40 mg
  Filled 2011-01-22 (×6): qty 20

## 2011-01-22 MED ORDER — POTASSIUM CHLORIDE CRYS ER 20 MEQ PO TBCR: 40.0000 meq | EXTENDED_RELEASE_TABLET | Freq: Two times a day (BID) | ORAL | Status: DC

## 2011-01-22 MED ORDER — FUROSEMIDE 10 MG/ML IJ SOLN
40.0000 mg | Freq: Three times a day (TID) | INTRAMUSCULAR | Status: AC
  Administered 2011-01-22 (×2): 40 mg via INTRAVENOUS
  Filled 2011-01-22 (×2): qty 4

## 2011-01-22 NOTE — Progress Notes (Signed)
Patient ID: [name], female   DOB: [birth year], 65 y.o.   MRN: [id number] @ Subjective:  Patient awake and alert; s/p trach; no chest pain or SOB   Objective:  Filed Vitals:   01/22/11 0500 01/22/11 0600 01/22/11 0700 01/22/11 0736  BP: 117/67 141/81 138/87   Pulse: 83 83 87   Temp:    97.3 F (36.3 C)  TempSrc:    Oral  Resp: 24 23 29    Height:      Weight:  46.1 kg (101 lb 10.1 oz)    SpO2: 100% 100% 100%     Intake/Output from previous day:  Intake/Output Summary (Last 24 hours) at 01/22/11 0758 Last data filed at 01/22/11 0600  Gross per 24 hour  Intake   1470 ml  Output   1440 ml  Net     30 ml    Physical Exam: Physical exam: Skin is warm and dry.  HEENT is normal.  Neck s/p trach Chest diminished BS bases Cardiovascular exam RRR Abdominal exam nontender or distended. No masses palpated. Extremies s/p right leg surgery with no edema Neuro moves all extremities     Lab Results: Basic Metabolic Panel:  Basename 01/22/11 0400 01/21/11 1750 01/21/11 0400  NA 137 138 --  K 3.1* 3.6 --  CL 106 107 --  CO2 24 23 --  GLUCOSE 168* 137* --  BUN 5* 5* --  CREATININE 0.66 0.68 --  CALCIUM 8.3* 8.5 --  MG -- -- 1.7  PHOS -- -- 2.8   CBC:  Basename 01/22/11 0400 01/21/11 0400  WBC 9.0 7.1  NEUTROABS -- --  HGB 9.7* 9.0*  HCT 29.5* 27.5*  MCV 82.2 81.1  PLT 144* 132*      Assessment/Plan:  #1 S/P MI complicated by cardiac arrest. Patient is status post bare-metal stent to the right coronary artery. Continue Plavix. DC plavix after 30 days given need for coumadin with DVT. DC ASA given use of plavix and coumadin #2 ischemic cardiomyopathy-Resume low dose beta blocker and ACEI. #3 ventricular and atrial arrhythmias-continue amiodarone 200 mg daily; DC in 6-8 weeks if she maintains sinus. If she continues with stable recovery, would repeat echo in 3 months to eval LV function. #4 recent DVT-Continue coumadin #5 VDRF- felt related to aspiration;  management per CCM #6 anemia #7 pneumonia-Antibiotics per critical care medicine. #8 status post repair of leg fracture-management per orthopedic surgery. #9 hypokalemia - supplement    Lewayne Bunting 01/22/2011, 7:58 AM

## 2011-01-22 NOTE — Progress Notes (Signed)
CSW reviewed chart and note pt progressing to stepdown. Pt has been faxed out and CSW will provide bed offers to pt family. CSW will follow for d/c planning.  Baxter Flattery, MSW [phone]

## 2011-01-22 NOTE — Progress Notes (Signed)
Speech Pathology  PMSV (Passy-Muir Speaking Valve) Treatment Note   Tracheostomy Tube  Trach collar period: 24 hours Type: shiley Size:6.0     FiO2: 5  liters Cuff: yes Fenestration:  no Secretion description: thick, tan, bloody Frequency of tracheal suctioning:1 per hourly Level of secretion expectoration: tracheal with max cues to cough   PMSV Trial PMSV was placed for 1 minutes. Able to redirect subglottic air through upper airway: yes Able to attain phonation with PMSV: no (see below) Able to expectorate secretions with PMSV: yes (via oral cavity x1 with max verbal cues to initiate a cough) Breath Support for phonation: decreased Intelligibiity: <25% RR: 28-33 SpO2: 100% HR:mid 80s Behavior: alert   Clinical Impression:  PMSV placed for 1 minute with slightly improved ability to direct air through upper airway from initial evaluation 12/24. Patient able to attain faint breathy vocal quality x 1 out of 10-12 trials with max SLP verbal and visual cueing. Patient however, continues to present with increased WOB, RR, and evidence of CO2 trapping almost immediately after PMSV placed. Continue to suspect that thick secretions are playing a roll in inability to redirect air however size of trach/deflated cuff also likely to have an impact. SLP will continue to f/u. Pending improvement 12/27, may be beneficial to proceed with a FEES without PMSV to determine potential to resume a po diet as overall respiratory status appears to be improving despite inability to utilize upper airway.     Recommendations MD- Consider downsizing or changing to a cuffless trach when secretions better manamged.   1. Patient to use PMSV with full SLP supervision only   Treatment Plan   Short Term Goals 1. Patient will redirect subglottic air through upper airway in order to phonate for 3-5 seconds with maximum verbal/positional support/cues-progessing 2. Patient will expectorate secretions via trach with cuff  deflated with no tracheal suctioning-progressing  3. Patient will maintain VS without desaturation or Co2 trapping with PMSV in place for 60 seconds with verbal cues to fully exhale through upper airway-progressing  Pain: no  Ferdinand Lango MA, CCC-SLP [phone]

## 2011-01-22 NOTE — Progress Notes (Signed)
Name: [name] MRN: [id number] DOB: [birth year]    LOS: 21  PCCM PROGRESS NOTE  History of Present Illness: 65 y/o F with PMHx of HTN suffered fall on 12/24/10 and LE pain. Initially taken to [facility] and diagnosed with R femoral DVT and started on anticoagulation. Pt had persistent pain and she had further imaging studies which showed R femoral fracture. Pt was transferred to [facility] for further management and was taken to the OR on 01/03/11 and underwent Open reduction and internal fixation of the right femur fracture. Post op suffered episode of Torsades and underwent 7-10 mins CPR. Subsequently developed AFRVR and hypotension. EKG demonstrated ST elevation on the inferior leads. Underwent cardiac cath 12/7. PCCM asked to assist with vent/CCM issues.  Extubated 01/10/11 and transferred out of 2300 12/18.  However on 12/20 developed acute onset shortness of breath, likely due to aspiration, re-intubated.  Lines / Drains: 12/07 L IJ CVL>>> 12/14 12/07 ETT>>> 12/10, 12/11 >>> 12/14 , 12/21 >> 12/21 12/21 perc trach (DF/KZ) >> 12/14 PICC >>>  12/15 Foley>>>  Cultures: 12/10 UC>>>neg 12/10 RC>>>neg 12/21 Sputum>>>NTD. 12/21 blood>>>NTD.  Antibiotics: 12/10 Vancomycin >>> 12/11  12/10 Ceftazidime (fever, purulent sputum, cloudy urine) >>> 12/18 12/21 vanc (HCAP vs. Asp PNA) >> 12/21 Zosyn (HCAP vs. Asp PNA) >> 12/21 Cipro (HCAP vs. 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 Severe left ventricular dysfunction with inferior apical and distal anterior hypokinesis of severe degree. The estimated ejection fraction is 25%.  12/08 CT head: No acute abnormalities  12/08 Echo: Systolic function was moderately to severely reduced. The estimated  ejection fraction was in the range of 30% to 35%. There is akinesis of the apical myocardium. There is akinesis of the inferoposterior myocardium. There is hypokinesis of the lateral myocardium  12/11 Reintubated for sudden onset resp distress  12/11 CTA chest: No evidence of pulmonary embolus. Moderate bilateral effusions with significant consolidation and atelectasis in both lungs. Perihilar infiltration suggesting edema. Indeterminate mass or fluid collection in the right supraclavicular region measuring about 3 x 2.2 cm.  12/14 Extubated 12/20 Acute resp distress, transfer to 3300 12/21 Intubated for aspiration pneumonia  Overnight:   Failed swallow eval tolerated TC all night  Vital Signs: Temp:  [97.3 F (36.3 C)-99.2 F (37.3 C)] 97.3 F (36.3 C) (12/26 0736) Pulse Rate:  [72-87] 85  (12/26 0822) Resp:  [17-33] 28  (12/26 0822) BP: (100-157)/(57-87) 132/75 mmHg (12/26 0822) SpO2:  [94 %-100 %] 100 % (12/26 0822) FiO2 (%):  [28 %] 28 % (12/26 0822) Weight:  [46.1 kg (101 lb 10.1 oz)] 101 lb 10.1 oz (46.1 kg) (12/26 0600)   Intake/Output Summary (Last 24 hours) at 01/22/11 0831 Last data filed at 01/22/11 0600  Gross per 24 hour  Intake   1320 ml  Output   1420 ml  Net   -100 ml    Physical Examination: Gen: comfortable on TC, moving around, trying to talk HEENT: NCAT, PERRL, Trach site with slow ooze of bleeding PULM: CTA B on L, bronchial breath sounds on R CV: Tachycardic, no mgr, no JVD AB: BS+, soft, nontender, no hsm Ext: warm, no edema, no clubbing, no cyanosis; R PICC site dressing c/d/i Derm: no rash or skin  breakdown Neuro: More appropriate, trying to communicate, moves all four ext spontaneously  BMET    Component Value Date/Time   NA 137 01/22/2011 0400   K 3.1* 01/22/2011 0400   CL 106 01/22/2011 0400   CO2 24 01/22/2011 0400   GLUCOSE 168* 01/22/2011 0400   BUN 5* 01/22/2011 0400   CREATININE 0.66 01/22/2011 0400   CALCIUM 8.3* 01/22/2011 0400    GFRNONAA 89* 01/22/2011 0400   GFRAA >90 01/22/2011 0400   CBC    Component Value Date/Time   WBC 9.0 01/22/2011 0400   RBC 3.59* 01/22/2011 0400   HGB 9.7* 01/22/2011 0400   HCT 29.5* 01/22/2011 0400   PLT 144* 01/22/2011 0400   MCV 82.2 01/22/2011 0400   MCH 27.0 01/22/2011 0400   MCHC 32.9 01/22/2011 0400   RDW 19.3* 01/22/2011 0400   LYMPHSABS 1.9 01/13/2011 0615   MONOABS 0.9 01/13/2011 0615   EOSABS 0.0 01/13/2011 0615   BASOSABS 0.0 01/13/2011 0615   Ventilator settings: Vent Mode:  [-]  FiO2 (%):  [28 %] 28 %  Labs and Imaging:  Reviewed.  Please refer to the Assessment and Plan section for relevant results.  Assessment and Plan:  Ischemic cardiomyopathy, MI, status post torsades cardiac arrest, atrial fibrillation with RVR, hypertension.  Status post bare metal stent placement. Cardiology following.   - Continue ASA, Plavix (d/c after 30 days per Cardiology). - Continue Metoprolol, Lisinopril. - Continue Crestor. - Continue Amiodarone (for 6-8 weeks per Cardiology).  Respiratory Failure: aspiration pneumonia vs. HCAP (WBC up, febrile), exam not consistent with volume overload. - TC as tolerated well but failed swallow evaluation, ok for PMV but will need FEES in today or tomorrow depending on speech pathology's recommendations. - Sent blood cultures and sputum culture all NTD, will d/c cipro. - Vanc/zosyn for now.  Hypokalemia  Lab 01/22/11 0400 01/21/11 1750 01/21/11 0400 01/20/11 0442 01/19/11 0500  K 3.1* 3.6 2.5* 2.4* 2.5*  - Diurese, replace and recheck. - Two low doses of lasix today.  Right Lower Extremity DVT  - Restart heparin today and will start coumadin tomorrow as per orders to avoid hypercoagulable.  Anemia, bleeding from trach site on plavix, heparin, ASA  Lab 01/22/11 0400 01/21/11 0400 01/20/11 1546 01/20/11 0500 01/20/11 0442  HCT 29.5* 27.5* 29.3* 16.5* 18.1*  - Check CBC and type/screen this pm. - PRBC for Hgb < 7.0, will recheck H/H  post transfusion.  Malnutrition, dysphagia.  Swallowing assessment completed. - Placed panda and maintain until swallow evaluation complete, PEG only if speech do not feel patient will be able to swallow, will try to avoid.  Encephalopathy / delirium, improved - Monitor  Best practices / Disposition - Full code - DVT Px therapeutic Heparin - GI Px protonix - Diet - TF via panda - Transfer to SDU  Alyson Reedy, M.D. [facility] Pulmonary/Critical Care Medicine. Pager: [phone]. After hours pager: [phone].

## 2011-01-22 NOTE — Progress Notes (Signed)
ANTICOAGULATION CONSULT NOTE - Follow Up Consult  Pharmacy Consult for Heparin/Coumadin Indication: RLE DVT  Assessment: [name] with right lower extremity DVT to resume Coumadin today after anticoagulation stopped due to bleeding at trach site. Spoke with Dr. Molli Knock and will resume heparin if INR <2.  Of note, Cipro stopped today and LFTs were elevated 12/17 but have not been checked since. These two factors likely contributing to therapeutic INR with no Coumadin dosing since 12/21.  Goal of Therapy:  Heparin Level 0.3-0.7 INR 2-3  Plan:  1. Coumadin 5 mg po tonight 2. INR daily - will watch closely 3. Heparin if INR <2  Loura Back [facility] 01/22/2011 11:16 AM   ----  No Known Allergies  Patient Measurements: Height: 5' (152.4 cm) Weight: 101 lb 10.1 oz (46.1 kg) IBW/kg (Calculated) : 45.5   Vital Signs: Temp: 97.3 F (36.3 C) (12/26 0736) Temp src: Oral (12/26 0736) BP: 129/81 mmHg (12/26 1000) Pulse Rate: 79  (12/26 1000)  Labs:  Basename 01/22/11 0400 01/21/11 1750 01/21/11 0400 01/20/11 1546  HGB 9.7* -- 9.0* --  HCT 29.5* -- 27.5* 29.3*  PLT 144* -- 132* 146*  APTT -- -- -- --  LABPROT 25.9* -- 29.9* --  INR 2.32* -- 2.79* --  HEPARINUNFRC -- -- -- --  CREATININE 0.66 0.68 0.73 --  CKTOTAL -- -- -- --  CKMB -- -- -- --  TROPONINI -- -- -- --   Estimated Creatinine Clearance: 49 ml/min (by C-G formula based on Cr of 0.66).

## 2011-01-23 ENCOUNTER — Inpatient Hospital Stay (HOSPITAL_COMMUNITY): Payer: Medicare Other

## 2011-01-23 DIAGNOSIS — I2589 Other forms of chronic ischemic heart disease: Secondary | ICD-10-CM

## 2011-01-23 DIAGNOSIS — R579 Shock, unspecified: Secondary | ICD-10-CM

## 2011-01-23 DIAGNOSIS — I479 Paroxysmal tachycardia, unspecified: Secondary | ICD-10-CM

## 2011-01-23 DIAGNOSIS — Z9911 Dependence on respirator [ventilator] status: Secondary | ICD-10-CM

## 2011-01-23 DIAGNOSIS — J9600 Acute respiratory failure, unspecified whether with hypoxia or hypercapnia: Secondary | ICD-10-CM

## 2011-01-23 LAB — PROTIME-INR: Prothrombin Time: 22.7 seconds — ABNORMAL HIGH (ref 11.6–15.2)

## 2011-01-23 LAB — BASIC METABOLIC PANEL
BUN: 8 mg/dL (ref 6–23)
Creatinine, Ser: 0.8 mg/dL (ref 0.50–1.10)
GFR calc Af Amer: 86 mL/min — ABNORMAL LOW (ref >90)
GFR calc non Af Amer: 75 mL/min — ABNORMAL LOW (ref >90)

## 2011-01-23 LAB — CULTURE, BLOOD (ROUTINE X 2)
Culture  Setup Time: 201212210914
Culture: NO GROWTH
Culture: NO GROWTH

## 2011-01-23 LAB — CBC
MCH: 26.5 pg (ref 26.0–34.0)
MCHC: 31.9 g/dL (ref 30.0–36.0)
Platelets: 188 10*3/uL (ref 150–400)
RBC: 4.27 MIL/uL (ref 3.87–5.11)
RDW: 19 % — ABNORMAL HIGH (ref 11.5–15.5)

## 2011-01-23 MED ORDER — WARFARIN SODIUM 7.5 MG PO TABS
7.5000 mg | ORAL_TABLET | Freq: Once | ORAL | Status: AC
  Administered 2011-01-23: 7.5 mg via ORAL
  Filled 2011-01-23: qty 1

## 2011-01-23 MED ORDER — SODIUM CHLORIDE 0.9 % IJ SOLN
INTRAMUSCULAR | Status: AC
  Administered 2011-01-23: 13:00:00
  Filled 2011-01-23: qty 20

## 2011-01-23 MED ORDER — SODIUM CHLORIDE 0.9 % IJ SOLN
10.0000 mL | Freq: Two times a day (BID) | INTRAMUSCULAR | Status: DC
  Administered 2011-01-23 – 2011-02-06 (×19): 10 mL

## 2011-01-23 MED ORDER — ALTEPLASE 100 MG IV SOLR
2.0000 mg | Freq: Once | INTRAVENOUS | Status: AC
  Administered 2011-01-23: 2 mg
  Filled 2011-01-23: qty 2

## 2011-01-23 MED ORDER — FUROSEMIDE 10 MG/ML IJ SOLN
40.0000 mg | Freq: Three times a day (TID) | INTRAMUSCULAR | Status: AC
  Administered 2011-01-23 (×2): 40 mg via INTRAVENOUS
  Filled 2011-01-23 (×3): qty 4

## 2011-01-23 MED ORDER — POTASSIUM CHLORIDE 20 MEQ/15ML (10%) PO LIQD
40.0000 meq | Freq: Three times a day (TID) | ORAL | Status: AC
  Administered 2011-01-23 (×2): 40 meq
  Filled 2011-01-23 (×3): qty 30

## 2011-01-23 MED ORDER — SODIUM CHLORIDE 0.9 % IJ SOLN
10.0000 mL | INTRAMUSCULAR | Status: DC | PRN
  Administered 2011-01-23: 10 mL

## 2011-01-23 MED ORDER — POTASSIUM CHLORIDE 20 MEQ/15ML (10%) PO LIQD
40.0000 meq | Freq: Once | ORAL | Status: DC
  Filled 2011-01-23: qty 30

## 2011-01-23 MED ORDER — HEPARIN SOD (PORCINE) IN D5W 100 UNIT/ML IV SOLN
1000.0000 [IU]/h | INTRAVENOUS | Status: DC
  Administered 2011-01-23: 1000 [IU]/h via INTRAVENOUS
  Filled 2011-01-23 (×3): qty 250

## 2011-01-23 MED ORDER — SODIUM CHLORIDE 0.9 % IJ SOLN
INTRAMUSCULAR | Status: AC
  Administered 2011-01-23: 06:00:00
  Filled 2011-01-23: qty 10

## 2011-01-23 MED ORDER — CARVEDILOL 6.25 MG PO TABS
6.2500 mg | ORAL_TABLET | Freq: Two times a day (BID) | ORAL | Status: DC
  Administered 2011-01-23 – 2011-01-28 (×8): 6.25 mg via ORAL
  Filled 2011-01-23 (×12): qty 1

## 2011-01-23 MED ORDER — SODIUM CHLORIDE 0.9 % IJ SOLN
INTRAMUSCULAR | Status: AC
  Administered 2011-01-23: 3 mL
  Filled 2011-01-23: qty 20

## 2011-01-23 NOTE — Progress Notes (Signed)
MEDICATION RELATED CONSULT NOTE - FOLLOW UP   Pharmacy Consult:  Heparin Indication:  RLE DVT   HL = 0.61 (goal 0.3 - 0.7 units/mL) Heparin weight = 43kg  Assessment: Heparin level therapeutic on 1000 units/hr.  No bleeding documented.  Plan: - Continue heparin gtt without change - F/U AM labs   Phillips Climes, PharmD 01/23/2011

## 2011-01-23 NOTE — Progress Notes (Signed)
Noted OT order discontinued when pt. Was intubated. P.T. Now re-ordered, please re-order O.T. When pt. Appropriate.  Thanks-  Cassandria Anger, OTR/L Pager: [phone] 01/23/2011 .

## 2011-01-23 NOTE — Procedures (Signed)
Fiberoptic Endoscopic Evaluation of Swallow (FEES) Speech/language pathology Patient Details  Name: [name] MRN: [id number] Date of Birth: 1946-01-28  Today's Date: 01/23/2011    Past Medical History:  Past Medical History  Diagnosis Date  . Hypertension   . 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 Vitamin B12 deficiency (dietary) anemia 12/27/2010  . Arthritis   . MI (myocardial infarction) 01/09/2011  . Aspiration pneumonia 01/17/2011   Past Surgical History:  Past Surgical History  Procedure Date  . Abdominal hysterectomy     06/2010  . Femur im nail 01/03/2011    Procedure: INTRAMEDULLARY (IM) RETROGRADE FEMORAL NAILING;  Surgeon: Budd Palmer;  Location: [facility] OR;  Service: Orthopedics;  Laterality: Right;  Right IM Retrograde Femoral Nailing   HPI:  65 y.o. female admitted post fall on 11/27, tranferred to [facility] on 12/7 to receive further management of right femoral fracture. Pt intubated twice secondary to respiratory distress and dx with HCPNA.  Initial bedside swallow eval completed 12/17  with recs to begin conservative diet secondary to dysphagia (dysphagia 3, nectar-thick liquids).  Pt with respiratory distress 12/20; intubated, then trached 01/17/11.  Passy-Muir Speaking Valve eval 12/24 with poor tolerance/C02  retention.  Recs for instrumental swallow study prior to initiating POs given hx.     Recommendation/Prognosis   Dysphagia Diagnosis: Severe pharyngeal phase dysphagia  Clinical impression: Pt participated in limited FEES study.   Provided with ice chips and nectar-thick liquids (half teaspoon).  Cuff was deflated prior to study; Passy-Muir valve was not placed secondary to poor toleration.  Pt with edema noted in larynx and copious amounts of standing secretions (frothy/clear).  Swallow response was weak with reduced mobility of larynx and pharyngeal musculature. There was immediate vocal fold penetration and then aspiration of ice chips/nectars,  mixing with secretions and remaining diffusely throughout laryngeal vestibule and within glottis.  Pt unable to clear despite cued efforts to cough and repeated dry swallows.  Study discontinued;  green-tinged aspirate observed in trach collar after coughing.   Discussed results with pt, specifically that she is not ready to begin eating/drinking but that we will continue our efforts to work toward that end.       Recommendations 1. Continue NPO with Panda feeds; still considered to have potential for resumption of PO diet; would recommend deferring PEG decision for now. 2. Continue PMSV trials with SLP and dysphagia therapy   General:  Date of Onset: 01/05/11 Type of Study: Initial FEES Diet Prior to this Study: NPO;Panda Temperature Spikes Noted: No Respiratory Status: Trach Trach Size and Type: Cuff;#6;Deflated;With PMSV not in place;Other (Comment) (cuff deflated and RN suctioned pt prior to FEES.  ) History of Intubation: Yes (12/8-12/10; 12/12-12/15; 12/21, then trached) Length of Intubations (days): 5 days (Intubated 12/8-12/10, then reintubated on 12/12) Date extubated: 01/17/11 (trached) Behavior/Cognition: Alert;Cooperative Oral Cavity - Dentition: Missing dentition Vision: Functional for self-feeding Patient Positioning: Upright in chair Baseline Vocal Quality: Aphonic (passy muir valve NOT PLACED for FEES) Volitional Cough: Strong Volitional Swallow: Able to elicit Pharyngeal Secretions: Standing secretions in (comment) (thin, frothy standing secretions throughout pharynx) Ice chips: Tested (comment)   Pharyngeal Phase  Pharyngeal Phase Pharyngeal Phase: Impaired Pharyngeal - Nectar Pharyngeal - Nectar Teaspoon: Delayed swallow initiation;Premature spillage to pyriform;Reduced laryngeal elevation;Penetration/Aspiration before swallow;Penetration/Aspiration during swallow;Penetration/Aspiration after swallow;Lateral channel residue;Pharyngeal residue -  pyriform;Inter-arytenoid space residue;Pharyngeal residue - posterior pharnyx;Pharyngeal residue - cp segment;Moderate aspiration Penetration/Aspiration details (nectar teaspoon): Material enters airway, passes BELOW cords without attempt by patient to eject  out (silent aspiration) Pharyngeal - Thin Pharyngeal - Thin Teaspoon: Reduced laryngeal elevation;Premature spillage to pyriform;Penetration/Aspiration before swallow;Penetration/Aspiration during swallow;Penetration/Aspiration after swallow;Pharyngeal residue - pyriform;Pharyngeal residue - posterior pharnyx;Pharyngeal residue - cp segment;Inter-arytenoid space residue;Lateral channel residue;Moderate aspiration Penetration/Aspiration details (thin teaspoon): Material enters airway, passes BELOW cords without attempt by patient to eject out (silent aspiration)    [name], [place] CCC/SLP Pager [phone]    Blenda Mounts [name] 01/23/2011, 12:46 PM

## 2011-01-23 NOTE — Progress Notes (Signed)
Physical Therapy Evaluation Patient Details Name: [name] MRN: [id number] DOB: 1945-04-19 Today's Date: 01/23/2011  Problem List:  Patient Active Problem List  Diagnoses  . HTN (hypertension)  . Femoral DVT (deep venous thrombosis)  . Anemia  . Hyponatremia  . Vitamin B12 deficiency (dietary) anemia  . Lupus anticoagulant positive  . Femur fracture, right  . Osteoporosis  . Cardiogenic shock  . MI (myocardial infarction)  . Respiratory failure  . Aspiration pneumonia  . Ischemic cardiomyopathy    Past Medical History:  Past Medical History  Diagnosis Date  . Hypertension   . 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 Vitamin B12 deficiency (dietary) anemia 12/27/2010  . Arthritis   . MI (myocardial infarction) 01/09/2011  . Aspiration pneumonia 01/17/2011   Past Surgical History:  Past Surgical History  Procedure Date  . Abdominal hysterectomy     06/2010  . Femur im nail 01/03/2011    Procedure: INTRAMEDULLARY (IM) RETROGRADE FEMORAL NAILING;  Surgeon: Budd Palmer;  Location: [facility] OR;  Service: Orthopedics;  Laterality: Right;  Right IM Retrograde Femoral Nailing    PT Assessment/Plan/Recommendation PT Assessment Clinical Impression Statement: Pt is a 65 y/o female admitted s/p right femur ORIF with multiple intubations/extubations presenting with the below PT problem list.  Pt would benefit from acute PT to maximize independence and facilitate d/c to SNF. PT Recommendation/Assessment: Patient will need skilled PT in the acute care venue PT Problem List: Decreased strength;Decreased activity tolerance;Decreased balance;Decreased mobility;Decreased cognition;Decreased knowledge of use of DME;Decreased knowledge of precautions Barriers to Discharge: Decreased caregiver support PT Therapy Diagnosis : Generalized weakness;Difficulty walking PT Plan PT Frequency: Min 2X/week PT Treatment/Interventions: DME instruction;Gait training;Functional mobility  training;Therapeutic activities;Therapeutic exercise;Balance training;Cognitive remediation;Patient/family education PT Recommendation Follow Up Recommendations: Skilled nursing facility; OT consult. Equipment Recommended: Defer to next venue PT Goals  Acute Rehab PT Goals PT Goal Formulation: With patient Time For Goal Achievement: 2 weeks Pt will go Supine/Side to Sit: with mod assist PT Goal: Supine/Side to Sit - Progress: Not met Pt will go Sit to Supine/Side: with mod assist PT Goal: Sit to Supine/Side - Progress: Not met Pt will go Sit to Stand: with mod assist PT Goal: Sit to Stand - Progress: Not met Pt will go Stand to Sit: with mod assist PT Goal: Stand to Sit - Progress: Not met Pt will Transfer Bed to Chair/Chair to Bed: with mod assist PT Transfer Goal: Bed to Chair/Chair to Bed - Progress: Not met Pt will Ambulate: 1 - 15 feet;with mod assist;with least restrictive assistive device PT Goal: Ambulate - Progress: Not met Pt will Perform Home Exercise Program: with min assist PT Goal: Perform Home Exercise Program - Progress: Not met  PT Evaluation Precautions/Restrictions  Precautions Precautions: Fall Required Braces or Orthoses: No Restrictions Weight Bearing Restrictions: Yes RLE Weight Bearing: Touchdown weight bearing Other Position/Activity Restrictions: Right LE ROM without limitations. Prior Functioning  Home Living Lives With: Alone Receives Help From: Family Type of Home: House Home Layout: Two level;Able to live on main level with bedroom/bathroom Alternate Level Stairs-Rails: Right Alternate Level Stairs-Number of Steps: full flight but doesn't have to go up stairs Home Access: Stairs to enter Entrance Stairs-Rails: Right Entrance Stairs-Number of Steps: 2 Home Adaptive Equipment: None Additional Comments: Will need admit to ST-SNF at d/c. Prior Function Level of Independence: Independent with basic ADLs;Independent with homemaking with  ambulation;Independent with gait;Independent with transfers Able to Take Stairs?: Yes Driving: Yes Vocation: Part time employment Cognition Cognition Arousal/Alertness: Awake/alert  Overall Cognitive Status: Impaired Attention: Appears overall intact for tasks assessed Current Attention Level: Sustained Attention - Other Comments: Sustained throughout treatment. Orientation Level: Oriented to person;Oriented to place;Disoriented to time;Disoriented to situation Following Commands: Follows one step commands inconsistently Cognition - Other Comments: Slow to process information. Sensation/Coordination Sensation Light Touch: Appears Intact Stereognosis: Not tested Hot/Cold: Not tested Proprioception: Not tested Coordination Gross Motor Movements are Fluid and Coordinated: Yes Fine Motor Movements are Fluid and Coordinated: Not tested Extremity Assessment RUE Assessment RUE Assessment: Not tested LUE Assessment LUE Assessment: Not tested RLE Assessment RLE Assessment: Exceptions to [facility] RLE PROM (degrees) Right Knee Flexion 0-140: 120  RLE Strength RLE Overall Strength: Deficits RLE Overall Strength Comments: grossly 2/5 LLE Assessment LLE Assessment: Exceptions to [facility] LLE Strength LLE Overall Strength: Deficits LLE Overall Strength Comments: grossly 2/5 Pain Nothing apparent with treatment. Mobility (including Balance) Bed Mobility Bed Mobility: Yes Supine to Sit: 2: Max assist;HOB flat Supine to Sit Details (indicate cue type and reason): Assist for bilateral LEs and trunk to translate anterior over BOS and bring off EOB.  Cues for sequence throughout. Sitting - Scoot to Edge of Bed: 2: Max assist Sitting - Scoot to [place] of Bed Details (indicate cue type and reason): Assist to reciprocally scoot hips to EOB with facilitation to shift weight left and right.  Cues for sequence. Sit to Supine - Right: Not Tested (comment) Transfers Transfers: Yes Sit to Stand: 2: Max  assist;With upper extremity assist;From bed (4 trials.) Sit to Stand Details (indicate cue type and reason): Assist to block bilateral knees to prevent sliding and buckling with facilitation at sacrum to translate anterior.  Cues for sequence.  Assist to also keep right LE TDWBing with stance. Stand to Sit: 2: Max assist;With upper extremity assist;To bed;To chair/3-in-1 (4 trials.) Stand to Sit Details: Assist to slow descent to chair with cues for safest hand placement while blocking feet from sliding. Stand Pivot Transfers: 2: Max Actuary Details (indicate cue type and reason): Assist to translate hips anterior over BOS and keep right LE TDWBing with transfer while rotating hips.  Cues for sequence. Lateral/Scoot Transfers: Not tested (comment) Ambulation/Gait Ambulation/Gait: No Stairs: No Wheelchair Mobility Wheelchair Mobility: No  Posture/Postural Control Posture/Postural Control: Postural limitations Postural Limitations: Trunk/core weakness in sitting causing thrunk to flex and pelvis to posteriorly tilt. Balance Balance Assessed: Yes Static Sitting Balance Static Sitting - Balance Support: Bilateral upper extremity supported;Feet supported Static Sitting - Level of Assistance: 3: Mod assist (Due to trunk weakness with posterior lean.) Static Sitting - Comment/# of Minutes: 5 Exercise  Total Joint Exercises Ankle Circles/Pumps: AAROM;Right;10 reps;Supine Quad Sets: AAROM;Right;10 reps;Supine Heel Slides: AAROM;Right;10 reps;Supine Hip ABduction/ADduction: AAROM;Right;10 reps;Supine Straight Leg Raises: AAROM;Right;10 reps;Supine End of Session PT - End of Session Equipment Utilized During Treatment: Gait belt Activity Tolerance: Patient tolerated treatment well Patient left: in chair;with call bell in reach Psychiatrist present.) Nurse Communication: Mobility status for transfers General Behavior During Session: [facility] for tasks performed Cognition:  Impaired Cognitive Impairment: Pt. unable to follow 1-step commands consistently and slow to process information.  Jaeger Trueheart M 01/23/2011, 12:45 PM  01/23/2011 Cephus Shelling, PT, DPT [phone]

## 2011-01-23 NOTE — Progress Notes (Signed)
Name: [name] MRN: [id number] DOB: [birth year]    LOS: 22  PCCM PROGRESS NOTE  History of Present Illness: 65 y/o F with PMHx of HTN suffered fall on 12/24/10 and LE pain. Initially taken to [facility] and diagnosed with R femoral DVT and started on anticoagulation. Pt had persistent pain and she had further imaging studies which showed R femoral fracture. Pt was transferred to [facility] for further management and was taken to the OR on 01/03/11 and underwent Open reduction and internal fixation of the right femur fracture. Post op suffered episode of Torsades and underwent 7-10 mins CPR. Subsequently developed AFRVR and hypotension. EKG demonstrated ST elevation on the inferior leads. Underwent cardiac cath 12/7. PCCM asked to assist with vent/CCM issues.  Extubated 01/10/11 and transferred out of 2300 12/18.  However on 12/20 developed acute onset shortness of breath, likely due to aspiration, re-intubated.  Lines / Drains: 12/07 L IJ CVL>>> 12/14 12/07 ETT>>> 12/10, 12/11 >>> 12/14 , 12/21 >> 12/21 12/21 perc trach (DF/KZ) >> 12/14 PICC >>>  12/15 Foley>>>  Cultures: 12/10 UC>>>neg 12/10 RC>>>neg 12/21 Sputum>>>NTD. 12/21 blood>>>NTD.  Antibiotics: 12/10 Vancomycin >>> 12/11  12/10 Ceftazidime (fever, purulent sputum, cloudy urine) >>> 12/18 12/21 vanc (HCAP vs. Asp PNA) >> 12/21 Zosyn (HCAP vs. Asp PNA) >> 12/21 Cipro (HCAP vs. 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 Severe left ventricular dysfunction with inferior apical and distal anterior hypokinesis of severe degree. The estimated ejection fraction is 25%.  12/08 CT head: No acute abnormalities  12/08 Echo: Systolic function was moderately to severely reduced. The estimated  ejection fraction was in the range of 30% to 35%. There is akinesis of the apical myocardium. There is akinesis of the inferoposterior myocardium. There is hypokinesis of the lateral myocardium  12/11 Reintubated for sudden onset resp distress  12/11 CTA chest: No evidence of pulmonary embolus. Moderate bilateral effusions with significant consolidation and atelectasis in both lungs. Perihilar infiltration suggesting edema. Indeterminate mass or fluid collection in the right supraclavicular region measuring about 3 x 2.2 cm.  12/14 Extubated 12/20 Acute resp distress, transfer to 3300 12/21 Intubated for aspiration pneumonia  Overnight:   Failed swallow eval tolerated TC all night  Vital Signs: Temp:  [98.4 F (36.9 C)-98.9 F (37.2 C)] 98.5 F (36.9 C) (12/27 0427) Pulse Rate:  [79-106] 95  (12/27 0801) Resp:  [20-32] 32  (12/27 0801) BP: (106-143)/(66-98) 124/73 mmHg (12/27 0801) SpO2:  [98 %-100 %] 100 % (12/27 0933) FiO2 (%):  [28 %] 28 % (12/27 0933) Weight:  [43.2 kg (95 lb 3.8 oz)-46.1 kg (101 lb 10.1 oz)] 95 lb 3.8 oz (43.2 kg) (12/27 0427)   Intake/Output Summary (Last 24 hours) at 01/23/11 0956 Last data filed at 01/23/11 0900  Gross per 24 hour  Intake   1680 ml  Output   4200 ml  Net  -2520 ml    Physical Examination: Gen: comfortable on TC, moving around, trying to talk HEENT: NCAT, PERRL, Trach site with slow ooze of bleeding PULM: CTA B on L, bronchial breath sounds on R CV: Tachycardic, no mgr, no JVD AB: BS+, soft, nontender, no hsm Ext: warm, no edema, no clubbing, no cyanosis; R PICC site dressing c/d/i Derm:  no rash or skin breakdown Neuro: More appropriate, trying to communicate, moves all four ext spontaneously  BMET    Component Value Date/Time   NA 140 01/23/2011 0409   K 3.3* 01/23/2011 0409   CL 103 01/23/2011 0409   CO2 28 01/23/2011 0409   GLUCOSE 142* 01/23/2011 0409   BUN 8 01/23/2011 0409   CREATININE 0.80 01/23/2011 0409   CALCIUM  9.0 01/23/2011 0409   GFRNONAA 75* 01/23/2011 0409   GFRAA 86* 01/23/2011 0409   CBC    Component Value Date/Time   WBC 9.0 01/23/2011 0409   RBC 4.27 01/23/2011 0409   HGB 11.3* 01/23/2011 0409   HCT 35.4* 01/23/2011 0409   PLT 188 01/23/2011 0409   MCV 82.9 01/23/2011 0409   MCH 26.5 01/23/2011 0409   MCHC 31.9 01/23/2011 0409   RDW 19.0* 01/23/2011 0409   LYMPHSABS 1.9 01/13/2011 0615   MONOABS 0.9 01/13/2011 0615   EOSABS 0.0 01/13/2011 0615   BASOSABS 0.0 01/13/2011 0615   Ventilator settings: Vent Mode:  [-]  FiO2 (%):  [28 %] 28 %  Labs and Imaging:  Reviewed.  Please refer to the Assessment and Plan section for relevant results.  Assessment and Plan:  Ischemic cardiomyopathy, MI, status post torsades cardiac arrest, atrial fibrillation with RVR, hypertension.  Status post bare metal stent placement. Cardiology following.   - Continue ASA, Plavix (d/c after 30 days per Cardiology). - Continue Metoprolol, Lisinopril. - Continue Crestor. - Continue Amiodarone (for 6-8 weeks per Cardiology).  Respiratory Failure: aspiration pneumonia vs. HCAP (WBC up, febrile), exam not consistent with volume overload. - TC as tolerated well but failed swallow evaluation, ok for PMV but will need FEES in today and diet as per speech recommendations. - Sent blood cultures and sputum culture all NTD, will d/c vanc. - Zosyn for now. - If no changes in fever/wbc by AM will change to PO Augmentin and if remains stable will be ok to finish just course of Augmentin.  Hypokalemia  Lab 01/23/11 0409 01/22/11 0400 01/21/11 1750 01/21/11 0400 01/20/11 0442  K 3.3* 3.1* 3.6 2.5* 2.4*  - Diurese, replace and recheck. - Two low doses of lasix again today.  Right Lower Extremity DVT  - Coumadin per pharmacy.  Anemia, bleeding from trach site on plavix, heparin, ASA  Lab 01/23/11 0409 01/22/11 0400 01/21/11 0400 01/20/11 1546 01/20/11 0500  HCT 35.4* 29.5* 27.5* 29.3* 16.5*  - Check CBC and  type/screen this pm. - PRBC for Hgb < 7.0, will recheck H/H post transfusion.  Malnutrition, dysphagia.  Swallowing assessment completed. - Placed panda and maintain until swallow evaluation complete, PEG only if speech do not feel patient will be able to swallow, will try to avoid.  Encephalopathy / delirium, improved - Monitor  Social work consult for placement.  Alyson Reedy, M.D. [facility] Pulmonary/Critical Care Medicine. Pager: [phone]. After hours pager: [phone].

## 2011-01-23 NOTE — Progress Notes (Signed)
MEDICATION RELATED CONSULT NOTE - FOLLOW UP   Pharmacy Consult for Heparin/Coumadin and Vancomycin/Zosyn Indication: RLE DVT and HCAP vs aspiration PNA  No Known Allergies  Patient Measurements: Height: 5' (152.4 cm) Weight: 95 lb 3.8 oz (43.2 kg) IBW/kg (Calculated) : 45.5  Adjusted Body Weight:   Vital Signs: Temp: 98.5 F (36.9 C) (12/27 0427) Temp src: Oral (12/27 0427) BP: 106/85 mmHg (12/27 0427) Pulse Rate: 103  (12/27 0427) Intake/Output from previous day: 12/26 0701 - 12/27 0700 In: 1795 [I.V.:460; NG/GT:935; IV Piggyback:400] Out: 4480 [Urine:4480] Intake/Output from this shift:    Labs:  Basename 01/23/11 0409 01/22/11 0400 01/21/11 1750 01/21/11 0400  WBC 9.0 9.0 -- 7.1  HGB 11.3* 9.7* -- 9.0*  HCT 35.4* 29.5* -- 27.5*  PLT 188 144* -- 132*  APTT -- -- -- --  CREATININE 0.80 0.66 0.68 --  LABCREA -- -- -- --  CREATININE 0.80 0.66 0.68 --  CREAT24HRUR -- -- -- --  MG 2.0 -- -- 1.7  PHOS 3.4 -- -- 2.8  ALBUMIN -- -- -- --  PROT -- -- -- --  ALBUMIN -- -- -- --  AST -- -- -- --  ALT -- -- -- --  ALKPHOS -- -- -- --  BILITOT -- -- -- --  BILIDIR -- -- -- --  IBILI -- -- -- --   12/26: PT/INR 25.9/2.32 12/27:              22.7/1.96  Estimated Creatinine Clearance: 46.5 ml/min (by C-G formula based on Cr of 0.8).   Microbiology: Recent Results (from the past 720 hour(s))  URINE CULTURE     Status: Normal   Collection Time   12/27/10  3:43 PM      Component Value Range Status Comment   Specimen Description URINE, CATHETERIZED   Final    Special Requests NONE   Final    Setup Time [id number]   Final    Colony Count NO GROWTH   Final    Culture NO GROWTH   Final    Report Status 12/29/2010 FINAL   Final   CULTURE, BLOOD (ROUTINE X 2)     Status: Normal   Collection Time   12/28/10 12:27 AM      Component Value Range Status Comment   Specimen Description BLOOD RIGHT HAND   Final    Special Requests BOTTLES DRAWN AEROBIC AND ANAEROBIC 6CC    Final    Culture NO GROWTH 5 DAYS   Final    Report Status 01/02/2011 FINAL   Final   CULTURE, BLOOD (ROUTINE X 2)     Status: Normal   Collection Time   12/28/10 12:31 AM      Component Value Range Status Comment   Specimen Description BLOOD LEFT ARM   Final    Special Requests BOTTLES DRAWN AEROBIC AND ANAEROBIC 6CC   Final    Culture NO GROWTH 5 DAYS   Final    Report Status 01/02/2011 FINAL   Final   MRSA PCR SCREENING     Status: Normal   Collection Time   01/02/11  7:46 PM      Component Value Range Status Comment   MRSA by PCR NEGATIVE  NEGATIVE  Final   CULTURE, RESPIRATORY     Status: Normal   Collection Time   01/06/11 12:05 PM      Component Value Range Status Comment   Specimen Description ENDOTRACHEAL ASPIRATE   Final    Special Requests  NONE   Final    Gram Stain     Final    Value: FEW WBC PRESENT,BOTH PMN AND MONONUCLEAR     NO SQUAMOUS EPITHELIAL CELLS SEEN     NO ORGANISMS SEEN   Culture Non-Pathogenic Oropharyngeal-type Flora Isolated.   Final    Report Status 01/09/2011 FINAL   Final   MRSA PCR SCREENING     Status: Normal   Collection Time   01/16/11 10:03 PM      Component Value Range Status Comment   MRSA by PCR NEGATIVE  NEGATIVE  Final   CULTURE, RESPIRATORY     Status: Normal   Collection Time   01/17/11  1:01 AM      Component Value Range Status Comment   Specimen Description TRACHEAL ASPIRATE   Final    Special Requests NONE   Final    Gram Stain     Final    Value: ABUNDANT WBC PRESENT, PREDOMINANTLY PMN     FEW SQUAMOUS EPITHELIAL CELLS PRESENT     RARE GRAM POSITIVE RODS     RARE GRAM POSITIVE COCCI     IN PAIRS RARE GRAM NEGATIVE RODS   Culture Non-Pathogenic Oropharyngeal-type Flora Isolated.   Final    Report Status 01/19/2011 FINAL   Final   CULTURE, BLOOD (ROUTINE X 2)     Status: Normal (Preliminary result)   Collection Time   01/17/11  2:17 AM      Component Value Range Status Comment   Specimen Description BLOOD LEFT ARM   Final     Special Requests BOTTLES DRAWN AEROBIC ONLY 10CC   Final    Setup Time [id number]   Final    Culture     Final    Value:        BLOOD CULTURE RECEIVED NO GROWTH TO DATE CULTURE WILL BE HELD FOR 5 DAYS BEFORE ISSUING A FINAL NEGATIVE REPORT   Report Status PENDING   Incomplete   CULTURE, BLOOD (ROUTINE X 2)     Status: Normal (Preliminary result)   Collection Time   01/17/11  2:22 AM      Component Value Range Status Comment   Specimen Description BLOOD LEFT HAND   Final    Special Requests BOTTLES DRAWN AEROBIC ONLY 4CC   Final    Setup Time [id number]   Final    Culture     Final    Value:        BLOOD CULTURE RECEIVED NO GROWTH TO DATE CULTURE WILL BE HELD FOR 5 DAYS BEFORE ISSUING A FINAL NEGATIVE REPORT   Report Status PENDING   Incomplete     Medications:  Scheduled:     . albuterol  2.5 mg Nebulization Once  . amiodarone  200 mg Oral Daily  . antiseptic oral rinse  15 mL Mouth Rinse 6 X Daily  . carvedilol  3.125 mg Oral BID WC  . chlorhexidine  15 mL Mouth Rinse BID  . clopidogrel  75 mg Oral Q breakfast  . cyanocobalamin  1,000 mcg Intramuscular Weekly  . furosemide  40 mg Intravenous Q8H  . levalbuterol  0.63 mg Nebulization TID  . lisinopril  2.5 mg Oral Daily  . pantoprazole sodium  40 mg Per Tube Daily  . piperacillin-tazobactam (ZOSYN)  IV  3.375 g Intravenous Q8H  . potassium chloride  40 mEq Per Tube Q4H  . potassium chloride  40 mEq Per Tube TID  . potassium chloride  40 mEq Per  Tube Once  . rosuvastatin  10 mg Oral Daily  . sodium chloride      . vancomycin  1,000 mg Intravenous Q24H  . warfarin  5 mg Oral ONCE-1800  . DISCONTD: ciprofloxacin  400 mg Intravenous Q12H  . DISCONTD: pantoprazole (PROTONIX) IV  40 mg Intravenous Daily  . DISCONTD: potassium chloride  40 mEq Oral BID   Assessment: [name] on day 7 Vancomycin and Zosyn for HCAP vs aspiration PNA. Renal fxn is stable and all culture data is negative thus far.  Coumadin resumed yesterday  for RLE DVT. INR was at goal range yesterday but today INR is SUBtherapeutic and trending down likely due to being held for several days because of bleeding at trach site.  Goal of Therapy:  Vancomycin trough 15-20 Heparin level 0.3-0.7 INR 2-3  Plan:  1. Vancomycin trough at 23:30 tonight.  2. Continue Zosyn 3.375 g IV q8h (infused over 4 hrs). 3. Begin heparin at 1000 units/hr with no bolus for INR <2. 4. Check heparin level 6 hrs after heparin started. 5. Daily heparin level, CBC, and INR 6. Coumadin 7.5 mg po tonight  Baden, Trevin Gartrell Danielle 01/23/2011,7:45 AM

## 2011-01-23 NOTE — Progress Notes (Signed)
Nutrition Follow-up  Diet Order:  NPO, Jevity 1.2 @ 25 ml/h Provides 720 kcal, 33 gm protein  Meds: Scheduled Meds:   . amiodarone  200 mg Oral Daily  . antiseptic oral rinse  15 mL Mouth Rinse 6 X Daily  . carvedilol  6.25 mg Oral BID WC  . chlorhexidine  15 mL Mouth Rinse BID  . clopidogrel  75 mg Oral Q breakfast  . cyanocobalamin  1,000 mcg Intramuscular Weekly  . furosemide  40 mg Intravenous Q8H  . furosemide  40 mg Intravenous Q8H  . levalbuterol  0.63 mg Nebulization TID  . lisinopril  2.5 mg Oral Daily  . pantoprazole sodium  40 mg Per Tube Daily  . piperacillin-tazobactam (ZOSYN)  IV  3.375 g Intravenous Q8H  . potassium chloride  40 mEq Per Tube TID  . potassium chloride  40 mEq Per Tube TID  . rosuvastatin  10 mg Oral Daily  . sodium chloride      . sodium chloride      . vancomycin  1,000 mg Intravenous Q24H  . warfarin  5 mg Oral ONCE-1800  . warfarin  7.5 mg Oral ONCE-1800  . DISCONTD: albuterol  2.5 mg Nebulization Once  . DISCONTD: carvedilol  3.125 mg Oral BID WC  . DISCONTD: pantoprazole (PROTONIX) IV  40 mg Intravenous Daily  . DISCONTD: potassium chloride  40 mEq Per Tube Once   Continuous Infusions:   . feeding supplement (JEVITY 1.2) 1,000 mL (01/22/11 1510)  . heparin     PRN Meds:.sodium chloride, acetaminophen, bisacodyl, levalbuterol, magnesium hydroxide, DISCONTD: fentaNYL, DISCONTD: midazolam  Labs:  CMP     Component Value Date/Time   NA 140 01/23/2011 0409   K 3.3* 01/23/2011 0409   CL 103 01/23/2011 0409   CO2 28 01/23/2011 0409   GLUCOSE 142* 01/23/2011 0409   BUN 8 01/23/2011 0409   CREATININE 0.80 01/23/2011 0409   CALCIUM 9.0 01/23/2011 0409   PROT 6.8 01/13/2011 0615   ALBUMIN 2.5* 01/13/2011 0615   AST 115* 01/13/2011 0615   ALT 297* 01/13/2011 0615   ALKPHOS 120* 01/13/2011 0615   BILITOT 1.2 01/13/2011 0615   GFRNONAA 75* 01/23/2011 0409   GFRAA 86* 01/23/2011 0409     Intake/Output Summary (Last 24 hours) at  01/23/11 1117 Last data filed at 01/23/11 1039  Gross per 24 hour  Intake   1795 ml  Output   3950 ml  Net  -2155 ml   Spoke with patient about TF, tolerating well. Per SLP, FEES study today. If patient unable to transition to PO soon, may consider PEG, per physician. Currently TF is meeting 65% of kcal needs and 60% of protein needs.    Weight Status:  95 lbs, down 15 lbs since admission. Partially related to negative fluid balance (patient on lasix).   Nutrition Dx:  Inadequate oral intake continues  Goal: Meet >90% of estimated nutrition needs with PO diet or EN initiation, Met New goal: >90% of estimated nutrition needs meet by EN  Intervention:  If patient is unable to transition to PO diet and remains on EN, recommend increasing Jevity 1.2 to goal rate of 40 ml/h to provide 1152 kcal and 53 gm protein.   Monitor:  EN rate and tolerance, weight, labs   Clarene Duke MARIE Pager #:  [phone]

## 2011-01-23 NOTE — Consult Note (Signed)
Subjective: Patient working with PT.   With trach Objective: Filed Vitals:   01/22/11 2348 01/23/11 0050 01/23/11 0427 01/23/11 0801  BP: 110/66  106/85 124/73  Pulse: 106 104 103 95  Temp: 98.5 F (36.9 C)  98.5 F (36.9 C)   TempSrc: Oral  Oral   Resp: 20 24 22  32  Height:      Weight:   95 lb 3.8 oz (43.2 kg)   SpO2: 100% 100% 100% 100%   Weight change: 0 lb (0 kg)  Intake/Output Summary (Last 24 hours) at 01/23/11 0852 Last data filed at 01/23/11 0600  Gross per 24 hour  Intake   1590 ml  Output   4280 ml  Net  -2690 ml    General: Frail.  IN NAD HEENT:  Heart: Regular rate and rhythm,  Lungs: Moving air.  No definite rales..  Ext:  No edema.  Lab Results: Results for orders placed during the hospital encounter of 01/01/11 (from the past 24 hour(s))  CBC     Status: Abnormal   Collection Time   01/23/11  4:09 AM      Component Value Range   WBC 9.0  4.0 - 10.5 (K/uL)   RBC 4.27  3.87 - 5.11 (MIL/uL)   Hemoglobin 11.3 (*) 12.0 - 15.0 (g/dL)   HCT 24.4 (*) 01.0 - 46.0 (%)   MCV 82.9  78.0 - 100.0 (fL)   MCH 26.5  26.0 - 34.0 (pg)   MCHC 31.9  30.0 - 36.0 (g/dL)   RDW 27.2 (*) 53.6 - 15.5 (%)   Platelets 188  150 - 400 (K/uL)  PROTIME-INR     Status: Abnormal   Collection Time   01/23/11  4:09 AM      Component Value Range   Prothrombin Time 22.7 (*) 11.6 - 15.2 (seconds)   INR 1.96 (*) 0.00 - 1.49   BASIC METABOLIC PANEL     Status: Abnormal   Collection Time   01/23/11  4:09 AM      Component Value Range   Sodium 140  135 - 145 (mEq/L)   Potassium 3.3 (*) 3.5 - 5.1 (mEq/L)   Chloride 103  96 - 112 (mEq/L)   CO2 28  19 - 32 (mEq/L)   Glucose, Bld 142 (*) 70 - 99 (mg/dL)   BUN 8  6 - 23 (mg/dL)   Creatinine, Ser 6.44  0.50 - 1.10 (mg/dL)   Calcium 9.0  8.4 - 03.4 (mg/dL)   GFR calc non Af Amer 75 (*) >90 (mL/min)   GFR calc Af Amer 86 (*) >90 (mL/min)  MAGNESIUM     Status: Normal   Collection Time   01/23/11  4:09 AM      Component Value Range     Magnesium 2.0  1.5 - 2.5 (mg/dL)  PHOSPHORUS     Status: Normal   Collection Time   01/23/11  4:09 AM      Component Value Range   Phosphorus 3.4  2.3 - 4.6 (mg/dL)    Studies/Results: Dg Chest Port 1 View  01/23/2011  *RADIOLOGY REPORT*  Clinical Data: 65 year old female with respiratory distress, respiratory failure.  PORTABLE CHEST - 1 VIEW  Comparison: 01/22/2011 earlier.  Findings: Semi upright AP portable view 0700 hours.  Stable tracheostomy tube.  Enteric tube courses to the abdomen, tip not included.  Stable right PICC line.  Stable lung volumes.  Right greater than left pleural effusions are stable or mildly progressed since 01/18/2011.  Patchy perihilar pulmonary opacity also is mildly progressed over the same time. No significant change since yesterday.  No pneumothorax.  No pulmonary edema.  IMPRESSION: 1. Stable lines and tubes. 2. No significant change since yesterday.  Right greater than left pleural effusions and perihilar opacity.  Original Report Authenticated By: Harley Hallmark, M.D.   Dg Chest Port 1 View  01/22/2011  *RADIOLOGY REPORT*  Clinical Data: Respiratory failure  PORTABLE CHEST - 1 VIEW  Comparison: 01/20/2011  Findings: Tracheostomy remains in good position.  Right arm PICC tip in the right atrium, unchanged.  Increase in right lower lobe atelectasis. Right lower lobe airspace disease may represent pneumonia and is unchanged.  Right pleural effusion unchanged.  Left lower lobe atelectasis unchanged.  Negative for heart failure.  Feeding tube has been placed with  the tip not visualized.  IMPRESSION: Progression of right lower lobe atelectasis.  Possible right lower lobe pneumonia.  Original Report Authenticated By: Camelia Phenes, M.D.    Medications: I have reviewed the patient's current medications.   Patient Active Hospital Problem List: Femur fracture, right (01/01/2011)   Assessment:    Plan:  HTN (hypertension) (12/24/2010)   Assessment: BP in 120s.   WIth increased HR would increase coreg to 6.25 bid   Plan: Femoral DVT (deep venous thrombosis) (12/25/2010)   Assessment: Coumadin   Plan:  Cardiogenic shock (01/06/2011)   Assessment: Improved.   Plan: MI (myocardial infarction) (01/09/2011)   Assessment: s/p MI with cardiac arrest.  BMS to RCA.  COntinue Plavix.  NO ASA since on coumadin.  D/C plavix after 30 days and resume ASA.   VT:  No recurrence.  Continue Amio 200.  D/C in 6 to 8 wks if remains in SR.   Plan:  Respiratory failure (01/17/2011)   Assessment: with trach   Plan Aspiration pneumonia (01/17/2011)   Assessment: Manage per CCM.     Plan:  Ischemic cardiomyopathy (01/21/2011)   Assessment: Volume not bad  Continue meds.  Echo in 3 months.   Plan:    LOS: 22 days   Dietrich Pates 01/23/2011, 8:52 AM

## 2011-01-23 NOTE — Progress Notes (Signed)
CSW will fax pt out with updated FL2 and will follow to facilitate d/c planning as pt progresses medically.  Baxter Flattery, MSW [phone]

## 2011-01-24 ENCOUNTER — Inpatient Hospital Stay (HOSPITAL_COMMUNITY): Payer: Medicare Other

## 2011-01-24 DIAGNOSIS — I4891 Unspecified atrial fibrillation: Secondary | ICD-10-CM

## 2011-01-24 LAB — BASIC METABOLIC PANEL
BUN: 13 mg/dL (ref 6–23)
CO2: 28 mEq/L (ref 19–32)
Calcium: 8.7 mg/dL (ref 8.4–10.5)
GFR calc non Af Amer: 68 mL/min — ABNORMAL LOW (ref >90)
Glucose, Bld: 122 mg/dL — ABNORMAL HIGH (ref 70–99)
Potassium: 3.6 mEq/L (ref 3.5–5.1)

## 2011-01-24 LAB — CBC
HCT: 33.6 % — ABNORMAL LOW (ref 36.0–46.0)
Hemoglobin: 10.6 g/dL — ABNORMAL LOW (ref 12.0–15.0)
MCH: 26.8 pg (ref 26.0–34.0)
MCHC: 31.5 g/dL (ref 30.0–36.0)

## 2011-01-24 MED ORDER — MIDAZOLAM HCL 2 MG/2ML IJ SOLN
2.0000 mg | Freq: Once | INTRAMUSCULAR | Status: AC
  Administered 2011-01-24: 2 mg via INTRAVENOUS

## 2011-01-24 MED ORDER — FUROSEMIDE 10 MG/ML IJ SOLN
40.0000 mg | Freq: Once | INTRAMUSCULAR | Status: DC
  Filled 2011-01-24 (×2): qty 4

## 2011-01-24 MED ORDER — POTASSIUM CHLORIDE 20 MEQ/15ML (10%) PO LIQD
40.0000 meq | Freq: Three times a day (TID) | ORAL | Status: AC
  Administered 2011-01-24 (×3): 40 meq
  Filled 2011-01-24 (×3): qty 30

## 2011-01-24 MED ORDER — FENTANYL CITRATE 0.05 MG/ML IJ SOLN
100.0000 ug | Freq: Once | INTRAMUSCULAR | Status: AC
  Administered 2011-01-24: 100 ug via INTRAVENOUS

## 2011-01-24 MED ORDER — POTASSIUM CHLORIDE 20 MEQ/15ML (10%) PO LIQD
ORAL | Status: AC
  Filled 2011-01-24: qty 30

## 2011-01-24 NOTE — Progress Notes (Signed)
Pharmacy - Anticoagulation for RLE DVT  Pt with copious amounts of blood from trach this a.m. prior to bronch. During bronch, large amount of blood from trachea and R mainstem bronchus were removed.   Spoke with Dr. Molli Knock, hold heparin and coumadin for now with trach bleeding. Pharmacy will continue to follow peripherally but will d/c protocols. If plan to restart anticoagulation, MD will need to reorder protocols.  Thanks, Christoper Fabian, PharmD, BCPS 01/24/11       1300

## 2011-01-24 NOTE — Progress Notes (Signed)
Speech Language Pathology SLP Cancellation Note  Treatment cancelled today due to medical issues with patient which prohibited therapy.  Patient undergoing bronchoscopy at time of visit. Will resume PMSV trials as patient able to tolerate.  Will likely f/u on Monday 12/31.   Myra Rude, M.S.,CCC-SLP Pager [phone]

## 2011-01-24 NOTE — Progress Notes (Signed)
CSW updated FL2 and re-faxed out patient to facilities.

## 2011-01-24 NOTE — Progress Notes (Signed)
Pt began to show signs of resp distress this Friday am (12/28). NTS suctioned Pt, bloody sputum present. RR 30s-40s.Sats dropped to 30%.  Called RT and RR. RT took trach collar up to 100%. PCCM ordered ICU bed, Pt moved to 2901. Called family to aprise them of situation.

## 2011-01-24 NOTE — Progress Notes (Signed)
Called at 0830  By RN due to patient on trach collar and having lots of bloody secretions.  RRT on previous call and advised RN to call RT and MD and check vitals.  Arrived at 0845 patient in Respiratory distress, unable to ventilate, bilateral  breath sounds moving very little air.  repositioned patient to open airway attempted ambu bag difficult to ventilate called for stat pcxr, Dr. Molli Knock at the bedside Bp 210/100 Hr 130's RR 48 using accessory muscles. Patient very lethargic able to nod yes and no, skin hot diaphortic stat 40mg  IV lasix given as per Dr. Molli Knock, stat transport to 2900 Dr Molli Knock remains at bedside.  Patient became apneic enroute, ventilated via ambu bag Handoff report to 2900 staff

## 2011-01-24 NOTE — Procedures (Signed)
Bronchoscopy Procedure Note [name] [id number] Jan 13, 1946  Procedure: Bronchoscopy Indications: Diagnostic evaluation of the airways and Remove secretions  Procedure Details Consent: Unable to obtain consent because of altered level of consciousness. Time Out: Verified patient identification, verified procedure, site/side was marked, verified correct patient position, special equipment/implants available, medications/allergies/relevent history reviewed, required imaging and test results available.  Performed  In preparation for procedure, patient was given 100% FiO2 and bronchoscope lubricated. Sedation: Benzodiazepines and narcotics.  Airway entered and the following bronchi were examined: RUL, RML, RLL, LUL, LLL and Bronchi.   Procedures performed: Brushings performed Bronchoscope removed.  , Patient placed back on 100% FiO2 at conclusion of procedure.    Evaluation Hemodynamic Status: BP stable throughout; O2 sats: stable throughout Patient's Current Condition: stable Specimens:  None Complications: No apparent complications Patient did tolerate procedure well.  Large amounts of clots were removed from the trachea and right mainstem bronchus.  [name] 01/24/2011

## 2011-01-24 NOTE — Progress Notes (Signed)
MEDICATION RELATED CONSULT NOTE - FOLLOW UP   Pharmacy Consult for  Vancomycin Indication: HCAP vs aspiration PNA  No Known Allergies  Patient Measurements: Height: 5' (152.4 cm) Weight: 95 lb 3.8 oz (43.2 kg) IBW/kg (Calculated) : 45.5   Vital Signs: Temp: 99.3 F (37.4 C) (12/27 2101) Temp src: Oral (12/27 2101) BP: 92/59 mmHg (12/27 2101) Pulse Rate: 80  (12/27 2101) Intake/Output from previous day: 12/27 0701 - 12/28 0700 In: 1320 [I.V.:320; NG/GT:375; IV Piggyback:300] Out: 900 [Urine:900] Intake/Output from this shift: Total I/O In: 480 [I.V.:80; NG/GT:100; IV Piggyback:300] Out: -   Labs:  Basename 01/23/11 0409 01/22/11 0400 01/21/11 1750 01/21/11 0400  WBC 9.0 9.0 -- 7.1  HGB 11.3* 9.7* -- 9.0*  HCT 35.4* 29.5* -- 27.5*  PLT 188 144* -- 132*  APTT -- -- -- --  CREATININE 0.80 0.66 0.68 --  LABCREA -- -- -- --  CREATININE 0.80 0.66 0.68 --  CREAT24HRUR -- -- -- --  MG 2.0 -- -- 1.7  PHOS 3.4 -- -- 2.8  ALBUMIN -- -- -- --  PROT -- -- -- --  ALBUMIN -- -- -- --  AST -- -- -- --  ALT -- -- -- --  ALKPHOS -- -- -- --  BILITOT -- -- -- --  BILIDIR -- -- -- --  IBILI -- -- -- --   12/27: Vancomycin trough 14.5  Estimated Creatinine Clearance: 46.5 ml/min (by C-G formula based on Cr of 0.8).    Assessment: 65 yo female on day 8 Vancomycin for HCAP vs aspiration PNA.   Goal of Therapy:  Vancomycin trough 15-20  Plan:  Continue Vancomycin 1 g IV q24h.  F/U plan.  Terre Zabriskie, Gary Fleet 01/24/2011,12:43 AM

## 2011-01-24 NOTE — Consult Note (Signed)
Subjective: NO CP.   No SOB now on vent. Objective: Filed Vitals:   01/24/11 0904 01/24/11 1000 01/24/11 1006 01/24/11 1100  BP: 162/123 119/72  69/52  Pulse: 107 104  103  Temp:      TempSrc:      Resp: 21 20  18   Height:      Weight:      SpO2: 100% 100% 100% 100%   Weight change: -7 lb 15 oz (-3.6 kg)  Intake/Output Summary (Last 24 hours) at 01/24/11 1206 Last data filed at 01/24/11 1000  Gross per 24 hour  Intake   1145 ml  Output   1300 ml  Net   -155 ml    General: Alert, awake, oriented x3, in no acute distress.  HEENT: No bruits, no goiter.  Heart: Regular rate and rhythm, without murmurs, rubs, gallops.  Lungs: Rhonchi Abdomen: Soft, nontender, nondistended, positive bowel sounds.  Ext:  NO edema.  Warm. Neuro: Grossly intact, nonfocal.   Lab Results: Results for orders placed during the hospital encounter of 01/01/11 (from the past 24 hour(s))  GLUCOSE, CAPILLARY     Status: Abnormal   Collection Time   01/23/11  6:50 PM      Component Value Range   Glucose-Capillary 104 (*) 70 - 99 (mg/dL)   Comment 1 Documented in Chart     Comment 2 Notify RN    HEPARIN LEVEL (UNFRACTIONATED)     Status: Normal   Collection Time   01/23/11  6:59 PM      Component Value Range   Heparin Unfractionated 0.61  0.30 - 0.70 (IU/mL)  VANCOMYCIN, TROUGH     Status: Normal   Collection Time   01/23/11 11:30 PM      Component Value Range   Vancomycin Tr 14.5  10.0 - 20.0 (ug/mL)  CBC     Status: Abnormal   Collection Time   01/24/11  6:30 AM      Component Value Range   WBC 8.9  4.0 - 10.5 (K/uL)   RBC 3.95  3.87 - 5.11 (MIL/uL)   Hemoglobin 10.6 (*) 12.0 - 15.0 (g/dL)   HCT 91.4 (*) 78.2 - 46.0 (%)   MCV 85.1  78.0 - 100.0 (fL)   MCH 26.8  26.0 - 34.0 (pg)   MCHC 31.5  30.0 - 36.0 (g/dL)   RDW 95.6 (*) 21.3 - 15.5 (%)   Platelets 194  150 - 400 (K/uL)  BASIC METABOLIC PANEL     Status: Abnormal   Collection Time   01/24/11  6:30 AM      Component Value Range   Sodium 143  135 - 145 (mEq/L)   Potassium 3.6  3.5 - 5.1 (mEq/L)   Chloride 105  96 - 112 (mEq/L)   CO2 28  19 - 32 (mEq/L)   Glucose, Bld 122 (*) 70 - 99 (mg/dL)   BUN 13  6 - 23 (mg/dL)   Creatinine, Ser 0.86  0.50 - 1.10 (mg/dL)   Calcium 8.7  8.4 - 57.8 (mg/dL)   GFR calc non Af Amer 68 (*) >90 (mL/min)   GFR calc Af Amer 79 (*) >90 (mL/min)  PHOSPHORUS     Status: Normal   Collection Time   01/24/11  6:30 AM      Component Value Range   Phosphorus 3.8  2.3 - 4.6 (mg/dL)  MAGNESIUM     Status: Normal   Collection Time   01/24/11  6:30 AM  Component Value Range   Magnesium 2.3  1.5 - 2.5 (mg/dL)    Studies/Results: Dg Chest Port 1 View  01/23/2011  *RADIOLOGY REPORT*  Clinical Data: 65 year old female with respiratory distress, respiratory failure.  PORTABLE CHEST - 1 VIEW  Comparison: 01/22/2011 earlier.  Findings: Semi upright AP portable view 0700 hours.  Stable tracheostomy tube.  Enteric tube courses to the abdomen, tip not included.  Stable right PICC line.  Stable lung volumes.  Right greater than left pleural effusions are stable or mildly progressed since 01/18/2011.  Patchy perihilar pulmonary opacity also is mildly progressed over the same time. No significant change since yesterday.  No pneumothorax.  No pulmonary edema.  IMPRESSION: 1. Stable lines and tubes. 2. No significant change since yesterday.  Right greater than left pleural effusions and perihilar opacity.  Original Report Authenticated By: Harley Hallmark, M.D.    Medications: I have reviewed the patient's current medications.   Patient Active Hospital Problem List: Femur fracture, right (01/01/2011)   Assessment:    Plan:  HTN (hypertension) (12/24/2010)   Assessment: BP low now  Will give fluid now.  Follow.  No signs bleeding.   Plan:  Femoral DVT (deep venous thrombosis) (12/25/2010)   Assessment: Per CCM   Plan:  MI (myocardial infarction) (01/09/2011)   Assessment: See below.    Plan: Respiratory failure (01/17/2011)   Assessment: Note events of this AM  Now back on vent.   Plan:  Aspiration pneumonia (01/17/2011)   Assessment:    Plan:  Ischemic cardiomyopathy (01/21/2011)   Assessment:  S/p PTCA/BMS to RCA.  LVEF 25%.  Plan for medical Rx.  Plavix x 1 month. Afib  Remains in SR on Amio.  Continue for 6 to 8 wks.   Plan:    LOS: 23 days   Dietrich Pates 01/24/2011, 12:06 PM

## 2011-01-24 NOTE — Progress Notes (Signed)
Name: [name] MRN: [id number] DOB: February 17, 1945    LOS: 23  PCCM PROGRESS NOTE  History of Present Illness: 65 y/o F with PMHx of HTN suffered fall on 12/24/10 and LE pain. Initially taken to [facility] and diagnosed with R femoral DVT and started on anticoagulation. Pt had persistent pain and she had further imaging studies which showed R femoral fracture. Pt was transferred to [facility] for further management and was taken to the OR on 01/03/11 and underwent Open reduction and internal fixation of the right femur fracture. Post op suffered episode of Torsades and underwent 7-10 mins CPR. Subsequently developed AFRVR and hypotension. EKG demonstrated ST elevation on the inferior leads. Underwent cardiac cath 12/7. PCCM asked to assist with vent/CCM issues.  Extubated 01/10/11 and transferred out of 2300 12/18.  However on 12/20 developed acute onset shortness of breath, likely due to aspiration, re-intubated.  Lines / Drains: 12/07 L IJ CVL>>> 12/14 12/07 ETT>>> 12/10, 12/11 >>> 12/14 , 12/21 >> 12/21 12/21 perc trach (DF/KZ) >> 12/14 PICC >>>  12/15 Foley>>>  Cultures: 12/10 UC>>>neg 12/10 RC>>>neg 12/21 Sputum>>>NTD. 12/21 blood>>>NTD.  Antibiotics: 12/10 Vancomycin >>> 12/11  12/10 Ceftazidime (fever, purulent sputum, cloudy urine) >>> 12/18 12/21 vanc (HCAP vs. Asp PNA) >> 12/21 Zosyn (HCAP vs. Asp PNA) >> 12/21 Cipro (HCAP vs. 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 Severe left ventricular dysfunction with inferior apical and distal anterior hypokinesis of severe degree. The estimated ejection fraction is 25%.  12/08 CT head: No acute abnormalities  12/08 Echo: Systolic function was moderately to severely reduced. The estimated  ejection fraction was in the range of 30% to 35%. There is akinesis of the apical myocardium. There is akinesis of the inferoposterior myocardium. There is hypokinesis of the lateral myocardium  12/11 Reintubated for sudden onset resp distress  12/11 CTA chest: No evidence of pulmonary embolus. Moderate bilateral effusions with significant consolidation and atelectasis in both lungs. Perihilar infiltration suggesting edema. Indeterminate mass or fluid collection in the right supraclavicular region measuring about 3 x 2.2 cm.  12/14 Extubated 12/20 Acute resp distress, transfer to 3300 12/21 Intubated for aspiration pneumonia  Overnight:   Called by RN in 3300 this AM with the patient acutely decompensating from a respiratory standpoint.  Copious amount of blood from the trach.  Vital Signs: Temp:  [98.1 F (36.7 C)-99.3 F (37.4 C)] 98.1 F (36.7 C) (12/28 0900) Pulse Rate:  [80-126] 107  (12/28 0904) Resp:  [19-34] 21  (12/28 0904) BP: (92-162)/(58-123) 162/123 mmHg (12/28 0904) SpO2:  [100 %] 100 % (12/28 1006) FiO2 (%):  [28 %-100 %] 40.2 % (12/28 1006) Weight:  [42.5 kg (93 lb 11.1 oz)] 93 lb 11.1 oz (42.5 kg) (12/28 0400)   Intake/Output Summary (Last 24 hours) at 01/24/11 1018 Last data filed at 01/24/11 0900  Gross per 24 hour  Intake   1540 ml  Output   1625 ml  Net    -85 ml    Physical Examination: Gen: Clear respiratory distress, panicked appearing. HEENT: NCAT, PERRL, Trach site with blood around it. PULM: No audible BS on the right. CV: Tachycardic, no mgr, no JVD AB: BS+, soft, nontender, no hsm Ext: warm, no edema, no clubbing, no  cyanosis; R PICC site dressing c/d/i Derm: no rash or skin breakdown Neuro: Was following commands but now sedated.  BMET    Component Value Date/Time   NA 143 01/24/2011 0630   K 3.6 01/24/2011 0630   CL 105 01/24/2011 0630   CO2 28 01/24/2011 0630   GLUCOSE 122* 01/24/2011 0630   BUN 13 01/24/2011 0630   CREATININE 0.86  01/24/2011 0630   CALCIUM 8.7 01/24/2011 0630   GFRNONAA 68* 01/24/2011 0630   GFRAA 79* 01/24/2011 0630   CBC    Component Value Date/Time   WBC 8.9 01/24/2011 0630   RBC 3.95 01/24/2011 0630   HGB 10.6* 01/24/2011 0630   HCT 33.6* 01/24/2011 0630   PLT 194 01/24/2011 0630   MCV 85.1 01/24/2011 0630   MCH 26.8 01/24/2011 0630   MCHC 31.5 01/24/2011 0630   RDW 19.1* 01/24/2011 0630   LYMPHSABS 1.9 01/13/2011 0615   MONOABS 0.9 01/13/2011 0615   EOSABS 0.0 01/13/2011 0615   BASOSABS 0.0 01/13/2011 0615   Ventilator settings: Vent Mode:  [-] PRVC FiO2 (%):  [28 %-100 %] 40.2 % Set Rate:  [18 bmp] 18 bmp Vt Set:  [400 mL] 400 mL PEEP:  [4.9 cmH20-7.2 cmH20] 4.9 cmH20  Labs and Imaging:  Reviewed.  Please refer to the Assessment and Plan section for relevant results.  Assessment and Plan:  Ischemic cardiomyopathy, MI, status post torsades cardiac arrest, atrial fibrillation with RVR, hypertension.  Status post bare metal stent placement. Cardiology following.   - Continue ASA, Plavix (d/c after 30 days per Cardiology). - Continue Metoprolol, Lisinopril. - Continue Crestor. - Continue Amiodarone (for 6-8 weeks per Cardiology).  Respiratory Failure: aspiration pneumonia vs. HCAP (WBC up, febrile), likely blood clot in the right mainstem causing respiratory failure. - Place back on vent and will bronch. - Zosyn change to PO Augmentin today. - Cultures are all negative.  Hypokalemia Lab 01/24/11 0630 01/23/11 0409 01/22/11 0400 01/21/11 1750 01/21/11 0400  K 3.6 3.3* 3.1* 3.6 2.5*  - Diurese, replace and recheck. - Two low doses of lasix again today.  Right Lower Extremity DVT  - Hold coumadin due to trach bleeding, if persists til Monday will stop coumadin all together and have to place a filter since patient bleeds everytime INR becomes therapeutic.  Anemia, bleeding from trach site on plavix, heparin, ASA  Lab 01/24/11 0630 01/23/11 0409 01/22/11 0400 01/21/11 0400  01/20/11 1546  HCT 33.6* 35.4* 29.5* 27.5* 29.3*  - Check CBC and type/screen this pm. - PRBC for Hgb < 7.0, will recheck H/H post transfusion.  Malnutrition, dysphagia.  Swallowing assessment completed. - Placed panda and maintain until swallow evaluation complete, PEG only if speech do not feel patient will be able to swallow, will try to avoid.  Encephalopathy / delirium, improved - Monitor  Social work consult for placement.  CC time of 45 minutes.  Alyson Reedy, M.D. [facility] Pulmonary/Critical Care Medicine. Pager: [phone]. After hours pager: [phone].

## 2011-01-25 ENCOUNTER — Inpatient Hospital Stay (HOSPITAL_COMMUNITY): Payer: Medicare Other

## 2011-01-25 LAB — BASIC METABOLIC PANEL
CO2: 26 mEq/L (ref 19–32)
Chloride: 113 mEq/L — ABNORMAL HIGH (ref 96–112)
Glucose, Bld: 101 mg/dL — ABNORMAL HIGH (ref 70–99)
Potassium: 4.2 mEq/L (ref 3.5–5.1)
Sodium: 146 mEq/L — ABNORMAL HIGH (ref 135–145)

## 2011-01-25 LAB — CULTURE, RESPIRATORY W GRAM STAIN

## 2011-01-25 LAB — CBC
HCT: 32.3 % — ABNORMAL LOW (ref 36.0–46.0)
MCV: 87.3 fL (ref 78.0–100.0)
RBC: 3.7 MIL/uL — ABNORMAL LOW (ref 3.87–5.11)
RDW: 19.3 % — ABNORMAL HIGH (ref 11.5–15.5)
WBC: 7.8 10*3/uL (ref 4.0–10.5)

## 2011-01-25 LAB — PHOSPHORUS: Phosphorus: 3.3 mg/dL (ref 2.3–4.6)

## 2011-01-25 MED ORDER — ONDANSETRON 8 MG/NS 50 ML IVPB
8.0000 mg | Freq: Three times a day (TID) | INTRAVENOUS | Status: DC | PRN
  Filled 2011-01-25: qty 8

## 2011-01-25 MED ORDER — FUROSEMIDE 8 MG/ML PO SOLN
40.0000 mg | Freq: Two times a day (BID) | ORAL | Status: DC
  Administered 2011-01-25 – 2011-01-27 (×5): 40 mg via ORAL
  Filled 2011-01-25 (×7): qty 5

## 2011-01-25 MED ORDER — FENTANYL CITRATE 0.05 MG/ML IJ SOLN
25.0000 ug | INTRAMUSCULAR | Status: DC | PRN
  Administered 2011-01-25: 25 ug via INTRAVENOUS
  Filled 2011-01-25: qty 2

## 2011-01-25 MED ORDER — ONDANSETRON HCL 4 MG/2ML IJ SOLN: 4.0000 mg | Freq: Three times a day (TID) | INTRAMUSCULAR | Status: DC | PRN

## 2011-01-25 MED ORDER — ONDANSETRON HCL 4 MG/2ML IJ SOLN
4.0000 mg | Freq: Three times a day (TID) | INTRAMUSCULAR | Status: DC | PRN
  Administered 2011-01-25 – 2011-01-26 (×2): 4 mg via INTRAVENOUS
  Filled 2011-01-25 (×2): qty 2

## 2011-01-25 NOTE — Progress Notes (Signed)
Name: [name] MRN: [id number] DOB: March 25, 1945    LOS: 24  PCCM PROGRESS NOTE  History of Present Illness: 65 y/o F with PMHx of HTN suffered fall on 12/24/10 and LE pain. Initially taken to [facility] and diagnosed with R femoral DVT and started on anticoagulation. Pt had persistent pain and she had further imaging studies which showed R femoral fracture. Pt was transferred to [facility] for further management and was taken to the OR on 01/03/11 and underwent Open reduction and internal fixation of the right femur fracture. Post op suffered episode of Torsades and underwent 7-10 mins CPR. Subsequently developed AFRVR and hypotension. EKG demonstrated ST elevation on the inferior leads. Underwent cardiac cath 12/7. PCCM asked to assist with vent/CCM issues.  Extubated 01/10/11 and transferred out of 2300 12/18.  However on 12/20 developed acute onset shortness of breath, likely due to aspiration, re-intubated.  Lines / Drains: 12/07 L IJ CVL>>> 12/14 12/07 ETT>>> 12/10, 12/11 >>> 12/14 , 12/21 >> 12/21 12/21 perc trach (DF/KZ) >> 12/14 PICC >>>  12/15 Foley>>>  Cultures: 12/10 UC>>>neg 12/10 RC>>>neg 12/21 Sputum>>>NTD. 12/21 blood>>>NTD.  Antibiotics: 12/10 Vancomycin >>> 12/11  12/10 Ceftazidime (fever, purulent sputum, cloudy urine) >>> 12/18 12/21 vanc (HCAP vs. Asp PNA) >>12/29 12/21 Zosyn (HCAP vs. Asp PNA) >> 12/21 Cipro (HCAP vs. 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 Severe left ventricular dysfunction with inferior apical and distal anterior hypokinesis of severe degree. The estimated ejection fraction is 25%.  12/08 CT head: No acute abnormalities  12/08 Echo: Systolic function was moderately to severely reduced. The  estimated ejection fraction was in the range of 30% to 35%. There is akinesis of the apical myocardium. There is akinesis of the inferoposterior myocardium. There is hypokinesis of the lateral myocardium  12/11 Reintubated for sudden onset resp distress  12/11 CTA chest: No evidence of pulmonary embolus. Moderate bilateral effusions with significant consolidation and atelectasis in both lungs. Perihilar infiltration suggesting edema. Indeterminate mass or fluid collection in the right supraclavicular region measuring about 3 x 2.2 cm.  12/14 Extubated 12/20 Acute resp distress, transfer to 3300 12/21 Intubated for aspiration pneumonia  Overnight:   Called by RN in 3300 this AM with the patient acutely decompensating from a respiratory standpoint.  Copious amount of blood from the trach.  Vital Signs: Temp:  [97.5 F (36.4 C)-99.4 F (37.4 C)] 97.5 F (36.4 C) (12/29 0400) Pulse Rate:  [76-126] 84  (12/29 0714) Resp:  [18-35] 26  (12/29 0714) BP: (69-162)/(34-123) 113/69 mmHg (12/29 0600) SpO2:  [95 %-100 %] 99 % (12/29 0718) FiO2 (%):  [28 %-100 %] 28 % (12/29 0718) Weight:  [39 kg (85 lb 15.7 oz)] 85 lb 15.7 oz (39 kg) (12/29 0500)   Intake/Output Summary (Last 24 hours) at 01/25/11 0758 Last data filed at 01/25/11 0600  Gross per 24 hour  Intake   1245 ml  Output   1400 ml  Net   -155 ml    Physical Examination: Gen: Chronically ill appearing, NAD. HEENT: NCAT, PERRL, Trach site with blood around it. PULM: Ronchi diffusely. CV: RRR, Nl S1/S2, -M/R/G. AB: BS+, soft, nontender, no hsm Ext: warm, no edema, no clubbing, no cyanosis; R PICC site dressing c/d/i  Derm: no rash or skin breakdown Neuro: Was following commands but now sedated.  BMET    Component Value Date/Time   NA 146* 01/25/2011 0500   K 4.2 01/25/2011 0500   CL 113* 01/25/2011 0500   CO2 26 01/25/2011 0500   GLUCOSE 101* 01/25/2011 0500   BUN 15 01/25/2011 0500   CREATININE 0.96 01/25/2011 0500   CALCIUM  8.6 01/25/2011 0500   GFRNONAA 60* 01/25/2011 0500   GFRAA 69* 01/25/2011 0500   CBC    Component Value Date/Time   WBC 7.8 01/25/2011 0500   RBC 3.70* 01/25/2011 0500   HGB 9.9* 01/25/2011 0500   HCT 32.3* 01/25/2011 0500   PLT 192 01/25/2011 0500   MCV 87.3 01/25/2011 0500   MCH 26.8 01/25/2011 0500   MCHC 30.7 01/25/2011 0500   RDW 19.3* 01/25/2011 0500   LYMPHSABS 1.9 01/13/2011 0615   MONOABS 0.9 01/13/2011 0615   EOSABS 0.0 01/13/2011 0615   BASOSABS 0.0 01/13/2011 0615   Ventilator settings: Vent Mode:  [-] PRVC FiO2 (%):  [28 %-100 %] 28 % Set Rate:  [18 bmp] 18 bmp Vt Set:  [400 mL] 400 mL PEEP:  [0.1 cmH20-7.2 cmH20] 0.1 cmH20  Labs and Imaging:  Reviewed.  Please refer to the Assessment and Plan section for relevant results.  Assessment and Plan:  Ischemic cardiomyopathy, MI, status post torsades cardiac arrest, atrial fibrillation with RVR, hypertension.  Status post bare metal stent placement. Cardiology following.   - Continue ASA, Plavix (d/c after 30 days per Cardiology). - Continue Metoprolol, Lisinopril. - Continue Crestor. - Continue Amiodarone (for 6-8 weeks per Cardiology).  Respiratory Failure: aspiration pneumonia vs. HCAP (WBC up, febrile), Patient had significant clots in the right mainstem that was removed bronchoscopically. - Maintain on TC. - D/C Vanc today, if remains afebrile by AM and WBC is stable will change to PO Augmentin and d/c zosyn for total abx days of 14. - Cultures are all negative.  Hypokalemia  Lab 01/25/11 0500 01/24/11 0630 01/23/11 0409 01/22/11 0400 01/21/11 1750  K 4.2 3.6 3.3* 3.1* 3.6  - Change lasix to PO to maintain even at this point.  Right Lower Extremity DVT  - Heparin and coumadin on hold due to 12/28 episode and patient is still having bloody tracheal aspirate.  If persists til Monday then anti-coag will no longer be an option and will need to place filter for DVT.  Anemia, bleeding from trach site on  plavix, heparin, ASA  Lab 01/25/11 0500 01/24/11 0630 01/23/11 0409 01/22/11 0400 01/21/11 0400  HCT 32.3* 33.6* 35.4* 29.5* 27.5*  - Check CBC and type/screen this pm. - PRBC for Hgb < 7.0.  Malnutrition, dysphagia.  Swallowing assessment completed. - Placed panda and maintain until swallow evaluation complete, PEG only if speech do not feel patient will be able to swallow, will try to avoid.  Encephalopathy / delirium, improved - Monitor  Social work consult for placement next week.  Alyson Reedy, M.D. [facility] Pulmonary/Critical Care Medicine. Pager: [phone]. After hours pager: [phone].

## 2011-01-26 DIAGNOSIS — Z9911 Dependence on respirator [ventilator] status: Secondary | ICD-10-CM

## 2011-01-26 DIAGNOSIS — I469 Cardiac arrest, cause unspecified: Secondary | ICD-10-CM

## 2011-01-26 DIAGNOSIS — J9600 Acute respiratory failure, unspecified whether with hypoxia or hypercapnia: Secondary | ICD-10-CM

## 2011-01-26 DIAGNOSIS — R579 Shock, unspecified: Secondary | ICD-10-CM

## 2011-01-26 LAB — PROTIME-INR: INR: 2.81 — ABNORMAL HIGH (ref 0.00–1.49)

## 2011-01-26 LAB — BASIC METABOLIC PANEL
BUN: 17 mg/dL (ref 6–23)
Creatinine, Ser: 0.93 mg/dL (ref 0.50–1.10)
GFR calc Af Amer: 72 mL/min — ABNORMAL LOW (ref >90)
GFR calc non Af Amer: 62 mL/min — ABNORMAL LOW (ref >90)
Potassium: 2.8 mEq/L — ABNORMAL LOW (ref 3.5–5.1)

## 2011-01-26 LAB — CBC
HCT: 35.4 % — ABNORMAL LOW (ref 36.0–46.0)
Hemoglobin: 10.8 g/dL — ABNORMAL LOW (ref 12.0–15.0)
WBC: 9.7 10*3/uL (ref 4.0–10.5)

## 2011-01-26 MED ORDER — ATROPINE SULFATE 1 MG/ML IJ SOLN
INTRAMUSCULAR | Status: AC
  Administered 2011-01-26: 0.5 mg
  Filled 2011-01-26: qty 1

## 2011-01-26 MED ORDER — AMOXICILLIN-POT CLAVULANATE 600-42.9 MG/5ML PO SUSR
600.0000 mg | Freq: Two times a day (BID) | ORAL | Status: DC
  Administered 2011-01-26 – 2011-01-28 (×5): 600 mg via ORAL
  Filled 2011-01-26 (×6): qty 5

## 2011-01-26 MED ORDER — POTASSIUM CHLORIDE 20 MEQ/15ML (10%) PO LIQD
40.0000 meq | Freq: Three times a day (TID) | ORAL | Status: AC
  Administered 2011-01-26 (×3): 40 meq
  Filled 2011-01-26 (×3): qty 30

## 2011-01-26 MED ORDER — CYANOCOBALAMIN 1000 MCG/ML IJ SOLN
1000.0000 ug | INTRAMUSCULAR | Status: DC
  Administered 2011-01-29: 1000 ug via SUBCUTANEOUS
  Filled 2011-01-26 (×2): qty 1

## 2011-01-26 NOTE — Progress Notes (Signed)
Name: Tunisia Landgrebe MRN: [id number] DOB: [birth year]    LOS: 25  PCCM PROGRESS NOTE  History of Present Illness: 65 y/o F with PMHx of HTN suffered fall on 12/24/10 and LE pain. Initially taken to [facility] and diagnosed with R femoral DVT and started on anticoagulation. Pt had persistent pain and she had further imaging studies which showed R femoral fracture. Pt was transferred to [facility] for further management and was taken to the OR on 01/03/11 and underwent Open reduction and internal fixation of the right femur fracture. Post op suffered episode of Torsades and underwent 7-10 mins CPR. Subsequently developed AFRVR and hypotension. EKG demonstrated ST elevation on the inferior leads. Underwent cardiac cath 12/7. PCCM asked to assist with vent/CCM issues.  Extubated 01/10/11 and transferred out of 2300 12/18.  However on 12/20 developed acute onset shortness of breath, likely due to aspiration, re-intubated.  Lines / Drains: 12/07 L IJ CVL>>> 12/14 12/07 ETT>>> 12/10, 12/11 >>> 12/14 , 12/21 >> 12/21 12/21 perc trach (DF/KZ) >> 12/14 PICC >>>  12/15 Foley>>>  Cultures: 12/10 UC>>>neg 12/10 RC>>>neg 12/21 Sputum>>>NTD. 12/21 blood>>>NTD.  Antibiotics: 12/10 Vancomycin >>> 12/11  12/10 Ceftazidime (fever, purulent sputum, cloudy urine) >>> 12/18 12/21 vanc (HCAP vs. Asp PNA) >>12/29 12/21 Zosyn (HCAP vs. Asp PNA) >> 12/21 Cipro (HCAP vs. 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 Severe left ventricular dysfunction with inferior apical and distal anterior hypokinesis of severe degree. The estimated ejection fraction is 25%.  12/08 CT head: No acute abnormalities  12/08 Echo: Systolic function was moderately to severely reduced. The  estimated ejection fraction was in the range of 30% to 35%. There is akinesis of the apical myocardium. There is akinesis of the inferoposterior myocardium. There is hypokinesis of the lateral myocardium  12/11 Reintubated for sudden onset resp distress  12/11 CTA chest: No evidence of pulmonary embolus. Moderate bilateral effusions with significant consolidation and atelectasis in both lungs. Perihilar infiltration suggesting edema. Indeterminate mass or fluid collection in the right supraclavicular region measuring about 3 x 2.2 cm.  12/14 Extubated 12/20 Acute resp distress, transfer to 3300 12/21 Intubated for aspiration pneumonia  Overnight:   Episode of mucous plugging overnight with desat and bradycardia requiring one dose of atropine.  Resolved with removal of plug.  Vital Signs: Temp:  [98 F (36.7 C)-99.1 F (37.3 C)] 98.4 F (36.9 C) (12/30 0800) Pulse Rate:  [75-93] 93  (12/30 0800) Resp:  [13-29] 24  (12/30 0800) BP: (76-175)/(40-78) 112/74 mmHg (12/30 0800) SpO2:  [84 %-100 %] 99 % (12/30 0800) FiO2 (%):  [28 %-100 %] 28 % (12/30 0800) Weight:  [40.1 kg (88 lb 6.5 oz)] 88 lb 6.5 oz (40.1 kg) (12/30 0400)   Intake/Output Summary (Last 24 hours) at 01/26/11 1034 Last data filed at 01/26/11 0900  Gross per 24 hour  Intake   1020 ml  Output   1400 ml  Net   -380 ml    Physical Examination: Gen: Chronically ill appearing, NAD. HEENT: NCAT, PERRL, Trach site with blood around it but appears as old blood. PULM: Ronchi diffusely. CV: RRR, Nl S1/S2, -M/R/G. AB: BS+, soft, nontender, no hsm Ext: warm, no edema, no clubbing, no cyanosis; R PICC site dressing  c/d/i Derm: no rash or skin breakdown Neuro: Was following commands but now sedated.  BMET    Component Value Date/Time   NA 145 01/26/2011 0620   K 2.8* 01/26/2011 0620   CL 109 01/26/2011 0620   CO2 29 01/26/2011 0620   GLUCOSE 152* 01/26/2011 0620   BUN 17 01/26/2011 0620   CREATININE 0.93 01/26/2011 0620     CALCIUM 8.9 01/26/2011 0620   GFRNONAA 62* 01/26/2011 0620   GFRAA 72* 01/26/2011 0620   CBC    Component Value Date/Time   WBC 9.7 01/26/2011 0620   RBC 4.09 01/26/2011 0620   HGB 10.8* 01/26/2011 0620   HCT 35.4* 01/26/2011 0620   PLT 195 01/26/2011 0620   MCV 86.6 01/26/2011 0620   MCH 26.4 01/26/2011 0620   MCHC 30.5 01/26/2011 0620   RDW 18.5* 01/26/2011 0620   LYMPHSABS 1.9 01/13/2011 0615   MONOABS 0.9 01/13/2011 0615   EOSABS 0.0 01/13/2011 0615   BASOSABS 0.0 01/13/2011 0615   Ventilator settings: Vent Mode:  [-]  FiO2 (%):  [28 %-100 %] 28 %  Labs and Imaging:  Reviewed.  Please refer to the Assessment and Plan section for relevant results.  Assessment and Plan:  Ischemic cardiomyopathy, MI, status post torsades cardiac arrest, atrial fibrillation with RVR, hypertension.  Status post bare metal stent placement. Cardiology following.   - Continue ASA, Plavix (d/c after 30 days per Cardiology). - Continue Metoprolol, Lisinopril. - Continue Crestor. - Continue Amiodarone (for 6-8 weeks per Cardiology).  Respiratory Failure: aspiration pneumonia vs. HCAP (WBC up, febrile), Patient had significant clots in the right mainstem that was removed bronchoscopically. - Maintain on TC. - D/C zosyn today and start augmentin PO, needs total abx days of 14 days. - Cultures are all negative.  Hypokalemia  Lab 01/26/11 0620 01/25/11 0500 01/24/11 0630 01/23/11 0409 01/22/11 0400  K 2.8* 4.2 3.6 3.3* 3.1*  - Change lasix to PO to maintain even at this point.  Right Lower Extremity DVT  - Heparin and coumadin on hold due to 12/28 episode and patient is still having bloody tracheal aspirate.  If persists til Monday then anti-coag will no longer be an option and will need to place filter for DVT.  Anemia, bleeding from trach site on plavix, heparin, ASA  Lab 01/26/11 0620 01/25/11 0500 01/24/11 0630 01/23/11 0409 01/22/11 0400  HCT 35.4* 32.3* 33.6* 35.4* 29.5*  - Check  CBC and type/screen this pm. - PRBC for Hgb < 7.0.  Malnutrition, dysphagia.  Swallowing assessment completed. - Placed panda and maintain until swallow evaluation complete, PEG only if speech do not feel patient will be able to swallow, will try to avoid, speech to see in AM.  Encephalopathy / delirium, improved - Monitor  Social work consult for placement next week.  Alyson Reedy, M.D. [facility]. Pager: [phone]. After hours pager: [phone].

## 2011-01-26 NOTE — Progress Notes (Signed)
ANTICOAGULATION & ANTIBIOTIC CONSULT NOTE - Follow Up Consult  Pharmacy Consult for  Anticoagulants on hold/ Zoysn #10 Indication:  RLE DVT/  Aspiration PNA vs. HCAP  No Known Allergies  Patient Measurements: Height: 5' (152.4 cm) Weight: 88 lb 6.5 oz (40.1 kg) IBW/kg (Calculated) : 45.5   Vital Signs: Temp: 98.4 F (36.9 C) (12/30 0800) Temp src: Oral (12/30 0800) BP: 116/96 mmHg (12/30 1000) Pulse Rate: 78  (12/30 1121)  Labs:  Basename 01/26/11 0620 01/25/11 0500 01/24/11 0630 01/23/11 1859  HGB 10.8* 9.9* -- --  HCT 35.4* 32.3* 33.6* --  PLT 195 192 194 --  APTT -- -- -- --  LABPROT 30.0* 30.6* -- --  INR 2.81* 2.88* -- --  HEPARINUNFRC -- -- -- 0.61  CREATININE 0.93 0.96 0.86 --  CKTOTAL -- -- -- --  CKMB -- -- -- --  TROPONINI -- -- -- --   Estimated Creatinine Clearance: 37.2 ml/min (by C-G formula based on Cr of 0.93).  Assessment:     Anticoagulation on hold since 12/28 due bloody secretions from trach.  INR remains therapeutic. Last Coumadin dose given 12/27.  On ASA & Plavix (planned for 30 days).  May need IVC filter if bleeding persists.      Zosyn changed to Augmentin today. Planning 14 days of antibiotics.   Plan:    Will f/u AM PT/INR and Coumadin plans.   No current active protocol for Rx to dose Coumadin; Would need re-order if Coumadin to resume.     Dennie Fetters Pager: [phone] 01/26/2011,12:00 PM

## 2011-01-26 NOTE — Progress Notes (Signed)
Pt c episode of hypoxia/bradycardia. Got atropine x 1 Suction with some secretions out. Spon recovery of SpO2. ?AMS briefly but now back to baseline. A: Likley mucous plugging. No additional actions.  Joie Bimler MD, MPH

## 2011-01-26 NOTE — Plan of Care (Signed)
Problem: Discharge Progression Outcomes Goal: Tolerating diet Outcome: Progressing No residuals via Panda ,yet Pt required zofran 8mg  IV for nausea to subside this shift.  Problem: Phase II Progression Outcomes Goal: Vital signs remain stable Outcome: Progressing Cardiac monitor denotes SR,VSS.O2 stas = 97 % - 100% on 28% trache collar. Very scant secretions ( dark,bloody) from trache. Goal: IV changed to normal saline lock NS @ KVO via PICC (RUE).

## 2011-01-26 NOTE — Progress Notes (Signed)
Cardiac monitor alarmed w/HR @ 39. B/p = 76/40,O2 sats = 84%the patient w/head turned to RT side w/deep  resps. Pupils constricted.Pt did not respond to nurse.Trache found w/no plug. Atropine 0.5 mg given IV .Inner cannula removed and trache sx'd( very scant dark red secretions(as at earlier trach care and sx'ing--last at 0345am),then Pt's trache given 100% O2 via ambu bag .Please see VS section .)O2 sats dropped to as low as 44% just prior to ambu .oxygen sats = 100 % @ 05:28 am .Pt w/reactive pupils @ 05:30 am, and able to look at nurse to her side .Pt shook head (no) when handed the white board to write .When asked if she felt too tired to write,Pt nodded(yes).E-Link Dr aware of all the above.

## 2011-01-27 ENCOUNTER — Inpatient Hospital Stay (HOSPITAL_COMMUNITY): Payer: Medicare Other

## 2011-01-27 DIAGNOSIS — I469 Cardiac arrest, cause unspecified: Secondary | ICD-10-CM

## 2011-01-27 DIAGNOSIS — Z930 Tracheostomy status: Secondary | ICD-10-CM

## 2011-01-27 DIAGNOSIS — R1310 Dysphagia, unspecified: Secondary | ICD-10-CM

## 2011-01-27 DIAGNOSIS — J9600 Acute respiratory failure, unspecified whether with hypoxia or hypercapnia: Secondary | ICD-10-CM

## 2011-01-27 LAB — CBC
MCH: 26.7 pg (ref 26.0–34.0)
MCV: 86.8 fL (ref 78.0–100.0)
Platelets: 223 10*3/uL (ref 150–400)
RDW: 18.5 % — ABNORMAL HIGH (ref 11.5–15.5)
WBC: 11.2 10*3/uL — ABNORMAL HIGH (ref 4.0–10.5)

## 2011-01-27 LAB — BASIC METABOLIC PANEL
BUN: 17 mg/dL (ref 6–23)
Calcium: 9.2 mg/dL (ref 8.4–10.5)
Creatinine, Ser: 0.85 mg/dL (ref 0.50–1.10)
GFR calc non Af Amer: 69 mL/min — ABNORMAL LOW (ref >90)
Glucose, Bld: 145 mg/dL — ABNORMAL HIGH (ref 70–99)

## 2011-01-27 MED ORDER — FREE WATER
250.0000 mL | Freq: Four times a day (QID) | Status: DC
  Administered 2011-01-28 – 2011-02-03 (×24): 250 mL

## 2011-01-27 MED ORDER — FUROSEMIDE 8 MG/ML PO SOLN
40.0000 mg | Freq: Every day | ORAL | Status: DC
  Administered 2011-01-28 – 2011-02-03 (×7): 40 mg
  Filled 2011-01-27 (×8): qty 5

## 2011-01-27 NOTE — Progress Notes (Signed)
Physical Therapy Treatment Patient Details Name: [name] MRN: [id number] DOB: 04/14/1945 Today's Date: 01/27/2011  PT Assessment/Plan  PT - Assessment/Plan Comments on Treatment Session: Pt able to transfer to recliner but continues to need max (A).  Pt incontinent and needed total (A) to clean while in standing.  Pt now on trach collar 28% SaO2 100%.  Pt having difficulty standing upright and needs constant cues for proper position. PT Plan: Discharge plan remains appropriate PT Frequency: Min 2X/week Follow Up Recommendations: Skilled nursing facility Equipment Recommended: Defer to next venue PT Goals  Acute Rehab PT Goals PT Goal Formulation: With patient Time For Goal Achievement: 2 weeks Pt will go Supine/Side to Sit: with mod assist PT Goal: Supine/Side to Sit - Progress: Met Pt will go Sit to Supine/Side: with mod assist Pt will go Sit to Stand: with mod assist PT Goal: Sit to Stand - Progress: Progressing toward goal Pt will go Stand to Sit: with mod assist PT Goal: Stand to Sit - Progress: Progressing toward goal Pt will Transfer Bed to Chair/Chair to Bed: with mod assist PT Transfer Goal: Bed to Chair/Chair to Bed - Progress: Progressing toward goal Pt will Perform Home Exercise Program: with min assist PT Goal: Perform Home Exercise Program - Progress: Progressing toward goal  PT Treatment Precautions/Restrictions  Precautions Precautions: Fall Required Braces or Orthoses: No Restrictions Weight Bearing Restrictions: Yes RLE Weight Bearing: Touchdown weight bearing Other Position/Activity Restrictions: Right LE ROM without limitations. Mobility (including Balance) Bed Mobility Bed Mobility: Yes Supine to Sit: 3: Mod assist;HOB elevated (Comment degrees) (45 degrees) Supine to Sit Details (indicate cue type and reason): (A) to advance LE off bed and elevate trunk OOB and maintain balance. Sitting - Scoot to Edge of Bed: 2: Max assist Sitting - Scoot to  [place] of Bed Details (indicate cue type and reason): Assistance with weight shifts to advance hips to EOB.  Cues for sequence. Transfers Transfers: Yes Sit to Stand: 2: Max assist;With upper extremity assist;From bed Sit to Stand Details (indicate cue type and reason): (A) to initiate transfer and maintain balance.  Pt very unsteady and tends to lean posterior.  VCs and tactile cues to maintain TDWB on R LE Stand to Sit: 2: Max assist;To chair/3-in-1;With upper extremity assist Stand to Sit Details: (A) to slowly descend to recliner with cues for hand and LE placement. Stand Pivot Transfers: 2: Max Actuary Details (indicate cue type and reason): Assistance to maintain balance with transfer and maintain TDWB with R LE.  Pt unable to take a step and needs manual cues to advance hips and LE to recliner. Ambulation/Gait Ambulation/Gait: No Stairs: No Wheelchair Mobility Wheelchair Mobility: No  Posture/Postural Control Posture/Postural Control: Postural limitations Postural Limitations: Trunk/core weakness in sitting causing thrunk to flex and pelvis to posteriorly tilt. Balance Balance Assessed: Yes Static Sitting Balance Static Sitting - Balance Support: Bilateral upper extremity supported;Feet supported Static Sitting - Level of Assistance: 3: Mod assist Static Sitting - Comment/# of Minutes: ~8 minutes to prepare for transfer; pt continues to lean posterior Exercise  Total Joint Exercises Quad Sets: AAROM;Strengthening;Right;10 reps;Seated Heel Slides: AAROM;Right;10 reps;Supine Straight Leg Raises: AAROM;Right;10 reps;Supine Long Arc Quad: AAROM;Right;10 reps;Seated End of Session PT - End of Session Equipment Utilized During Treatment: Gait belt Activity Tolerance: Patient tolerated treatment well Patient left: in chair;with call bell in reach Nurse Communication: Mobility status for transfers General Behavior During Session: [facility] for tasks  performed Cognition: Impaired Cognitive Impairment: Pt with selective attention  but able to follow one step commands.  [name] 01/27/2011, 11:49 AM [phone]

## 2011-01-27 NOTE — Progress Notes (Signed)
SLP Cancellation Note  Unable to complete SLP evaluation/treatment secondary to patient noted to be on vent per chart review. Plan to f/u 01/29/11.    Therapist:  Ferdinand Lango MA, CCC-SLP [phone]

## 2011-01-27 NOTE — Progress Notes (Signed)
Speech Language/Pathology   Received call from RN - pt off vent, had pulled Panda and RN inquiring whether POs were an option or if Panda should be reinserted.    O/A:  FEES exam completed 12/27: pt with severe dysphagia and aspiration of secretions.  12/28  had episode of respiratory distress; underwent bronchoscopy: large amounts of clots were removed from the trachea and right mainstem bronchus.  VDRF until today; back on TC.  Pt not able to adequately tolerate cuff deflation today to resume Passy-Muir valve trials - upon deflation, pt conveyed discomfort, coughed extensively, and expelled copious, bloody secretions from trach.  SpO2 decreased to mid-80s.  RN performed tracheal suction and cuff was re-inflated.  SpO2 returned to high-90s.    Pt not ready for PO trials nor Passy-Muir valve trials today.  Recommend/Plan:  1.  SLP to continue to follow for dysphagia management and PMV use as pt is able 2.  Rec continue Panda feeds and NPO status.  Chad Tiznado L. Samson Frederic, [place] CCC/SLP Pager [phone]

## 2011-01-27 NOTE — Progress Notes (Signed)
Name: [name] MRN: [id number] DOB: [birth year]    LOS: 26  PCCM PROGRESS NOTE  History of Present Illness: 65 y/o F with PMHx of HTN suffered fall on 12/24/10 and LE pain. Initially taken to [facility] and diagnosed with R femoral DVT and started on anticoagulation. Pt had persistent pain and she had further imaging studies which showed R femoral fracture. Pt was transferred to [facility] for further management and was taken to the OR on 01/03/11 and underwent Open reduction and internal fixation of the right femur fracture. Post op suffered episode of Torsades and underwent 7-10 mins CPR. Subsequently developed AFRVR and hypotension. EKG demonstrated ST elevation on the inferior leads. Underwent cardiac cath 12/7. PCCM asked to assist with vent/CCM issues.  Extubated 01/10/11 and transferred out of 2300 12/18.  However on 12/20 developed acute onset shortness of breath, likely due to aspiration, re-intubated.  Lines / Drains: 12/07 L IJ CVL>>> 12/14 12/07 ETT>>> 12/10, 12/11 >>> 12/14 , 12/21 >> 12/21 12/21 perc trach (DF/KZ) >> 12/14 PICC >>>  12/15 Foley>>>  Cultures: 12/10 UC>>>neg 12/10 RC>>>neg 12/21 Sputum>>>NTD. 12/21 blood>>>NTD.  Antibiotics: 12/10 Vancomycin >>> 12/11  12/10 Ceftazidime (fever, purulent sputum, cloudy urine) >>> 12/18 12/21 vanc (HCAP vs. Asp PNA) >>12/29 12/21 Zosyn (HCAP vs. Asp PNA) >> 12/21 Cipro (HCAP vs. 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 Severe left ventricular dysfunction with inferior apical and distal anterior hypokinesis of severe degree. The estimated ejection fraction is 25%.  12/08 CT head: No acute abnormalities  12/08 Echo: Systolic function was moderately to severely reduced. The  estimated ejection fraction was in the range of 30% to 35%. There is akinesis of the apical myocardium. There is akinesis of the inferoposterior myocardium. There is hypokinesis of the lateral myocardium  12/11 Reintubated for sudden onset resp distress  12/11 CTA chest: No evidence of pulmonary embolus. Moderate bilateral effusions with significant consolidation and atelectasis in both lungs. Perihilar infiltration suggesting edema. Indeterminate mass or fluid collection in the right supraclavicular region measuring about 3 x 2.2 cm.  12/14 Extubated 12/20 Acute resp distress, transfer to 3300 12/21 Intubated for aspiration pneumonia 12/28 Transferred from SDU to ICU for trach site bleeding  Overnight:   No new developments. Comfortable on TC. Calm, alert and appropriate  Vital Signs: Temp:  [98 F (36.7 C)-98.5 F (36.9 C)] 98.3 F (36.8 C) (12/31 1109) Pulse Rate:  [78-121] 121  (12/31 1436) Resp:  [16-34] 34  (12/31 1436) BP: (80-125)/(41-86) 91/78 mmHg (12/31 1223) SpO2:  [95 %-100 %] 97 % (12/31 1436) FiO2 (%):  [28 %] 28 % (12/31 1436)   Intake/Output Summary (Last 24 hours) at 01/27/11 1512 Last data filed at 01/27/11 1436  Gross per 24 hour  Intake    420 ml  Output      0 ml  Net    420 ml    Physical Examination: Gen: Chronically ill appearing, NAD. HEENT: NCAT, PERRL, Trach site clean PULM: Clear anteriorly CV: RRR, Nl S1/S2, -M/R/G. AB: BS+, soft, nontender, no hsm Ext: warm, no edema, no clubbing, no cyanosis; R PICC site dressing c/d/i Derm: no rash or skin breakdown Neuro: Was following commands but now sedated.  BMET    Component  Value Date/Time   NA 148* 01/27/2011 0430   K 3.5 01/27/2011 0430   CL 114* 01/27/2011 0430   CO2 28 01/27/2011 0430   GLUCOSE 145* 01/27/2011 0430   BUN 17 01/27/2011 0430   CREATININE 0.85 01/27/2011 0430   CALCIUM 9.2 01/27/2011 0430   GFRNONAA 69* 01/27/2011 0430   GFRAA 80* 01/27/2011 0430   CBC    Component  Value Date/Time   WBC 11.2* 01/27/2011 0430   RBC 4.08 01/27/2011 0430   HGB 10.9* 01/27/2011 0430   HCT 35.4* 01/27/2011 0430   PLT 223 01/27/2011 0430   MCV 86.8 01/27/2011 0430   MCH 26.7 01/27/2011 0430   MCHC 30.8 01/27/2011 0430   RDW 18.5* 01/27/2011 0430   LYMPHSABS 1.9 01/13/2011 0615   MONOABS 0.9 01/13/2011 0615   EOSABS 0.0 01/13/2011 0615   BASOSABS 0.0 01/13/2011 0615   Ventilator settings: Vent Mode:  [-]  FiO2 (%):  [28 %] 28 %  Labs and Imaging:  Reviewed.  Please refer to the Assessment and Plan section for relevant results.  Assessment and Plan:  Ischemic cardiomyopathy, MI, status post torsades cardiac arrest, atrial fibrillation with RVR, hypertension.  Status post bare metal stent placement. Cardiology following.   - Continue ASA, Plavix (d/c after 30 days per Cardiology). - Continue Metoprolol, Lisinopril. - Continue Crestor. - Continue Amiodarone (for 6-8 weeks per Cardiology).  Respiratory Failure: aspiration pneumonia vs. HCAP (WBC up, febrile), Patient had significant clots in the right mainstem that was removed bronchoscopically. - Maintain on TC. - Cont augmentin and complete toal of 10 d abx.  Hypokalemia  Lab 01/27/11 0430 01/26/11 0620 01/25/11 0500 01/24/11 0630 01/23/11 0409  K 3.5 2.8* 4.2 3.6 3.3*  - Change lasix to daily to maintain even at this point.  Right Lower Extremity DVT  - Heparin and coumadin on hold due to 12/28 episode. No further trach site bleeding. PTINR > 2.0 still. Cont warfarin per pharmacy.  Anemia, bleeding from trach site on plavix, heparin, ASA  Lab 01/27/11 0430 01/26/11 0620 01/25/11 0500 01/24/11 0630 01/23/11 0409  HCT 35.4* 35.4* 32.3* 33.6* 35.4*  - PRBC for Hgb < 7.0.  Malnutrition, dysphagia.  Swallowing assessment completed. Swallow eval scheduled  Encephalopathy / delirium, improved -Resolved  Billy Fischer, MD;  PCCM service; Mobile [phone]

## 2011-01-27 NOTE — Progress Notes (Signed)
CSW reviewed chart and note pt intubated at this time. CSW will continue to follow to facilitate d/c planning as pt progresses.  Baxter Flattery, MSW [phone]

## 2011-01-27 NOTE — Progress Notes (Addendum)
Patient Name: [name] Date of Encounter: 01/27/2011     Principal Problem:  *Femur fracture, right Active Problems:  HTN (hypertension)  Femoral DVT (deep venous thrombosis)  Hyponatremia  Vitamin B12 deficiency (dietary) anemia  Osteoporosis  Cardiogenic shock  MI (myocardial infarction)  Respiratory failure  Aspiration pneumonia  Ischemic cardiomyopathy    SUBJECTIVE: On vent. Pt alert and responsive. Denies cp, SOB, diaphoresis, lightheadedness, cough, increased LE swelling.    OBJECTIVE  Filed Vitals:   01/27/11 0400 01/27/11 0500 01/27/11 0723 01/27/11 0725  BP: 102/59 109/72  108/66  Pulse: 86 80  89  Temp: 98 F (36.7 C)     TempSrc: Oral     Resp: 20 22  20   Height:      Weight:      SpO2: 99% 99% 100% 100%    Intake/Output Summary (Last 24 hours) at 01/27/11 1610 Last data filed at 01/27/11 0500  Gross per 24 hour  Intake    790 ml  Output      0 ml  Net    790 ml   Weight change:   PHYSICAL EXAM  General: Elderly, frail, NAD Head: Normocephalic, atraumatic, sclera non-icteric Neck: Supple without bruits or JVD. Lungs:  Resp regular and unlabored, CTAB, diffuse rhonchi noted - pt on vent Heart: RRR no s3, s4, or murmurs. PMI marked and palpable 2cm lateral of LLSB Abdomen: Soft, non-tender, non-distended, BS + x 4.  Msk:  Strength and tone appears normal for age. Extremities: Right leg in air cast. No clubbing, cyanosis or edema. DP/PT/Radials 2+ and equal bilaterally. Neuro: Pt unable to verablize 2/2 vent status. Alert and responsive. Moves all extremities spontaneously. Psych: Normal affect.  LABS:  Recent Labs  Basename 01/27/11 0430 01/26/11 0620   WBC 11.2* 9.7   HGB 10.9* 10.8*   HCT 35.4* 35.4*   MCV 86.8 86.6   PLT 223 195   No results found for this basename: VITAMINB12,FOLATE,FERRITIN,TIBC,IRON,RETICCTPCT in the last 72 hours No results found for this basename: DDIMER:2 in the last 72 hours  Lab 01/27/11 0430  01/26/11 0620 01/25/11 0500  NA 148* 145 146*  K 3.5 2.8* 4.2  CL 114* 109 113*  CO2 28 29 26   BUN 17 17 15   CREATININE 0.85 0.93 0.96  CALCIUM 9.2 8.9 8.6  PROT -- -- --  BILITOT -- -- --  ALKPHOS -- -- --  ALT -- -- --  AST -- -- --  AMYLASE -- -- --  LIPASE -- -- --  GLUCOSE 145* 152* 101*   No results found for this basename: HGBA1C in the last 72 hours No results found for this basename: CKTOTAL:3,CKMB:3,CKMBINDEX:3,TROPONINI:3 in the last 72 hours No components found with this basename: POCBNP No results found for this basename: CHOL,HDL,LDLCALC,TRIG,CHOLHDL,LDLDIRECT in the last 72 hours No results found for this basename: TSH,T4TOTAL,FREET3,T3FREE,THYROIDAB in the last 72 hours   TELE: NSR, 70-80 bpm, unchanged TWIs  ECG: None    Radiology/Studies:  Ct Angio Chest W/cm &/or Wo Cm  01/08/2011  *RADIOLOGY REPORT*  Clinical Data:  Chest pain  CT ANGIOGRAPHY CHEST WITH CONTRAST  Technique:  Multidetector CT imaging of the chest was performed using the standard protocol during bolus administration of intravenous contrast.  Multiplanar CT image reconstructions including MIPs were obtained to evaluate the vascular anatomy.  Contrast: 70mL OMNIPAQUE IOHEXOL 300 MG/ML IV SOLN  Comparison:  None  Findings:  Technically adequate study with good opacification of the central and segmental pulmonary arteries.  No focal filling defects demonstrated.  No evidence of significant pulmonary embolus.  There are bilateral pleural effusions with bilateral pulmonary parenchymal consolidation and atelectasis.  No pneumothorax.  Mild aortic dilatation with ascending thoracic aorta ectasia to 3.3 cm diameter.  Coronary artery calcification.  Perihilar infiltration and likely representing edema.  Moderately prominent mediastinal lymph nodes likely to be reactive.  There is a suggestion of a mass or fluid collection in the right supraclavicular region measuring about the 3 x 2.2 cm.  This could represent  enlarged lymph node, hematoma, or cystic mass.  Consider ultrasound for further evaluation. Degenerative changes in the thoracic spine.  Motion artifact causes double shadows throughout the sternum.  Review of the MIP images confirms the above findings.  IMPRESSION: No evidence of significant pulmonary embolus.  Moderate bilateral pleural effusions with significant consolidation and atelectasis in both lungs.  Perihilar infiltration suggesting edema. Indeterminate mass or fluid collection in the right supraclavicular region measuring about 3 x 2.2 cm.  Consider ultrasound for further evaluation.  Original Report Authenticated By: Marlon Pel, M.D.   Dg Chest Port 1 View  01/25/2011  *RADIOLOGY REPORT*  Clinical Data: Respiratory failure  PORTABLE CHEST - 1 VIEW  Comparison: 01/23/2011 of multiple priors.  Findings: Stable position of tracheostomy tube and right PICC.  An enteric tube can be followed to the lower esophagus and continues below the edge of the image.  Lung volumes are stable.  Cardiomediastinal silhouette is stable, with probable cardiomegaly.  Veiling opacities bilaterally likely reflect bilateral pleural effusions.  Aeration at the right lung bases improved on today's examination compared to 01/23/2011.  No evidence of pneumothorax.  IMPRESSION:  1.  Improving aeration of the right lung compared to 01/23/2011. 2. Suspect a persistent posteriorly layering pleural effusions.  Original Report Authenticated By: Britta Mccreedy, M.D.   Dg Chest Port 1 View  01/23/2011  *RADIOLOGY REPORT*  Clinical Data: 65 year old female with respiratory distress, respiratory failure.  PORTABLE CHEST - 1 VIEW  Comparison: 01/22/2011 earlier.  Findings: Semi upright AP portable view 0700 hours.  Stable tracheostomy tube.  Enteric tube courses to the abdomen, tip not included.  Stable right PICC line.  Stable lung volumes.  Right greater than left pleural effusions are stable or mildly progressed since  01/18/2011.  Patchy perihilar pulmonary opacity also is mildly progressed over the same time. No significant change since yesterday.  No pneumothorax.  No pulmonary edema.  IMPRESSION: 1. Stable lines and tubes. 2. No significant change since yesterday.  Right greater than left pleural effusions and perihilar opacity.  Original Report Authenticated By: Harley Hallmark, M.D.   Dg Chest Port 1 View  01/22/2011  *RADIOLOGY REPORT*  Clinical Data: Respiratory failure  PORTABLE CHEST - 1 VIEW  Comparison: 01/20/2011  Findings: Tracheostomy remains in good position.  Right arm PICC tip in the right atrium, unchanged.  Increase in right lower lobe atelectasis. Right lower lobe airspace disease may represent pneumonia and is unchanged.  Right pleural effusion unchanged.  Left lower lobe atelectasis unchanged.  Negative for heart failure.  Feeding tube has been placed with  the tip not visualized.  IMPRESSION: Progression of right lower lobe atelectasis.  Possible right lower lobe pneumonia.  Original Report Authenticated By: Camelia Phenes, M.D.   Dg Chest Port 1 View  01/20/2011  *RADIOLOGY REPORT*  Clinical Data: Tubes and lines, evaluate for aspiration pneumonia  PORTABLE CHEST - 1 VIEW  Comparison: 01/18/2011; 01/17/2011; 01/16/2011  Findings: Grossly unchanged cardiac  silhouette and mediastinal contours given decreased lung volumes and patient rotation.  Stable position of support apparatus.  Interval increase and right-sided medial basilar heterogeneous / consolidative opacities.  Interval increase in small right-sided effusion with likely development of tiny left-sided pleural effusion. No definite pneumothorax. Grossly unchanged bones.  IMPRESSION: 1.  Interval development of right infrahilar heterogeneous / consolidative opacities, possibly atelectasis though underlying infection or aspiration is not excluded. 2.  Interval increase and small right-sided effusion and development of tiny left-sided effusion.   Original Report Authenticated By: Waynard Reeds, M.D.   Portable Chest Xray In Am  01/18/2011  *RADIOLOGY REPORT*  Clinical Data: Intubated  PORTABLE CHEST - 1 VIEW  Comparison:   the previous day's study  Findings: Tracheostomy and right arm PICC are stable in position. Patchy airspace disease in the right lower lobe appears slightly more conspicuous.  The left lung is clear.  No definite effusion. Heart size upper limits normal for technique.  Regional bones unremarkable.  IMPRESSION:  1.  Slight increase in right lower lobe airspace disease. 2. Support hardware stable in position.  Original Report Authenticated By: Osa Craver, M.D.   Dg Chest Port 1 View  01/17/2011  *RADIOLOGY REPORT*  Clinical Data: Tracheostomy tube placement  PORTABLE CHEST - 1 VIEW  Comparison: 01/17/2011 1:20 hours  Findings: Tracheostomy tube has been placed with its tip 4.3 cm from the carina.  Stable appearance of the lungs.  No pneumothorax. Stable right PICC.  IMPRESSION: Tracheostomy tube placement as described.  Original Report Authenticated By: Donavan Burnet, M.D.   Portable Chest Xray  01/17/2011  *RADIOLOGY REPORT*  Clinical Data: Endotracheal tube placement  PORTABLE CHEST - 1 VIEW  Comparison: 01/16/2011  Findings: Endotracheal tube terminates 4 cm above the carina.  Enteric tube courses below the diaphragm.  Right subclavian PICC terminates at the cavoatrial junction.  Mild right basilar opacity, improved.  Lungs otherwise clear.  No pleural effusion or pneumothorax.  Cardiomediastinal silhouette is within normal limits.  IMPRESSION: Endotracheal tube terminates 4 cm above the carina.  Additional support apparatus as above.  Mild right basilar opacity, improved.  Original Report Authenticated By: Charline Bills, M.D.   Dg Abd Portable 1v  01/24/2011  *RADIOLOGY REPORT*  Clinical Data: Feeding tube insertion.  ABDOMEN - 1 VIEW  Comparison: 01/20/2011.  Findings: A small bore feeding tube is in  place.  The tip is in the distal stomach or potentially within the proximal duodenum.  There is no evidence for obstruction or free air.  Lung bases are clear.  The heart size is normal.  IMPRESSION:  1.  The leading edge of the feeding tube is within the distal stomach or potentially the proximal duodenum. 2.  No acute abnormality.  Original Report Authenticated By: Jamesetta Orleans. MATTERN, M.D.   Dg Abd Portable 1v  01/20/2011  *RADIOLOGY REPORT*  Clinical Data: Feeding tube placement.  ABDOMEN - 1 VIEW  Comparison: 01/19/2011.  Findings: Feeding tube is doubled back upon itself in the stomach. The tip is near the cardia of the stomach.  Bowel gas pattern appears normal.  IMPRESSION: Feeding tube doubled back upon itself with the tip at the cardia of the stomach.  Original Report Authenticated By: Andreas Newport, M.D.   Dg Abd Portable 1v  01/19/2011  *RADIOLOGY REPORT*  Clinical Data: Patent feeding tube placement  ABDOMEN - 1 VIEW  Comparison: 01/18/2011 chest x-ray  Findings: The feeding tube projects over the right lower lobe within  the right lower lobe bronchus.  Basilar airspace disease noted.  Nonobstructive bowel gas pattern.  Degenerative changes of the spine.  IMPRESSION: Feeding tube within the right lower lobe bronchus.  Findings called to Shawna Orleans, RN on 2300 for the patient  Original Report Authenticated By: Judie Petit. Ruel Favors, M.D.     Current Medications:     . amiodarone  200 mg Oral Daily  . amoxicillin-clavulanate  600 mg of amoxicillin Oral BID  . antiseptic oral rinse  15 mL Mouth Rinse 6 X Daily  . carvedilol  6.25 mg Oral BID WC  . chlorhexidine  15 mL Mouth Rinse BID  . clopidogrel  75 mg Oral Q breakfast  . cyanocobalamin  1,000 mcg Subcutaneous Weekly  . furosemide  40 mg Oral BID  . levalbuterol  0.63 mg Nebulization TID  . lisinopril  2.5 mg Oral Daily  . pantoprazole sodium  40 mg Per Tube Daily  . potassium chloride  40 mEq Per Tube TID  . rosuvastatin  10 mg  Oral Daily  . sodium chloride  10 mL Intracatheter Q12H  . DISCONTD: cyanocobalamin  1,000 mcg Intramuscular Weekly  . DISCONTD: piperacillin-tazobactam (ZOSYN)  IV  3.375 g Intravenous Q8H    ASSESSMENT AND PLAN:  1. Ischemic cardiomyopathy- s/p BMS to RCA; LVEF 25%. Plavix x 1 month, no ASA 2/2 Coumadin therapy, on statin. Continue medical management (loop, ACEI, BB).  2. MI- as above; prior to intervention- pt with VT/VF/torsades, a fib with RVR; now s/p PTCA in NSR 3. Atrial fibrillation with RVR- NSR currently, continue Amiodarone 200mg  x 6-8 wks 4. HTN- BP improved today, resume fluids with close monitoring 5. R femur fracture- s/p ORIF 12/07; stable  6. Femoral DVT- per CCM, INR 2.35 today 7. Respiratory failure- per CCM, pt stable, alert and responsive today without increased respiratory effort 8. Aspiration pneumonia- per CCM; breathing unlabored, leukocytosis of 11.2 today  Signed, R. Hurman Horn, PA-C 01/27/2011, 7:42 AM As Above; patient denies chest pain; Continue present cardiac meds. We will follow from a distance [address]

## 2011-01-28 ENCOUNTER — Encounter (HOSPITAL_COMMUNITY): Payer: Self-pay | Admitting: Cardiology

## 2011-01-28 DIAGNOSIS — I472 Ventricular tachycardia, unspecified: Secondary | ICD-10-CM

## 2011-01-28 DIAGNOSIS — I2589 Other forms of chronic ischemic heart disease: Secondary | ICD-10-CM

## 2011-01-28 DIAGNOSIS — I4891 Unspecified atrial fibrillation: Secondary | ICD-10-CM

## 2011-01-28 HISTORY — DX: Ventricular tachycardia: I47.2

## 2011-01-28 HISTORY — DX: Ventricular tachycardia, unspecified: I47.20

## 2011-01-28 LAB — CBC
HCT: 35.1 % — ABNORMAL LOW (ref 36.0–46.0)
MCHC: 30.5 g/dL (ref 30.0–36.0)
MCV: 87.5 fL (ref 78.0–100.0)
RDW: 18.5 % — ABNORMAL HIGH (ref 11.5–15.5)

## 2011-01-28 LAB — BASIC METABOLIC PANEL
CO2: 28 mEq/L (ref 19–32)
Calcium: 9.2 mg/dL (ref 8.4–10.5)
Creatinine, Ser: 0.84 mg/dL (ref 0.50–1.10)
Glucose, Bld: 125 mg/dL — ABNORMAL HIGH (ref 70–99)

## 2011-01-28 MED ORDER — SPIRONOLACTONE 25 MG PO TABS: 25.0000 mg | ORAL_TABLET | Freq: Every day | ORAL | Status: DC

## 2011-01-28 MED ORDER — POTASSIUM CHLORIDE 20 MEQ/15ML (10%) PO LIQD
ORAL | Status: AC
  Administered 2011-01-28: 40 meq
  Filled 2011-01-28: qty 30

## 2011-01-28 MED ORDER — WARFARIN SODIUM 5 MG PO TABS
5.0000 mg | ORAL_TABLET | Freq: Once | ORAL | Status: AC
  Administered 2011-01-28: 5 mg via ORAL
  Filled 2011-01-28: qty 1

## 2011-01-28 MED ORDER — SPIRONOLACTONE 25 MG PO TABS
25.0000 mg | ORAL_TABLET | Freq: Two times a day (BID) | ORAL | Status: DC
  Administered 2011-01-28 – 2011-01-29 (×2): 25 mg via ORAL
  Filled 2011-01-28 (×4): qty 1

## 2011-01-28 MED ORDER — POTASSIUM CHLORIDE 20 MEQ/15ML (10%) PO LIQD
40.0000 meq | Freq: Three times a day (TID) | ORAL | Status: AC
  Administered 2011-01-28 – 2011-01-29 (×3): 40 meq
  Filled 2011-01-28 (×3): qty 30

## 2011-01-28 MED ORDER — LISINOPRIL 2.5 MG PO TABS
2.5000 mg | ORAL_TABLET | Freq: Two times a day (BID) | ORAL | Status: DC
  Administered 2011-01-28 – 2011-01-29 (×2): 2.5 mg via ORAL
  Filled 2011-01-28 (×4): qty 1

## 2011-01-28 MED ORDER — CARVEDILOL 6.25 MG PO TABS
9.3750 mg | ORAL_TABLET | Freq: Two times a day (BID) | ORAL | Status: DC
  Administered 2011-01-29 – 2011-02-04 (×8): 9.375 mg via ORAL
  Filled 2011-01-28 (×20): qty 1

## 2011-01-28 NOTE — Progress Notes (Signed)
Name: [name] MRN: [id number] DOB: 1945/04/06    LOS: 27  PCCM PROGRESS NOTE  History of Present Illness: 66 y/o F with PMHx of HTN suffered fall on 12/24/10 and LE pain. Initially taken to [facility] and diagnosed with R femoral DVT and started on anticoagulation. Pt had persistent pain and she had further imaging studies which showed R femoral fracture. Pt was transferred to [facility] for further management and was taken to the OR on 01/03/11 and underwent Open reduction and internal fixation of the right femur fracture. Post op suffered episode of Torsades and underwent 7-10 mins CPR. Subsequently developed AFRVR and hypotension. EKG demonstrated ST elevation on the inferior leads. Underwent cardiac cath 12/7. PCCM asked to assist with vent/CCM issues.  Extubated 01/10/11 and transferred out of 2300 12/18.  However on 12/20 developed acute onset shortness of breath, likely due to aspiration, re-intubated.  Lines / Drains: 12/07 L IJ CVL>>> 12/14 12/07 ETT>>> 12/10, 12/11 >>> 12/14 , 12/21 >> 12/21 12/21 perc trach (DF/KZ) >> 12/14 PICC >>> 01/28/11 12/15 Foley>>> out  Cultures: 12/10 UC>>>neg 12/10 RC>>>neg 12/21 Sputum>>>NTD. 12/21 blood>>>NTD.  Antibiotics: 12/10 Vancomycin >>> 12/11  12/10 Ceftazidime (fever, purulent sputum, cloudy urine) >>> 12/18 12/21 vanc (HCAP vs. Asp PNA) >>12/29 12/21 Zosyn (HCAP vs. Asp PNA) >> 1/1 12/21 Cipro (HCAP vs. 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 Severe left ventricular dysfunction with inferior apical and distal anterior hypokinesis of severe degree. The estimated ejection fraction is 25%.  12/08 CT head: No acute abnormalities  12/08 Echo: Systolic function was moderately to severely  reduced. The estimated ejection fraction was in the range of 30% to 35%. There is akinesis of the apical myocardium. There is akinesis of the inferoposterior myocardium. There is hypokinesis of the lateral myocardium  12/11 Reintubated for sudden onset resp distress  12/11 CTA chest: No evidence of pulmonary embolus. Moderate bilateral effusions with significant consolidation and atelectasis in both lungs. Perihilar infiltration suggesting edema. Indeterminate mass or fluid collection in the right supraclavicular region measuring about 3 x 2.2 cm.  12/14 Extubated 12/20 Acute resp distress, transfer to 3300 12/21 Intubated for aspiration pneumonia 12/28 Transferred from SDU to ICU for trach site bleeding 12/31 Transferred to SDU. Failed FEES 1/1      Warfarin resumed  Overnight:   No new developments. Comfortable on TC. Calm, alert and appropriate  Vital Signs: Temp:  [97.5 F (36.4 C)-99.2 F (37.3 C)] 98.8 F (37.1 C) (01/01 1538) Pulse Rate:  [73-100] 76  (01/01 1538) Resp:  [16-29] 16  (01/01 1538) BP: (99-122)/(62-88) 103/67 mmHg (01/01 1538) SpO2:  [97 %-100 %] 99 % (01/01 1538) FiO2 (%):  [28 %] 28 % (01/01 1507) Weight:  [38 kg (83 lb 12.4 oz)] 83 lb 12.4 oz (38 kg) (01/01 0500)   Intake/Output Summary (Last 24 hours) at 01/28/11 1610 Last data filed at 01/28/11 1500  Gross per 24 hour  Intake   1175 ml  Output      0 ml  Net   1175 ml    Physical Examination: Gen: Chronically ill appearing, NAD. HEENT: NCAT, PERRL, Trach site clean PULM: Clear anteriorly CV: RRR, Nl S1/S2, -M/R/G. AB: BS+, soft, nontender, no hsm Ext: warm,  no edema, no clubbing, no cyanosis; R PICC site dressing c/d/i Derm: no rash or skin breakdown Neuro: Was following commands but now sedated.  BMET    Component Value Date/Time   NA 149* 01/28/2011 0500   K 3.2* 01/28/2011 0500   CL 113* 01/28/2011 0500   CO2 28 01/28/2011 0500   GLUCOSE 125* 01/28/2011 0500   BUN 21 01/28/2011 0500   CREATININE  0.84 01/28/2011 0500   CALCIUM 9.2 01/28/2011 0500   GFRNONAA 70* 01/28/2011 0500   GFRAA 82* 01/28/2011 0500   CBC    Component Value Date/Time   WBC 10.5 01/28/2011 0500   RBC 4.01 01/28/2011 0500   HGB 10.7* 01/28/2011 0500   HCT 35.1* 01/28/2011 0500   PLT 236 01/28/2011 0500   MCV 87.5 01/28/2011 0500   MCH 26.7 01/28/2011 0500   MCHC 30.5 01/28/2011 0500   RDW 18.5* 01/28/2011 0500   LYMPHSABS 1.9 01/13/2011 0615   MONOABS 0.9 01/13/2011 0615   EOSABS 0.0 01/13/2011 0615   BASOSABS 0.0 01/13/2011 0615   Ventilator settings: Vent Mode:  [-]  FiO2 (%):  [28 %] 28 %  Labs and Imaging:  Reviewed.  Please refer to the Assessment and Plan section for relevant results.  Assessment and Plan:  Ischemic cardiomyopathy, MI, status post torsades cardiac arrest, atrial fibrillation with RVR, hypertension.  Status post bare metal stent placement. Cardiology following.   - Continue ASA, Plavix (d/c after 30 days per Cardiology). - Continue Metoprolol, Lisinopril. - Continue Crestor. - Continue Amiodarone (for 6-8 weeks per Cardiology).  Respiratory Failure: aspiration pneumonia vs. HCAP (WBC up, febrile), Patient had significant clots in the right mainstem that was removed bronchoscopically. - Maintain on TC.  Hypokalemia  Lab 01/28/11 0500 01/27/11 0430 01/26/11 0620 01/25/11 0500 01/24/11 0630  K 3.2* 3.5 2.8* 4.2 3.6  - Increase spironolactone to BID  Right Lower Extremity DVT  - Heparin and coumadin held due to trach site bleeding 12/28 episode. No further bleeding. PTINR < 2.0. Resume warfarin 1/1  Anemia, bleeding from trach site on plavix, heparin, ASA  Lab 01/28/11 0500 01/27/11 0430 01/26/11 0620 01/25/11 0500 01/24/11 0630  HCT 35.1* 35.4* 35.4* 32.3* 33.6*  - PRBC for Hgb < 7.0.  Malnutrition, dysphagia.  Swallowing assessment completed. Severe dysphagia on FEES 12/31. Panda tube replaced and back on TFs @ goal  Encephalopathy / delirium -Resolved  Billy Fischer, MD;  PCCM  service; Mobile [phone]

## 2011-01-28 NOTE — Progress Notes (Signed)
  ANTICOAGULATION CONSULT NOTE - Initial Consult  Pharmacy Consult for Warfarin Indication: R femoral DVT  No Known Allergies  Patient Measurements: Height: 5' (152.4 cm) Weight: 83 lb 12.4 oz (38 kg) IBW/kg (Calculated) : 45.5  Adjusted Body Weight:   Vital Signs: Temp: 98.2 F (36.8 C) (01/01 0820) Temp src: Oral (01/01 0820) BP: 110/69 mmHg (01/01 1015) Pulse Rate: 79  (01/01 1015)  Labs:  Basename 01/28/11 0500 01/27/11 0430 01/26/11 0620  HGB 10.7* 10.9* --  HCT 35.1* 35.4* 35.4*  PLT 236 223 195  APTT -- -- --  LABPROT 22.7* 26.1* 30.0*  INR 1.96* 2.35* 2.81*  HEPARINUNFRC -- -- --  CREATININE 0.84 0.85 0.93  CKTOTAL -- -- --  CKMB -- -- --  TROPONINI -- -- --   Estimated Creatinine Clearance: 39 ml/min (by C-G formula based on Cr of 0.84).  Medical History: Past Medical History  Diagnosis Date  . Hypertension   . 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 Vitamin B12 deficiency (dietary) anemia 12/27/2010  . Arthritis   . MI (myocardial infarction) 01/09/2011  . Aspiration pneumonia 01/17/2011  . VT (ventricular tachycardia) 01/28/2011    Assessment:  RLE DVT (11/2010) previously on Coumadin which was held for several days d/t trach site bleeding.  Last dose 12/27. INR now 1.96.  No bleeding noted per MD now. Pt also on amiodarone which can elevate INR.     Goal of Therapy:  INR 2.0-3.0   Plan: Coumadin 5mg  po x 1 tonight.  F/u AM INR.   Veverly Larimer E 01/28/2011,11:52 AM

## 2011-01-28 NOTE — Progress Notes (Signed)
SUBJECTIVE:  Patient is doing well. She reports no chest pain or shortness of breath. Tracheostomy is in place. Nurse reports that she still needs frequent suctioning. Patient failed swallowing study yesterday. Continue with tube feeding. No orthopnea PND. No chest pain or shortness of breath. Patient feels weak but otherwise fairly comfortable.  Principal Problem:  *Femur fracture, right Active Problems:  HTN (hypertension)  Femoral DVT (deep venous thrombosis)  Hyponatremia  Vitamin B12 deficiency (dietary) anemia  Osteoporosis  Cardiogenic shock  MI (myocardial infarction)  Respiratory failure  Aspiration pneumonia  Ischemic cardiomyopathy  Current Facility-Administered Medications  Medication Dose Route Frequency Provider Last Rate Last Dose  . 0.9 %  sodium chloride infusion  250 mL Intravenous PRN Juan Rojas   20 mL at 01/25/11 1200  . acetaminophen (TYLENOL) tablet 650 mg  650 mg Oral Q6H PRN Srikar A Reddy   650 mg at 01/09/11 0024  . amiodarone (PACERONE) tablet 200 mg  200 mg Oral Daily Lewayne Bunting, MD   200 mg at 01/27/11 1443  . amoxicillin-clavulanate (AUGMENTIN) 600-42.9 MG/5ML suspension 600 mg  600 mg of amoxicillin Oral BID Wesam Yacoub   600 mg at 01/27/11 2316  . antiseptic oral rinse (BIOTENE) solution 15 mL  15 mL Mouth Rinse 6 X Daily [name], MD   15 mL at 01/28/11 0514  . bisacodyl (DULCOLAX) suppository 10 mg  10 mg Rectal Daily PRN Mearl Latin, PA      . carvedilol (COREG) tablet 6.25 mg  6.25 mg Oral BID WC Pricilla Riffle, MD   6.25 mg at 01/28/11 0816  . chlorhexidine (PERIDEX) 0.12 % solution 15 mL  15 mL Mouth Rinse BID [name], MD   15 mL at 01/28/11 0815  . clopidogrel (PLAVIX) tablet 75 mg  75 mg Oral Q breakfast Corky Crafts, MD   75 mg at 01/28/11 0815  . cyanocobalamin ((VITAMIN B-12)) injection 1,000 mcg  1,000 mcg Subcutaneous Weekly Dennie Fetters, [facility]      . feeding supplement (JEVITY 1.2) liquid 1,000 mL  1,000  mL Per Tube Continuous Leslye Peer, MD 25 mL/hr at 01/27/11 1430 1,000 mL at 01/27/11 1430  . fentaNYL (SUBLIMAZE) injection 25 mcg  25 mcg Intravenous Q3H PRN Cidney Hulett   25 mcg at 01/25/11 0059  . free water 250 mL  250 mL Per Tube Q6H Billy Fischer, MD   250 mL at 01/28/11 0514  . furosemide (LASIX) 8 MG/ML solution 40 mg  40 mg Per Tube Daily Billy Fischer, MD   40 mg at 01/28/11 1017  . levalbuterol (XOPENEX) nebulizer solution 0.63 mg  0.63 mg Nebulization TID Belkys Regalado, MD   0.63 mg at 01/28/11 0811   And  . levalbuterol (XOPENEX) nebulizer solution 0.63 mg  0.63 mg Nebulization Q3H PRN Hartley Barefoot, MD   0.63 mg at 01/16/11 1834  . lisinopril (PRINIVIL,ZESTRIL) tablet 2.5 mg  2.5 mg Oral Daily Lewayne Bunting, MD   2.5 mg at 01/27/11 1447  . magnesium hydroxide (MILK OF MAGNESIA) suspension 30 mL  30 mL Oral Daily PRN Mearl Latin, PA      . ondansetron (ZOFRAN) 8 mg/NS 50 ml IVPB  8 mg Intravenous Q8H PRN [name], MD       Or  . ondansetron [facility]) injection 4 mg  4 mg Intravenous Q8H PRN [name], MD   4 mg at 01/26/11 0015  . pantoprazole sodium (PROTONIX) 40 mg/20 mL oral suspension  40 mg  40 mg Per Tube Daily [facility], PHARMD   40 mg at 01/27/11 1443  . potassium chloride 20 MEQ/15ML (10%) liquid 40 mEq  40 mEq Per Tube TID Billy Fischer, MD   40 mEq at 01/28/11 1018  . rosuvastatin (CRESTOR) tablet 10 mg  10 mg Oral Daily Donato Schultz, MD   10 mg at 01/27/11 1444  . sodium chloride 0.9 % injection 10 mL  10 mL Intracatheter Q12H [name], MD   10 mL at 01/27/11 2200  . sodium chloride 0.9 % injection 10 mL  10 mL Intracatheter PRN [name], MD   10 mL at 01/23/11 2246  . DISCONTD: furosemide (LASIX) 8 MG/ML solution 40 mg  40 mg Oral BID Wesam Yacoub   40 mg at 01/27/11 1441     LABS: Basic Metabolic Panel:  Basename 01/28/11 0500 01/27/11 0430 01/26/11 0620  NA 149* 148* --  K 3.2* 3.5 --  CL 113* 114* --  CO2  28 28 --  GLUCOSE 125* 145* --  BUN 21 17 --  CREATININE 0.84 0.85 --  CALCIUM 9.2 9.2 --  MG -- 2.5 2.5  PHOS -- 2.3 3.6   CBC:  Basename 01/28/11 0500 01/27/11 0430  WBC 10.5 11.2*  NEUTROABS -- --  HGB 10.7* 10.9*  HCT 35.1* 35.4*  MCV 87.5 86.8  PLT 236 223    PHYSICAL EXAM Filed Vitals:   01/28/11 0500 01/28/11 0812 01/28/11 0813 01/28/11 0820  BP:    111/78  Pulse:   86 85  Temp:    98.2 F (36.8 C)  TempSrc:    Oral  Resp:   26 24  Height:      Weight: 83 lb 12.4 oz (38 kg)     SpO2:  100% 100% 100%   BP 111/78  Pulse 85  Temp(Src) 98.2 F (36.8 C) (Oral)  Resp 24  Ht 5' (1.524 m)  Wt 83 lb 12.4 oz (38 kg)  BMI 16.36 kg/m2  SpO2 100% intake and output +1025 MLS   Physical examination:  Patient appears comfortable. Tracheostomy in place. Secretions noted. Feeding tube in place.  HEENT: Pupils isocoric. Conjunctiva clear Neck: Normal carotid upstroke no carotid bruits Lungs: Scattered rhonchi bilaterally no wheezes Heart: Regular rate and rhythm normal S1-S2 no definite pathological murmurs. Abdomen: Soft nontender no rebound or guarding good bowel sounds Extremity exam: Status post ORIF, dropfoot right ankle Neuro: Alert and oriented grossly nonfocal  TELEMETRY: Reviewed telemetry normal sinus rhythm   Echocardiogram December 2012   - Left ventricle: The cavity size was normal. Wall thickness was increased in a pattern of mild LVH. There was moderate focal basal hypertrophy of the septum. Systolic function was moderately to severely reduced. The estimated ejection fraction was in the range of 30% to 35%. There is akinesis of the apical myocardium. There is akinesis of the inferoposterior myocardium. There is hypokinesis of the lateral myocardium. Doppler parameters are consistent with abnormal left ventricular relaxation (grade 1 diastolic dysfunction). - Aortic valve: Mild regurgitation. - Mitral valve: Mild regurgitation. - Right  ventricle: Systolic function was moderately reduced. - Pulmonary arteries: PA peak pressure: 32mm Hg (S).     ASSESSMENT AND PLAN:  Diagnoses   VT (ventricular tachycardia) secondary to torsades    Atrial fibrillation: Currently normal sinus rhythm on amiodarone    Ischemic cardiomyopathy status post ventricular tachycardia/ventricular fibrillation, subsequent atrial fibrillation with RVR on amiodarone therapy.    Respiratory failure status post  intubation.    Aspiration pneumonia: Failed swallow evaluation    MI (myocardial infarction) status post bare-metal stent to the RCA ejection fraction 25-30%. On Plavix no aspirin secondary to Coumadin.    Cardiogenic shock   Osteoporosis   Femur fracture, right status post ORIF 12 2007    Lupus anticoagulant positive   Vitamin B12 deficiency (dietary) anemia   Hyponatremia   Femoral DVT (deep venous thrombosis) on anticoagulation   Anemia   HTN (hypertension)  Hypokalemia    Plan:  CCM is already written orders to replace potassium. However patient is a candidate for spironolactone. We'll start this tomorrow and discontinue potassium orders.  Further increase lisinopril to 2.5 mg by mouth twice a day. If she tolerates this tomorrow coronary can be further increased to 9.375 mg by mouth twice a day.  Follow daily electrolytes  Volume status appears to be euvolemic. No further diuretic therapy at present.  Physical therapy and further evaluation for aspiration.   Alvin Critchley [facility] 01/28/2011, 10:12 AM

## 2011-01-28 NOTE — Progress Notes (Signed)
Called to start peripheral iv, and remove picc line; picc was placed 01-10-11; site wnl;  Pt with poor peripheral access; is no longer on meds, per RN;  Able to start piv, in right hand, near pt's knuckles; no other site noted for access; RN aware; picc removed;

## 2011-01-29 LAB — CBC
HCT: 35.8 % — ABNORMAL LOW (ref 36.0–46.0)
MCH: 26.8 pg (ref 26.0–34.0)
MCV: 88 fL (ref 78.0–100.0)
Platelets: 259 10*3/uL (ref 150–400)
RBC: 4.07 MIL/uL (ref 3.87–5.11)
RDW: 18.5 % — ABNORMAL HIGH (ref 11.5–15.5)

## 2011-01-29 MED ORDER — POTASSIUM CHLORIDE 20 MEQ/15ML (10%) PO LIQD
ORAL | Status: AC
  Administered 2011-01-29: 40 meq
  Filled 2011-01-29: qty 30

## 2011-01-29 MED ORDER — ROSUVASTATIN CALCIUM 10 MG PO TABS
10.0000 mg | ORAL_TABLET | Freq: Every day | ORAL | Status: DC
  Administered 2011-01-30 – 2011-02-03 (×5): 10 mg
  Filled 2011-01-29 (×6): qty 1

## 2011-01-29 MED ORDER — LISINOPRIL 2.5 MG PO TABS
2.5000 mg | ORAL_TABLET | Freq: Two times a day (BID) | ORAL | Status: DC
  Administered 2011-01-29 – 2011-02-03 (×8): 2.5 mg
  Filled 2011-01-29 (×5): qty 1

## 2011-01-29 MED ORDER — WARFARIN SODIUM 5 MG PO TABS
5.0000 mg | ORAL_TABLET | Freq: Once | ORAL | Status: AC
  Administered 2011-01-29: 5 mg via ORAL
  Filled 2011-01-29 (×2): qty 1

## 2011-01-29 MED ORDER — SODIUM CHLORIDE 0.9 % IJ SOLN
3.0000 mL | Freq: Two times a day (BID) | INTRAMUSCULAR | Status: DC
  Administered 2011-01-29 – 2011-02-02 (×9): 3 mL via INTRAVENOUS

## 2011-01-29 MED ORDER — PRO-STAT SUGAR FREE PO LIQD
30.0000 mL | Freq: Two times a day (BID) | ORAL | Status: DC
  Administered 2011-01-29 – 2011-02-03 (×10): 30 mL
  Filled 2011-01-29 (×14): qty 30

## 2011-01-29 MED ORDER — SPIRONOLACTONE 25 MG PO TABS
25.0000 mg | ORAL_TABLET | Freq: Two times a day (BID) | ORAL | Status: DC
  Administered 2011-01-29 – 2011-02-03 (×10): 25 mg
  Filled 2011-01-29 (×7): qty 1

## 2011-01-29 NOTE — Progress Notes (Signed)
Nutrition Follow-up  Pt was placed on vent briefly on 12/28. Awaiting PO trials when appropriate, per SLP note on 12/31.  Diet Order:  NPO; Jevity 1.2 at 25 ml/hr. Provides: 720 kcal, 33 grams protein, 484 ml free water. Current TF provides: 65% kcal needs and 51% protein needs.  Daily Nutrition Needs: Kcal: 1100 - 1200 kcal Protein: 65-75 g  Meds: Scheduled Meds:   . amiodarone  200 mg Oral Daily  . antiseptic oral rinse  15 mL Mouth Rinse 6 X Daily  . carvedilol  9.375 mg Oral BID WC  . chlorhexidine  15 mL Mouth Rinse BID  . clopidogrel  75 mg Oral Q breakfast  . cyanocobalamin  1,000 mcg Subcutaneous Weekly  . free water  250 mL Per Tube Q6H  . furosemide  40 mg Per Tube Daily  . levalbuterol  0.63 mg Nebulization TID  . lisinopril  2.5 mg Oral BID  . potassium chloride  40 mEq Per Tube TID  . potassium chloride      . rosuvastatin  10 mg Oral Daily  . sodium chloride  10 mL Intracatheter Q12H  . spironolactone  25 mg Oral BID  . warfarin  5 mg Oral ONCE-1800  . DISCONTD: amoxicillin-clavulanate  600 mg of amoxicillin Oral BID  . DISCONTD: carvedilol  6.25 mg Oral BID WC  . DISCONTD: lisinopril  2.5 mg Oral Daily  . DISCONTD: pantoprazole sodium  40 mg Per Tube Daily  . DISCONTD: spironolactone  25 mg Oral Daily   Continuous Infusions:   . feeding supplement (JEVITY 1.2) 1,000 mL (01/29/11 0630)   PRN Meds:.sodium chloride, acetaminophen, bisacodyl, fentaNYL, levalbuterol, magnesium hydroxide, ondansetron (ZOFRAN) IV, ondansetron (ZOFRAN) IV, sodium chloride  Labs:  CMP     Component Value Date/Time   NA 149* 01/28/2011 0500   K 3.2* 01/28/2011 0500   CL 113* 01/28/2011 0500   CO2 28 01/28/2011 0500   GLUCOSE 125* 01/28/2011 0500   BUN 21 01/28/2011 0500   CREATININE 0.84 01/28/2011 0500   CALCIUM 9.2 01/28/2011 0500   PROT 6.8 01/13/2011 0615   ALBUMIN 2.5* 01/13/2011 0615   AST 115* 01/13/2011 0615   ALT 297* 01/13/2011 0615   ALKPHOS 120* 01/13/2011 0615   BILITOT 1.2  01/13/2011 0615   GFRNONAA 70* 01/28/2011 0500   GFRAA 82* 01/28/2011 0500     Intake/Output Summary (Last 24 hours) at 01/29/11 0909 Last data filed at 01/29/11 0600  Gross per 24 hour  Intake   1475 ml  Output      0 ml  Net   1475 ml    Weight Status:  39.9 kg, wt trending down  Nutrition Dx:  Inadequate oral intake - persists.  Goal:  Meet >/= 90% estimated needs with EN. Unmet. Monitor: labs, weights, I/O's, EN rate and tolerance, PO diet initiation  Intervention:   1. If pt unable to transition to PO diet and remains on EN, recommend increasing Jevity 1.2 to goal rate of 40 ml/hr to provide: 1152 kcal, 53 g protein, 775 ml free water. 2. 30 ml Prostat liquid protein via tube BID to meet protein needs at current rate. 30 ml Prostat = 15 grams protein. Prostat BID and Jevity 1.2 at 25 ml/hr will provide: 864 kcal, 63 g protein. 3. RD to follow nutrition care plan.  Adair Laundry Pager #:  [phone]

## 2011-01-29 NOTE — Progress Notes (Signed)
01/29/11 pt was transferred to 2008 report given . RT /o2  [employer]

## 2011-01-29 NOTE — Progress Notes (Signed)
ANTICOAGULATION CONSULT NOTE - Follow Up Consult  Pharmacy Consult for: Coumadin Indication: R femoral DVT  No Known Allergies  Vital Signs: Temp: 98.7 F (37.1 C) (01/02 1213) Temp src: Oral (01/02 1213) BP: 103/63 mmHg (01/02 1213) Pulse Rate: 77  (01/02 1213)  Labs:  Basename 01/29/11 0537 01/28/11 0500 01/27/11 0430  HGB 10.9* 10.7* --  HCT 35.8* 35.1* 35.4*  PLT 259 236 223  APTT -- -- --  LABPROT 21.6* 22.7* 26.1*  INR 1.84* 1.96* 2.35*  HEPARINUNFRC -- -- --  CREATININE -- 0.84 0.85  CKTOTAL -- -- --  CKMB -- -- --  TROPONINI -- -- --   Estimated Creatinine Clearance: 40.9 ml/min (by C-G formula based on Cr of 0.84).   Medications:  Scheduled:    . amiodarone  200 mg Oral Daily  . antiseptic oral rinse  15 mL Mouth Rinse 6 X Daily  . carvedilol  9.375 mg Oral BID WC  . chlorhexidine  15 mL Mouth Rinse BID  . clopidogrel  75 mg Oral Q breakfast  . cyanocobalamin  1,000 mcg Subcutaneous Weekly  . feeding supplement  30 mL Per Tube BID  . free water  250 mL Per Tube Q6H  . furosemide  40 mg Per Tube Daily  . levalbuterol  0.63 mg Nebulization TID  . lisinopril  2.5 mg Per Tube BID  . potassium chloride  40 mEq Per Tube TID  . potassium chloride      . rosuvastatin  10 mg Per Tube Daily  . sodium chloride  10 mL Intracatheter Q12H  . spironolactone  25 mg Per Tube BID  . warfarin  5 mg Oral ONCE-1800  . DISCONTD: lisinopril  2.5 mg Oral BID  . DISCONTD: rosuvastatin  10 mg Oral Daily  . DISCONTD: spironolactone  25 mg Oral BID   Assessment: RLE DVT (11/2010) previously on Coumadin which was held for several days d/t trach site bleeding. Resumed 1/1. INR below goal at 1.84. No bleeding noted.  Goal of Therapy:  INR 2-3   Plan:  1) Repeat coumadin 5mg  x 1 2) Follow up INR in AM  Fredrik Rigger 01/29/2011,12:52 PM

## 2011-01-29 NOTE — Progress Notes (Signed)
Name: Chanell Nadeau MRN: [id number] DOB: [birth year]    LOS: 28  PCCM PROGRESS NOTE  History of Present Illness: 66 y/o F with PMHx of HTN suffered fall on 12/24/10 and LE pain. Initially taken to [facility] and diagnosed with R femoral DVT and started on anticoagulation. Pt had persistent pain and she had further imaging studies which showed R femoral fracture. Pt was transferred to [facility] for further management and was taken to the OR on 01/03/11 and underwent Open reduction and internal fixation of the right femur fracture. Post op suffered episode of Torsades and underwent 7-10 mins CPR. Subsequently developed AFRVR and hypotension. EKG demonstrated ST elevation on the inferior leads. Underwent cardiac cath 12/7. PCCM asked to assist with vent/CCM issues.  Extubated 01/10/11 and transferred out of 2300 12/18.  However on 12/20 developed acute onset shortness of breath, likely due to aspiration, re-intubated.  Lines / Drains: 12/07 L IJ CVL>>> 12/14 12/07 ETT>>> 12/10, 12/11 >>> 12/14 ,  12/21 ETT >> 12/21 12/14 PICC >>> 01/28/11 12/15 Foley>>> out 12/21 perc trach (DF/KZ) >>  Cultures: 12/10 UC>>>neg 12/10 RC>>>neg 12/21 Sputum>>>NTD. 12/21 blood>>>NTD.  Antibiotics: 12/10 Vancomycin >>> 12/11  12/10 Ceftazidime (fever, purulent sputum, cloudy urine) >>> 12/18 12/21 vanc (HCAP vs. Asp PNA) >>12/29 12/21 Zosyn (HCAP vs. Asp PNA) >> 1/1 12/21 Cipro (HCAP vs. 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 Severe left ventricular dysfunction with inferior apical and distal anterior hypokinesis of severe degree. The estimated ejection fraction is 25%.  12/08 CT head: No acute abnormalities  12/08 Echo: Systolic function was moderately to severely  reduced. The estimated ejection fraction was in the range of 30% to 35%. There is akinesis of the apical myocardium. There is akinesis of the inferoposterior myocardium. There is hypokinesis of the lateral myocardium  12/11 Reintubated for sudden onset resp distress  12/11 CTA chest: No evidence of pulmonary embolus. Moderate bilateral effusions with significant consolidation and atelectasis in both lungs. Perihilar infiltration suggesting edema. Indeterminate mass or fluid collection in the right supraclavicular region measuring about 3 x 2.2 cm.  12/14 Extubated 12/20 Acute resp distress, transfer to 3300 12/21 Intubated for aspiration pneumonia 12/28 Transferred from SDU to ICU for trach site bleeding 12/31 Transferred to SDU. Failed FEES 1/1      Warfarin resumed  Overnight:   No new developments. Comfortable on TC. Calm, alert and appropriate  Vital Signs: Temp:  [98.1 F (36.7 C)-99.2 F (37.3 C)] 98.3 F (36.8 C) (01/02 0751) Pulse Rate:  [76-84] 84  (01/02 0751) Resp:  [16-29] 21  (01/02 0400) BP: (99-106)/(58-71) 106/71 mmHg (01/02 0751) SpO2:  [97 %-100 %] 100 % (01/02 0801) FiO2 (%):  [28 %] 28 % (01/02 0801) Weight:  [39.9 kg (87 lb 15.4 oz)] 87 lb 15.4 oz (39.9 kg) (01/02 0500)   Intake/Output Summary (Last 24 hours) at 01/29/11 1111 Last data filed at 01/29/11 1049  Gross per 24 hour  Intake   1450 ml  Output      0 ml  Net   1450 ml    Physical Examination: Gen: Chronically ill appearing, NAD. HEENT: NCAT, PERRL, Trach site clean PULM: Clear anteriorly CV: RRR, Nl S1/S2, -M/R/G. AB: BS+, soft, nontender, no hsm  Ext: warm, no edema, no clubbing, no cyanosis; R PICC site dressing c/d/i Derm: no rash or skin breakdown Neuro: Was following commands but now sedated.  BMET    Component Value Date/Time   NA 149* 01/28/2011 0500   K 3.2* 01/28/2011 0500   CL 113* 01/28/2011 0500   CO2 28 01/28/2011 0500   GLUCOSE 125* 01/28/2011 0500   BUN 21 01/28/2011 0500    CREATININE 0.84 01/28/2011 0500   CALCIUM 9.2 01/28/2011 0500   GFRNONAA 70* 01/28/2011 0500   GFRAA 82* 01/28/2011 0500   CBC    Component Value Date/Time   WBC 8.6 01/29/2011 0537   RBC 4.07 01/29/2011 0537   HGB 10.9* 01/29/2011 0537   HCT 35.8* 01/29/2011 0537   PLT 259 01/29/2011 0537   MCV 88.0 01/29/2011 0537   MCH 26.8 01/29/2011 0537   MCHC 30.4 01/29/2011 0537   RDW 18.5* 01/29/2011 0537   LYMPHSABS 1.9 01/13/2011 0615   MONOABS 0.9 01/13/2011 0615   EOSABS 0.0 01/13/2011 0615   BASOSABS 0.0 01/13/2011 0615   Ventilator settings: Vent Mode:  [-]  FiO2 (%):  [28 %] 28 %  Labs and Imaging:  Reviewed.  Please refer to the Assessment and Plan section for relevant results.  Assessment and Plan:  Ischemic cardiomyopathy, MI, status post torsades cardiac arrest, atrial fibrillation with RVR, hypertension.  Status post bare metal stent placement. Cardiology following.   - Continue ASA, Plavix (d/c after 30 days per Cardiology). - Continue Metoprolol, Lisinopril. - Continue Crestor. - Continue Amiodarone (for 6-8 weeks per Cardiology).  Respiratory Failure: aspiration pneumonia vs. HCAP (WBC up, febrile), Patient had significant clots in the right mainstem that was removed bronchoscopically. - Maintain on TC. - Consider downsize to #4 Shiley in next couple of days. - Would like to work towards decannulation but all care providers should keep in mind that she suffered multiple episodes of poorly explained sudden onset respiratory events that required intubation  Hypokalemia  Lab 01/28/11 0500 01/27/11 0430 01/26/11 0620 01/25/11 0500 01/24/11 0630  K 3.2* 3.5 2.8* 4.2 3.6  - Increased spironolactone to BID 1/1 -Recheck BMET 1/3  Right Lower Extremity DVT  - Heparin and coumadin held due to trach site bleeding 12/28 episode. No further bleeding. PTINR < 2.0. Resumed warfarin 1/1  Anemia, bleeding from trach site on plavix, heparin, ASA  Lab 01/29/11 0537 01/28/11 0500 01/27/11 0430 01/26/11  0620 01/25/11 0500  HCT 35.8* 35.1* 35.4* 35.4* 32.3*  - PRBC for Hgb < 7.0.  Malnutrition, dysphagia.  Swallowing assessment completed. Severe dysphagia on FEES 12/31. Panda tube replaced and back on TFs @ goal  Encephalopathy / delirium -Resolved   Transfer to Tele bed   Billy Fischer, MD;  PCCM service; Mobile [phone]

## 2011-01-29 NOTE — Progress Notes (Signed)
Speech Pathology  PMSV (Passy-Muir Speaking Valve) Treatment  . Tracheostomy Tube  Trach collar period: 24 hours/day Type: shiley    Size: 6.0     FiO2: 28% Cuff: yes Fenestration:  no Secretion description: thick ,yellow Frequency of tracheal suctioning: unknown Level of secretion expectoration: tracheal  . Cuff Deflation Trials  yes for 5  minute  V. PMSV Trial PMSV was placed for 15 minutes. Able to redirect subglottic air through upper airway: yes Able to attain phonation with PMSV: yes Able to expectorate secretions with PMSV: no Breath Support for phonation: significantly decreased Intelligibiity: < 25% RR: low 20s SpO2: 94-96% HR:85-87 Behavior: alert, distractible, focused on "going home"  VI. Clinical Impression:  Patient seen for PMSV trials facilitated by SLP. Cuff deflated with strong cough response with expectoration of thick, yellow secretions via trach. PMSV placed and patient able to tolerate for 15 minutes with no evidence of increased WOB, CO@ retention, or significant worsening in HR, RR, or O2. Patient however, continues to present with only minimal ability to achieve audible phonation (x2, hoarse, breathy, out of many clinician facilitated attempts), despite intact direction of air through upper airway. Suspect that thick secretions combined with poor breath support is cause. PMSV removed following trials and cuff re-inflated. Will continue PMSV trials with SLP only and consider repeat objective swallow evaluation once patient able to better tolerate PMSV and achieve adequate phonation indicative of intact upper airway and improved breath support.    . Recommendations MD- Consider changing tracheostomy tube to a cuffless when secretions are being better managed.  1. Patient to use PMSV with SLP only.   Short Term Goals Progressing  Pain: no  Ferdinand Lango MA, CCC-SLP [phone]

## 2011-01-30 DIAGNOSIS — J9600 Acute respiratory failure, unspecified whether with hypoxia or hypercapnia: Secondary | ICD-10-CM

## 2011-01-30 DIAGNOSIS — R1310 Dysphagia, unspecified: Secondary | ICD-10-CM

## 2011-01-30 LAB — BASIC METABOLIC PANEL
CO2: 26 mEq/L (ref 19–32)
Chloride: 110 mEq/L (ref 96–112)
GFR calc Af Amer: >90 mL/min (ref >90)
Potassium: 3.4 mEq/L — ABNORMAL LOW (ref 3.5–5.1)

## 2011-01-30 LAB — PROTIME-INR
INR: 2.15 — ABNORMAL HIGH (ref 0.00–1.49)
Prothrombin Time: 24.4 seconds — ABNORMAL HIGH (ref 11.6–15.2)

## 2011-01-30 LAB — CBC
HCT: 34.5 % — ABNORMAL LOW (ref 36.0–46.0)
Hemoglobin: 10.5 g/dL — ABNORMAL LOW (ref 12.0–15.0)
RBC: 3.99 MIL/uL (ref 3.87–5.11)

## 2011-01-30 MED ORDER — WARFARIN SODIUM 3 MG PO TABS
3.0000 mg | ORAL_TABLET | Freq: Once | ORAL | Status: AC
  Administered 2011-01-30: 3 mg via ORAL
  Filled 2011-01-30: qty 1

## 2011-01-30 NOTE — Progress Notes (Signed)
UR Completed.  [name] Jane [phone] 01/30/2011  

## 2011-01-30 NOTE — Progress Notes (Signed)
Physical Therapy Treatment Patient Details Name: [name] MRN: [id number] DOB: 1945-06-24 Today's Date: 01/30/2011  PT Assessment/Plan  PT - Assessment/Plan Comments on Treatment Session: Patient progressing with transfers needing less assist today.  Continues on trach collar with sats at 100%.   PT Plan: Discharge plan remains appropriate PT Frequency: Min 2X/week Follow Up Recommendations: Skilled nursing facility;24 hour supervision/assistance Equipment Recommended: Defer to next venue PT Goals  Acute Rehab PT Goals PT Goal Formulation: With patient PT Goal: Supine/Side to Sit - Progress: Met PT Goal: Sit to Supine/Side - Progress: Other (comment) PT Goal: Sit to Stand - Progress: Progressing toward goal PT Goal: Stand to Sit - Progress: Progressing toward goal PT Transfer Goal: Bed to Chair/Chair to Bed - Progress: Progressing toward goal PT Goal: Ambulate - Progress: Progressing toward goal PT Goal: Perform Home Exercise Program - Progress: Progressing toward goal  PT Treatment Precautions/Restrictions  Precautions Precautions: Fall Required Braces or Orthoses: No Restrictions Weight Bearing Restrictions: Yes RLE Weight Bearing: Touchdown weight bearing Other Position/Activity Restrictions: Right LE ROM without limitations. Mobility (including Balance) Bed Mobility Supine to Sit: HOB flat;4: Min assist Supine to Sit Details (indicate cue type and reason): Moving better today and needing less cues Sitting - Scoot to Edge of Bed: 4: Min assist Sit to Supine - Right: Not Tested (comment) Transfers Sit to Stand: 1: +2 Total assist;Patient percentage (comment);With upper extremity assist;From bed (pt= 65%) Sit to Stand Details (indicate cue type and reason): A to initiate transfer and maintain balance.  Unsteady as she attempts to come up to RW and she does keep her weight posterior to her body.  Verbal and manual cues to maintain TDWB on R LE Stand to Sit: 1: +2 Total  assist;Patient percentage (comment);With upper extremity assist;To chair/3-in-1;With armrests (pt = 50%) Stand to Sit Details: Assist needed to control descent with max cues for hand placement Stand Pivot Transfers: 1: +2 Total assist;Patient percentage (comment) (pt = 50%) Stand Pivot Transfer Details (indicate cue type and reason): Assistance to maintain balance with transfer and to maintain TDWB RLE.  Pt. was able to pivot on LLE with RW with PT assisting with maintenance of TDWB on RLE.   Lateral/Scoot Transfers: Not tested (comment) Ambulation/Gait Ambulation/Gait: No Stairs: No Wheelchair Mobility Wheelchair Mobility: No  Posture/Postural Control Posture/Postural Control: Postural limitations Postural Limitations: Trunk/core weakness with postural instability Static Sitting Balance Static Sitting - Balance Support: Bilateral upper extremity supported;Feet supported Static Sitting - Level of Assistance: 4: Min assist (guard assist) Static Sitting - Comment/# of Minutes: 5 minutes with pt. sitting better but continues with tendency toward posterior lean Exercise  General Exercises - Lower Extremity Ankle Circles/Pumps: AROM;Both;10 reps;Supine Quad Sets: AROM;Both;10 reps;Supine Heel Slides: AROM;Both;10 reps;Supine Straight Leg Raises: AROM;10 reps;Both;Supine End of Session PT - End of Session Equipment Utilized During Treatment: Gait belt Activity Tolerance: Patient tolerated treatment well Patient left: in chair;with call bell in reach Nurse Communication: Mobility status for transfers General Behavior During Session: [facility] for tasks performed Cognition: Impaired Cognitive Impairment: Selective attention.  Follows one step commands  [name],[name] 01/30/2011, 12:40 PM [facility] Rehabilitation [phone] (pager)

## 2011-01-30 NOTE — Progress Notes (Signed)
Name: [name] MRN: [id number] DOB: [birth year]    LOS: 29  PCCM PROGRESS NOTE  History of Present Illness: 89 yobf  c HTN suffered fall on 12/24/10 and LE pain. Initially taken to [facility] and diagnosed with R femoral DVT and started on anticoagulation. Pt had persistent pain and she had further imaging studies which showed R femoral fracture. Pt was transferred to [facility] for further management and was taken to the OR on 12/07> ORIF  right femur fracture. Post op suffered episode of Torsades and underwent 7-10 mins CPR. Subsequently developed AFRVR and hypotension. EKG demonstrated ST elevation on the inferior leads. Underwent cardiac cath 12/7. PCCM asked to assist with vent/CCM issues.  Extubated 01/10/11 and transferred out of 2300 12/18.  However on 12/20 developed acute onset shortness of breath, likely due to aspiration, re-intubated/ then trached weaned and tx to floor 1/2  Lines / Drains: 12/07 L IJ CVL>>> 12/14 12/07 ETT>>> 12/10, 12/11 >>> 12/14 ,  12/21 ETT >> 12/21 12/14 PICC >>> 01/28/11 12/21 perc trach (DF/KZ) >>  Cultures: 12/10 UC>>>neg 12/10 RC>>>neg 12/20 MRSA  Screen > neg 12/21 Sputum> neg 12/21 blood> neg x 2 12/27 Sputum> few candida only  Antibiotics: 12/10 Vancomycin >>> 12/11  12/10 Ceftazidime (fever, purulent sputum, cloudy urine) >>> 12/18 12/21 vanc (HCAP vs. Asp PNA) >>12/29 12/21 Zosyn (HCAP vs. Asp PNA) >> 1/1 12/21 Cipro (HCAP vs. 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 Severe left ventricular dysfunction with inferior apical and distal anterior hypokinesis of severe degree. The estimated ejection fraction is 25%.  12/08 CT head: No acute abnormalities  12/08 Echo: Systolic function was moderately  to severely reduced. The estimated ejection fraction was in the range of 30% to 35%. There is akinesis of the apical myocardium. There is akinesis of the inferoposterior myocardium. There is hypokinesis of the lateral myocardium  12/11 Reintubated for sudden onset resp distress  12/11 CTA chest: No evidence of pulmonary embolus. Moderate bilateral effusions with significant consolidation and atelectasis in both lungs. Perihilar infiltration suggesting edema. Indeterminate mass or fluid collection in the right supraclavicular region measuring about 3 x 2.2 cm.  12/14 Extubated 12/20 Acute resp distress, transfer to 3300 12/21 Intubated for aspiration pneumonia 12/28 Transferred from SDU to ICU for trach site bleeding 12/31 Transferred to SDU. Failed FEES 1/1      Warfarin resumed  Overnight:   No new developments. Comfortable on TC.  And sats 99% on .28 fio2  Vital Signs: Temp:  [97.7 F (36.5 C)-98.2 F (36.8 C)] 97.7 F (36.5 C) (01/03 1433) Pulse Rate:  [63-77] 70  (01/03 1433) Resp:  [18-24] 19  (01/03 1433) BP: (97-99)/(59-65) 97/61 mmHg (01/03 1433) SpO2:  [98 %-100 %] 99 % (01/03 1617) FiO2 (%):  [28 %] 28 % (01/03 1617) Weight:  [90 lb 6.2 oz (41 kg)] 90 lb 6.2 oz (41 kg) (01/03 0519)   Intake/Output Summary (Last 24 hours) at 01/30/11 1919 Last data filed at 01/30/11 0730  Gross per 24 hour  Intake    250 ml  Output      2 ml  Net    248 ml    Physical Examination: Gen: Chronically ill appearing, NAD. HEENT: NCAT, PERRL, Trach  site clean PULM: Clear anteriorly CV: RRR, Nl S1/S2, -M/R/G. AB: BS+, soft, nontender, no hsm Ext: warm, no edema, no clubbing, no cyanosis; R PICC site dressing c/d/i Derm: no rash or skin breakdown Neuro: Was following commands but now sedated.   LABS  Lab 01/30/11 0522 01/28/11 0500 01/27/11 0430  NA 146* 149* 148*  K 3.4* 3.2* 3.5  CL 110 113* 114*  CO2 26 28 28   BUN 22 21 17   CREATININE 0.79 0.84 0.85  GLUCOSE 124* 125* 145*     Lab 01/30/11 0522 01/29/11 0537 01/28/11 0500  HGB 10.5* 10.9* 10.7*  HCT 34.5* 35.8* 35.1*  WBC 8.0 8.6 10.5  PLT 276 259 236     Assessment and Plan:  Ischemic cardiomyopathy, MI, status post torsades cardiac arrest, atrial fibrillation with RVR, hypertension.  Status post bare metal stent placement. Cardiology following.   - Continue ASA, Plavix (d/c after 30 days per Cardiology). - Continue Metoprolol, Lisinopril. - Continue Crestor. - Continue Amiodarone (for 6-8 weeks per Cardiology).  Respiratory Failure: aspiration pneumonia vs. HCAP (WBC up, febrile), Patient had significant clots in the right mainstem that was removed bronchoscopically. - Maintain on TC. - Consider downsize to #4 [place] in next couple of days. - Would like to work towards decannulation but all care providers should keep in mind that she suffered multiple episodes of poorly explained sudden onset respiratory events that required intubation   Hypokalemia  Lab 01/30/11 0522 01/28/11 0500 01/27/11 0430 01/26/11 0620 01/25/11 0500  K 3.4* 3.2* 3.5 2.8* 4.2  - Increased spironolactone to BID 1/1 -Recheck BMET 1/3> trending up on spirolactone so no rx  Right Lower Extremity DVT  - Heparin and coumadin held due to trach site bleeding 12/28 episode. No further bleeding. PTINR < 2.0. Resumed warfarin 1/1  Anemia, bleeding from trach site on plavix, heparin, ASA  Lab 01/30/11 0522 01/29/11 0537 01/28/11 0500 01/27/11 0430 01/26/11 0620  HCT 34.5* 35.8* 35.1* 35.4* 35.4*  - PRBC for Hgb < 7.0.  Malnutrition, dysphagia.  Swallowing assessment completed. Severe dysphagia on FEES 12/31. Panda tube replaced and back on TFs @ goal  Encephalopathy / delirium -Resolved    Sandrea Hughs, MD Pulmonary and Critical Care Medicine [facility] Cell [phone]

## 2011-01-30 NOTE — Progress Notes (Signed)
ANTICOAGULATION CONSULT NOTE - Follow Up Consult  Pharmacy Consult for Warfarin Indication: R femoral DVT  Assessment: 66 y.o. F on warfarin for RLE DVT (11/2010) resumed on 01/28/11 after being held for several days due to trach site bleeding. INR this a.m is therapeutic. Per discussion with nurse some bleeding noted around trach site yesterday likely due to increased suctioning--none noted overnight or this morning.   Goal of Therapy:  INR 2-3   Plan:  1. Warfarin 3 mg x 1 dose at 1800 today 2. Will continue to monitor for any signs/symptoms of bleeding and will follow up with PT/INR in the a.m.   Georgina Pillion Ann 01/30/2011,10:13 AM   No Known Allergies  Patient Measurements: Height: 5' (152.4 cm) Weight: 90 lb 6.2 oz (41 kg) IBW/kg (Calculated) : 45.5    Vital Signs: Temp: 98.1 F (36.7 C) (01/03 0519) Temp src: Oral (01/03 0519) BP: 98/59 mmHg (01/03 0519) Pulse Rate: 77  (01/03 0733)  Labs:  Basename 01/30/11 0522 01/29/11 0537 01/28/11 0500  HGB 10.5* 10.9* --  HCT 34.5* 35.8* 35.1*  PLT 276 259 236  APTT -- -- --  LABPROT 24.4* 21.6* 22.7*  INR 2.15* 1.84* 1.96*  HEPARINUNFRC -- -- --  CREATININE 0.79 -- 0.84  CKTOTAL -- -- --  CKMB -- -- --  TROPONINI -- -- --   Estimated Creatinine Clearance: 44.2 ml/min (by C-G formula based on Cr of 0.79).   Medications:  Prescriptions prior to admission  Medication Sig Dispense Refill  . acetaminophen (TYLENOL) 325 MG tablet Take 2 tablets (650 mg total) by mouth every 6 (six) hours as needed (or Fever >/= 101).  30 tablet    . atenolol (TENORMIN) 50 MG tablet Take 50 mg by mouth 2 (two) times daily.        . Calcium Carbonate-Vitamin D ([employer] CALCIUM 600 + D) 600-400 MG-UNIT per tablet Take 1 tablet by mouth 2 (two) times daily.        . cyanocobalamin (,VITAMIN B-12,) 1000 MCG/ML injection Inject 1 mL (1,000 mcg total) into the muscle once a week.  1 mL    . docusate sodium 100 MG CAPS Take 100 mg by mouth 2  (two) times daily.  10 capsule    . HYDROcodone-acetaminophen (NORCO) 5-325 MG per tablet Take 1 tablet by mouth every 6 (six) hours as needed for pain.  20 tablet  0  . polysaccharide iron (NIFEREX) 150 MG CAPS capsule Take 1 capsule (150 mg total) by mouth daily.  30 each    . traZODone (DESYREL) 50 MG tablet Take 50 mg by mouth at bedtime. For insomnia      . warfarin (COUMADIN) 5 MG tablet Take 1 tablet (5 mg total) by mouth at bedtime.       Scheduled:    . amiodarone  200 mg Oral Daily  . antiseptic oral rinse  15 mL Mouth Rinse 6 X Daily  . carvedilol  9.375 mg Oral BID WC  . chlorhexidine  15 mL Mouth Rinse BID  . clopidogrel  75 mg Oral Q breakfast  . cyanocobalamin  1,000 mcg Subcutaneous Weekly  . feeding supplement  30 mL Per Tube BID  . free water  250 mL Per Tube Q6H  . furosemide  40 mg Per Tube Daily  . levalbuterol  0.63 mg Nebulization TID  . lisinopril  2.5 mg Per Tube BID  . rosuvastatin  10 mg Per Tube Daily  . sodium chloride  10 mL  Intracatheter Q12H  . sodium chloride  3 mL Intravenous Q12H  . spironolactone  25 mg Per Tube BID  . warfarin  5 mg Oral ONCE-1800  . DISCONTD: lisinopril  2.5 mg Oral BID  . DISCONTD: rosuvastatin  10 mg Oral Daily  . DISCONTD: spironolactone  25 mg Oral BID

## 2011-01-30 NOTE — Progress Notes (Signed)
CSW continues to follow for d/c planning. Unable to secure bed offers as pt with panda at present. Will fax pt out again when this issue is resolved (ie, peg?).  Baxter Flattery, MSW [phone]

## 2011-01-31 DIAGNOSIS — Z930 Tracheostomy status: Secondary | ICD-10-CM

## 2011-01-31 LAB — CBC
HCT: 33.7 % — ABNORMAL LOW (ref 36.0–46.0)
Hemoglobin: 10.5 g/dL — ABNORMAL LOW (ref 12.0–15.0)
RBC: 3.96 MIL/uL (ref 3.87–5.11)
WBC: 8.1 10*3/uL (ref 4.0–10.5)

## 2011-01-31 LAB — PROTIME-INR: INR: 2.58 — ABNORMAL HIGH (ref 0.00–1.49)

## 2011-01-31 LAB — GLUCOSE, CAPILLARY: Glucose-Capillary: 136 mg/dL — ABNORMAL HIGH (ref 70–99)

## 2011-01-31 MED ORDER — WARFARIN SODIUM 2 MG PO TABS
2.0000 mg | ORAL_TABLET | Freq: Once | ORAL | Status: AC
  Administered 2011-01-31: 2 mg via ORAL
  Filled 2011-01-31: qty 1

## 2011-01-31 NOTE — Progress Notes (Signed)
ANTICOAGULATION CONSULT NOTE - Follow Up Consult  Pharmacy Consult for Warfarin Indication: R femoral DVT  Assessment: 66 y.o. F on warfarin for RLE DVT (11/2010) resumed on 01/28/11 after being held for several days due to trach site bleeding. INR this a.m is therapeutic (INR 2.58, goal of 2-3). Per discussion with nurse, small amount of blood noted only while suctioning.  Goal of Therapy:  INR 2-3   Plan:  1. Warfarin 2 mg x 1 dose at 1800 today 2. Will continue to monitor for any signs/symptoms of bleeding and will follow up with PT/INR in the a.m.   Georgina Pillion Ann 01/30/2011,10:13 AM   No Known Allergies  Patient Measurements: Height: 5' (152.4 cm) Weight: 96 lb 5.5 oz (43.7 kg) IBW/kg (Calculated) : 45.5    Vital Signs: Temp: 97.9 F (36.6 C) (01/04 0606) Temp src: Oral (01/04 0606) BP: 99/69 mmHg (01/04 0606) Pulse Rate: 79  (01/04 0606)  Labs:  Basename 01/31/11 0605 01/30/11 0522 01/29/11 0537  HGB 10.5* 10.5* --  HCT 33.7* 34.5* 35.8*  PLT 304 276 259  APTT -- -- --  LABPROT 28.1* 24.4* 21.6*  INR 2.58* 2.15* 1.84*  HEPARINUNFRC -- -- --  CREATININE -- 0.79 --  CKTOTAL -- -- --  CKMB -- -- --  TROPONINI -- -- --   Estimated Creatinine Clearance: 47.1 ml/min (by C-G formula based on Cr of 0.79).   Medications:  Prescriptions prior to admission  Medication Sig Dispense Refill  . acetaminophen (TYLENOL) 325 MG tablet Take 2 tablets (650 mg total) by mouth every 6 (six) hours as needed (or Fever >/= 101).  30 tablet    . atenolol (TENORMIN) 50 MG tablet Take 50 mg by mouth 2 (two) times daily.        . Calcium Carbonate-Vitamin D ([employer]S CALCIUM 600 + D) 600-400 MG-UNIT per tablet Take 1 tablet by mouth 2 (two) times daily.        . cyanocobalamin (,VITAMIN B-12,) 1000 MCG/ML injection Inject 1 mL (1,000 mcg total) into the muscle once a week.  1 mL    . docusate sodium 100 MG CAPS Take 100 mg by mouth 2 (two) times daily.  10 capsule    .  HYDROcodone-acetaminophen (NORCO) 5-325 MG per tablet Take 1 tablet by mouth every 6 (six) hours as needed for pain.  20 tablet  0  . polysaccharide iron (NIFEREX) 150 MG CAPS capsule Take 1 capsule (150 mg total) by mouth daily.  30 each    . traZODone (DESYREL) 50 MG tablet Take 50 mg by mouth at bedtime. For insomnia      . warfarin (COUMADIN) 5 MG tablet Take 1 tablet (5 mg total) by mouth at bedtime.       Scheduled:     . amiodarone  200 mg Oral Daily  . antiseptic oral rinse  15 mL Mouth Rinse 6 X Daily  . carvedilol  9.375 mg Oral BID WC  . chlorhexidine  15 mL Mouth Rinse BID  . clopidogrel  75 mg Oral Q breakfast  . cyanocobalamin  1,000 mcg Subcutaneous Weekly  . feeding supplement  30 mL Per Tube BID  . free water  250 mL Per Tube Q6H  . furosemide  40 mg Per Tube Daily  . levalbuterol  0.63 mg Nebulization TID  . lisinopril  2.5 mg Per Tube BID  . rosuvastatin  10 mg Per Tube Daily  . sodium chloride  10 mL Intracatheter Q12H  .  sodium chloride  3 mL Intravenous Q12H  . spironolactone  25 mg Per Tube BID  . warfarin  3 mg Oral ONCE-1800

## 2011-01-31 NOTE — Progress Notes (Signed)
Speech Language/Pathology  Reviewed chart. Noted plan to downsize to 4.0 cuffless trach. Hopeful for improved upper airway for both ability to tolerate PMSV and functional swallow. Will f/u 1/5 after trach has been downsized.   Ferdinand Lango MA, CCC-SLP [phone]

## 2011-01-31 NOTE — Progress Notes (Signed)
Name: [name] MRN: [id number] DOB: 1946-01-12    LOS: 30  PCCM PROGRESS NOTE  History of Present Illness: 66 y/o BF with HTN suffered fall on 11/27  and LE pain> [facility] dx R femoral DVT and started on anticoagulation. Pt had persistent pain and she had further imaging studies which showed R femoral fracture. Pt was transferred to [facility] for further management and was taken to the OR on 12/07> ORIF  right femur fracture. Post op suffered episode of Torsades and underwent 7-10 mins CPR. Subsequently developed AFRVR and hypotension. EKG demonstrated ST elevation on the inferior leads. Underwent cardiac cath 12/7. PCCM asked to assist with vent/CCM issues.  Extubated 01/10/11 and transferred out of 2300 12/18.  However on 12/20 developed acute onset shortness of breath, likely due to aspiration, re-intubated/ then trached, weaned and tx to floor 1/2  Lines / Drains: 12/07 L IJ CVL>>> 12/14 12/07 ETT>>> 12/10, 12/11 >>> 12/14 ,  12/21 ETT >> 12/21 12/14 PICC >>> 01/28/11 12/21 perc trach (DF/KZ) >>  Cultures: 12/10 UC>>>neg 12/10 RC>>>neg 12/20 MRSA  Screen > neg 12/21 Sputum> neg 12/21 blood> neg x 2 12/27 Sputum> few candida only  Antibiotics: 12/10 Vancomycin >>> 12/11  12/10 Ceftazidime (fever, purulent sputum, cloudy urine) >>> 12/18 12/21 vanc (HCAP vs. Asp PNA) >>12/29 12/21 Zosyn (HCAP vs. Asp PNA) >> 1/1 12/21 Cipro (HCAP vs. 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 Severe left ventricular dysfunction with inferior apical and distal anterior hypokinesis of severe degree. The estimated ejection fraction is 25%.  12/08 CT head: No acute abnormalities  12/08 Echo: Systolic function was moderately to severely reduced. The  estimated ejection fraction was in the range of 30% to 35%. There is akinesis of the apical myocardium. There is akinesis of the inferoposterior myocardium. There is hypokinesis of the lateral myocardium  12/11 Reintubated for sudden onset resp distress  12/11 CTA chest: No evidence of pulmonary embolus. Moderate bilateral effusions with significant consolidation and atelectasis in both lungs. Perihilar infiltration suggesting edema. Indeterminate mass or fluid collection in the right supraclavicular region measuring about 3 x 2.2 cm.  12/14 Extubated 12/20 Acute resp distress, transfer to 3300 12/21 Intubated for aspiration pneumonia 12/28 Transferred from SDU to ICU for trach site bleeding 12/31 Transferred to SDU. Failed FEES 01/28/11   Warfarin resumed  Overnight:   No acute events.   Vital Signs: Temp:  [97.7 F (36.5 C)-100.4 F (38 C)] 98.6 F (37 C) (01/04 1346) Pulse Rate:  [70-79] 73  (01/04 1346) Resp:  [19-28] 22  (01/04 1346) BP: (92-99)/(61-69) 93/62 mmHg (01/04 1346) SpO2:  [97 %-100 %] 97 % (01/04 1346) FiO2 (%):  [28 %] 28 % (01/04 1346) Weight:  [96 lb 5.5 oz (43.7 kg)] 96 lb 5.5 oz (43.7 kg) (01/04 0517)   Intake/Output Summary (Last 24 hours) at 01/31/11 1355 Last data filed at 01/31/11 1100  Gross per 24 hour  Intake      0 ml  Output      2 ml  Net     -2 ml    Physical Examination: Gen: Chronically ill appearing, NAD. HEENT: NCAT, PERRL, Trach site clean PULM: Clear anteriorly CV: RRR, Nl S1/S2, -M/R/G. AB: BS+, soft,  nontender, tol TF Ext: warm, no edema, no clubbing, no cyanosis; R PICC site dressing c/d/i Derm: no rash or skin breakdown Neuro: AAOx4, communicates appropriately   LABS  Lab 01/30/11 0522 01/28/11 0500 01/27/11 0430  NA 146* 149* 148*  K 3.4* 3.2* 3.5  CL 110 113* 114*  CO2 26 28 28   BUN 22 21 17   CREATININE 0.79 0.84 0.85  GLUCOSE 124* 125* 145*    Lab 01/31/11 0605 01/30/11 0522 01/29/11 0537  HGB 10.5* 10.5* 10.9*    HCT 33.7* 34.5* 35.8*  WBC 8.1 8.0 8.6  PLT 304 276 259     Assessment and Plan:  Ischemic cardiomyopathy, MI, status post torsades cardiac arrest, atrial fibrillation with RVR, hypertension.  Status post bare metal stent placement. Cardiology following.   - Continue ASA, Plavix (d/c after 30 days per Cardiology-->started 12/8). - Continue Metoprolol, Lisinopril. - Continue Crestor. - Continue Amiodarone (for 6-8 weeks per Cardiology-->started 12/17).  Respiratory Failure: aspiration pneumonia vs. HCAP (WBC up, febrile), Patient had significant clots in the right mainstem that was removed bronchoscopically. - Maintain on TC. - Downsize to #4 Cuffless 1/4 - Would like to work towards decannulation but all care providers should keep in mind that she suffered multiple episodes of poorly explained sudden onset respiratory events that required intubation   Hypokalemia  Lab 01/30/11 0522 01/28/11 0500 01/27/11 0430 01/26/11 0620 01/25/11 0500  K 3.4* 3.2* 3.5 2.8* 4.2  - Increased spironolactone to BID 1/1 - BMET 1/3> trending up on spirolactone so no rx  Right Lower Extremity DVT  - Heparin and coumadin held due to trach site bleeding 12/28 episode. No further bleeding.    Resumed warfarin 1/1   Anemia, bleeding from trach site on plavix, heparin, ASA  Lab 01/31/11 0605 01/30/11 0522 01/29/11 0537 01/28/11 0500 01/27/11 0430  HCT 33.7* 34.5* 35.8* 35.1* 35.4*  - PRBC for Hgb < 7.0.  Malnutrition, dysphagia.  Swallowing assessment completed. Severe dysphagia on FEES 12/31. Panda tube replaced and back on TFs @ goal -will need repeat swallow eval after #4 trach placed  Encephalopathy / delirium -Resolved   DISPO ? If will need short term rehab.  Currently, needs to be downsized and pass swallow eval.   Canary Brim, NP-C [facility] Pgr: [phone]   Pt independently  seen and examined and available cxr's reviewed and I agree with above findings/  imp/ plan    Sandrea Hughs, MD Pulmonary and Critical Care Medicine [facility] Cell [phone]

## 2011-02-01 DIAGNOSIS — I469 Cardiac arrest, cause unspecified: Secondary | ICD-10-CM

## 2011-02-01 LAB — PROTIME-INR
INR: 2.58 — ABNORMAL HIGH (ref 0.00–1.49)
Prothrombin Time: 28.1 seconds — ABNORMAL HIGH (ref 11.6–15.2)

## 2011-02-01 LAB — BASIC METABOLIC PANEL
Chloride: 100 mEq/L (ref 96–112)
Creatinine, Ser: 0.64 mg/dL (ref 0.50–1.10)
GFR calc Af Amer: >90 mL/min (ref >90)
Sodium: 137 mEq/L (ref 135–145)

## 2011-02-01 LAB — CBC
Hemoglobin: 12.2 g/dL (ref 12.0–15.0)
MCHC: 32.4 g/dL (ref 30.0–36.0)
RBC: 4.46 MIL/uL (ref 3.87–5.11)
WBC: 9.7 10*3/uL (ref 4.0–10.5)

## 2011-02-01 MED ORDER — WARFARIN SODIUM 2.5 MG PO TABS
2.5000 mg | ORAL_TABLET | Freq: Once | ORAL | Status: AC
  Administered 2011-02-01: 2.5 mg via ORAL
  Filled 2011-02-01: qty 1

## 2011-02-01 NOTE — Progress Notes (Signed)
ANTICOAGULATION CONSULT NOTE - Follow Up Consult  Pharmacy Consult for Warfarin Indication: R femoral DVT  Assessment: 66 y.o. F on warfarin for RLE DVT (11/2010) resumed on 01/28/11 after being held for several days due to trach site bleeding. INR this a.m is therapeutic (INR 2.58, goal of 2-3). Hgb/Hct/Plt stable. No s/sx of bleeding noted except for small amount of blood while suctioning.  Goal of Therapy:  INR 2-3   Plan:  1. Warfarin 2.5 mg x 1 dose at 1800 today 2. Will continue to monitor for any signs/symptoms of bleeding and will follow up with PT/INR in the a.m.   Georgina Pillion, PharmD, BCPS 02/01/2011 1:33 PM     No Known Allergies  Patient Measurements: Height: 5' (152.4 cm) Weight: 86 lb 13.8 oz (39.4 kg) IBW/kg (Calculated) : 45.5    Vital Signs: Temp: 98.7 F (37.1 C) (01/05 0441) Temp src: Oral (01/05 0441) BP: 92/58 mmHg (01/05 0858) Pulse Rate: 75  (01/05 1238)  Labs:  [facility] 02/01/11 0800 01/31/11 0605 01/30/11 0522  HGB 12.2 10.5* --  HCT 37.6 33.7* 34.5*  PLT 313 304 276  APTT -- -- --  LABPROT 28.1* 28.1* 24.4*  INR 2.58* 2.58* 2.15*  HEPARINUNFRC -- -- --  CREATININE 0.64 -- 0.79  CKTOTAL -- -- --  CKMB -- -- --  TROPONINI -- -- --   Estimated Creatinine Clearance: 42.4 ml/min (by C-G formula based on Cr of 0.64).   Medications:  Prescriptions prior to admission  Medication Sig Dispense Refill  . acetaminophen (TYLENOL) 325 MG tablet Take 2 tablets (650 mg total) by mouth every 6 (six) hours as needed (or Fever >/= 101).  30 tablet    . atenolol (TENORMIN) 50 MG tablet Take 50 mg by mouth 2 (two) times daily.        . Calcium Carbonate-Vitamin D ([employer] CALCIUM 600 + D) 600-400 MG-UNIT per tablet Take 1 tablet by mouth 2 (two) times daily.        . cyanocobalamin (,VITAMIN B-12,) 1000 MCG/ML injection Inject 1 mL (1,000 mcg total) into the muscle once a week.  1 mL    . docusate sodium 100 MG CAPS Take 100 mg by mouth 2 (two) times  daily.  10 capsule    . HYDROcodone-acetaminophen (NORCO) 5-325 MG per tablet Take 1 tablet by mouth every 6 (six) hours as needed for pain.  20 tablet  0  . polysaccharide iron (NIFEREX) 150 MG CAPS capsule Take 1 capsule (150 mg total) by mouth daily.  30 each    . traZODone (DESYREL) 50 MG tablet Take 50 mg by mouth at bedtime. For insomnia      . warfarin (COUMADIN) 5 MG tablet Take 1 tablet (5 mg total) by mouth at bedtime.       Scheduled:     . amiodarone  200 mg Oral Daily  . antiseptic oral rinse  15 mL Mouth Rinse 6 X Daily  . carvedilol  9.375 mg Oral BID WC  . chlorhexidine  15 mL Mouth Rinse BID  . clopidogrel  75 mg Oral Q breakfast  . cyanocobalamin  1,000 mcg Subcutaneous Weekly  . feeding supplement  30 mL Per Tube BID  . free water  250 mL Per Tube Q6H  . furosemide  40 mg Per Tube Daily  . levalbuterol  0.63 mg Nebulization TID  . lisinopril  2.5 mg Per Tube BID  . rosuvastatin  10 mg Per Tube Daily  . sodium chloride  10 mL Intracatheter Q12H  . sodium chloride  3 mL Intravenous Q12H  . spironolactone  25 mg Per Tube BID  . warfarin  2 mg Oral ONCE-1800

## 2011-02-01 NOTE — Progress Notes (Signed)
Name: [name] MRN: [id number] DOB: Mar 17, 1945    LOS: 31  PCCM PROGRESS NOTE  History of Present Illness: 66 y/o BF with HTN suffered fall on 11/27  and LE pain> APH dx R femoral DVT and started on anticoagulation. Pt had persistent pain and she had further imaging studies which showed R femoral fracture. Pt was transferred to [facility] for further management and was taken to the OR on 12/07> ORIF  right femur fracture. Post op suffered episode of Torsades and underwent 7-10 mins CPR. Subsequently developed AFRVR and hypotension. EKG demonstrated ST elevation on the inferior leads. Underwent cardiac cath 12/7. PCCM asked to assist with vent/CCM issues.  Extubated 01/10/11 and transferred out of 2300 12/18.  However on 12/20 developed acute onset shortness of breath, likely due to aspiration, re-intubated/ then trached, weaned and tx to floor 1/2  Lines / Drains: 12/07 L IJ CVL>>> 12/14 12/07 ETT>>> 12/10, 12/11 >>> 12/14 ,  12/21 ETT >> 12/21 12/14 PICC >>> 01/28/11 12/21 perc trach (DF/KZ) >>  Cultures: 12/10 UC>>>neg 12/10 RC>>>neg 12/20 MRSA  Screen > neg 12/21 Sputum> neg 12/21 blood> neg x 2 12/27 Sputum> few candida only  Antibiotics: 12/10 Vancomycin >>> 12/11  12/10 Ceftazidime (fever, purulent sputum, cloudy urine) >>> 12/18 12/21 vanc (HCAP vs. Asp PNA) >>12/29 12/21 Zosyn (HCAP vs. Asp PNA) >> 1/1 12/21 Cipro (HCAP vs. 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 Severe left ventricular dysfunction with inferior apical and distal anterior hypokinesis of severe degree. The estimated ejection fraction is 25%.  12/08 CT head: No acute abnormalities  12/08 Echo: Systolic function was moderately to severely reduced. The  estimated ejection fraction was in the range of 30% to 35%. There is akinesis of the apical myocardium. There is akinesis of the inferoposterior myocardium. There is hypokinesis of the lateral myocardium  12/11 Reintubated for sudden onset resp distress  12/11 CTA chest: No evidence of pulmonary embolus. Moderate bilateral effusions with significant consolidation and atelectasis in both lungs. Perihilar infiltration suggesting edema. Indeterminate mass or fluid collection in the right supraclavicular region measuring about 3 x 2.2 cm.  12/14 Extubated 12/20 Acute resp distress, transfer to 3300 12/21 Intubated for aspiration pneumonia 12/28 Transferred from SDU to ICU for trach site bleeding 12/31 Transferred to SDU. Failed FEES 01/28/11   Warfarin resumed  Overnight:   She denies acute needs or problems, but wants to know when trach can come out.  Vital Signs: Temp:  [98.2 F (36.8 C)-98.7 F (37.1 C)] 98.7 F (37.1 C) (01/05 0441) Pulse Rate:  [68-79] 68  (01/05 0858) Resp:  [18-22] 20  (01/05 0441) BP: (92-98)/(58-65) 92/58 mmHg (01/05 0858) SpO2:  [97 %-100 %] 98 % (01/05 0813) FiO2 (%):  [28 %] 28 % (01/05 0813) Weight:  [39.4 kg (86 lb 13.8 oz)] 86 lb 13.8 oz (39.4 kg) (01/05 0441)   Intake/Output Summary (Last 24 hours) at 02/01/11 0950 Last data filed at 01/31/11 1500  Gross per 24 hour  Intake      0 ml  Output    303 ml  Net   -303 ml    Physical Examination: Gen: Chronically ill appearing, NAD. Alert, pleasant, frail, seems oriented HEENT: NCAT, PERRL, Trach site  clean PULM: Raspy large airway sounds, unlabored CV: RRR, Nl S1/S2, -M/R/G. AB: BS+, soft, nontender, tol TF per nasal panda Ext: warm, no edema, no clubbing, no cyanosis; R PICC site dressing c/d/i Derm: no rash or skin breakdown Neuro: AAOx4, communicates appropriately   LABS  Lab 01/30/11 0522 01/28/11 0500 01/27/11 0430  NA 146* 149* 148*  K 3.4* 3.2* 3.5  CL 110 113* 114*  CO2 26 28 28   BUN  22 21 17   CREATININE 0.79 0.84 0.85  GLUCOSE 124* 125* 145*    Lab 01/31/11 0605 01/30/11 0522 01/29/11 0537  HGB 10.5* 10.5* 10.9*  HCT 33.7* 34.5* 35.8*  WBC 8.1 8.0 8.6  PLT 304 276 259     Assessment and Plan:  Ischemic cardiomyopathy, MI, status post torsades cardiac arrest, atrial fibrillation with RVR, hypertension.  Status post bare metal stent placement. Cardiology following.   - Continue ASA, Plavix (d/c after 30 days per Cardiology-->started 12/8). - Continue Metoprolol, Lisinopril. - Continue Crestor. - Continue Amiodarone (for 6-8 weeks per Cardiology-->started 12/17).  Respiratory Failure: aspiration pneumonia vs. HCAP (WBC up, febrile), Patient had significant clots in the right mainstem that was removed bronchoscopically. - Maintain on TC. - Downsize to #4 Cuffless 1/4 Watching need for suctioning. If can't progress wean first of week, then eval for PM speech valve - Would like to work towards decannulation but all care providers should keep in mind that she suffered multiple episodes of poorly explained sudden onset respiratory events that required intubation   Hypokalemia  Lab 01/30/11 0522 01/28/11 0500 01/27/11 0430 01/26/11 0620  K 3.4* 3.2* 3.5 2.8*  - Increased spironolactone to BID 1/1 - BMET 1/3> trending up on spirolactone so no rx -BMET 1/6  Right Lower Extremity DVT  - Heparin and coumadin held due to trach site bleeding 12/28 episode. No further bleeding.    Resumed warfarin 1/1   Anemia, bleeding from trach site on plavix, heparin, ASA  Lab 01/31/11 0605 01/30/11 0522 01/29/11 0537 01/28/11 0500 01/27/11 0430  HCT 33.7* 34.5* 35.8* 35.1* 35.4*  - PRBC for Hgb < 7.0.  Malnutrition, dysphagia.  Swallowing assessment completed. Severe dysphagia on FEES 12/31. Panda tube replaced and back on TFs @ goal -will need repeat swallow eval after #4 trach placed  Encephalopathy / delirium -Resolved   DISPO ? If will need short term rehab.   Currently, needs to be downsized and pass swallow eval.

## 2011-02-02 DIAGNOSIS — J9600 Acute respiratory failure, unspecified whether with hypoxia or hypercapnia: Secondary | ICD-10-CM

## 2011-02-02 DIAGNOSIS — Z930 Tracheostomy status: Secondary | ICD-10-CM

## 2011-02-02 DIAGNOSIS — R1310 Dysphagia, unspecified: Secondary | ICD-10-CM

## 2011-02-02 DIAGNOSIS — I469 Cardiac arrest, cause unspecified: Secondary | ICD-10-CM

## 2011-02-02 LAB — BASIC METABOLIC PANEL WITH GFR
BUN: 23 mg/dL (ref 6–23)
CO2: 26 meq/L (ref 19–32)
Calcium: 9.1 mg/dL (ref 8.4–10.5)
Chloride: 96 meq/L (ref 96–112)
Creatinine, Ser: 0.58 mg/dL (ref 0.50–1.10)
GFR calc Af Amer: >90 mL/min
GFR calc non Af Amer: >90 mL/min
Glucose, Bld: 100 mg/dL — ABNORMAL HIGH (ref 70–99)
Potassium: 3.6 meq/L (ref 3.5–5.1)
Sodium: 133 meq/L — ABNORMAL LOW (ref 135–145)

## 2011-02-02 LAB — PROTIME-INR: INR: 2.84 — ABNORMAL HIGH (ref 0.00–1.49)

## 2011-02-02 LAB — CBC
HCT: 33.7 % — ABNORMAL LOW (ref 36.0–46.0)
Platelets: 298 10*3/uL (ref 150–400)
RDW: 17.5 % — ABNORMAL HIGH (ref 11.5–15.5)
WBC: 9.9 10*3/uL (ref 4.0–10.5)

## 2011-02-02 LAB — APTT: aPTT: 39 seconds — ABNORMAL HIGH (ref 24–37)

## 2011-02-02 MED ORDER — WARFARIN 1.25 MG HALF TABLET
1.2500 mg | ORAL_TABLET | Freq: Once | ORAL | Status: AC
  Administered 2011-02-02: 1.25 mg via ORAL
  Filled 2011-02-02 (×2): qty 1

## 2011-02-02 MED ORDER — BIOTENE DRY MOUTH MT LIQD
15.0000 mL | Freq: Every day | OROMUCOSAL | Status: DC
  Administered 2011-02-02 – 2011-02-07 (×22): 15 mL via OROMUCOSAL

## 2011-02-02 NOTE — Progress Notes (Signed)
Name: [name] MRN: [id number] DOB: 1945-02-11    LOS: 32  PCCM PROGRESS NOTE  History of Present Illness: 66 y/o BF with HTN suffered fall on 11/27  and LE pain> APH dx R femoral DVT and started on anticoagulation. Pt had persistent pain and she had further imaging studies which showed R femoral fracture. Pt was transferred to [facility] for further management and was taken to the OR on 12/07> ORIF  right femur fracture. Post op suffered episode of Torsades and underwent 7-10 mins CPR. Subsequently developed AFRVR and hypotension. EKG demonstrated ST elevation on the inferior leads. Underwent cardiac cath 12/7. PCCM asked to assist with vent/CCM issues.  Extubated 01/10/11 and transferred out of 2300 12/18.  However on 12/20 developed acute onset shortness of breath, likely due to aspiration, re-intubated/ then trached, weaned and tx to floor 1/2  Lines / Drains: 12/07 L IJ CVL>>> 12/14 12/07 ETT>>> 12/10, 12/11 >>> 12/14 ,  12/21 ETT >> 12/21 12/14 PICC >>> 01/28/11 12/21 perc trach (DF/KZ) >>  Cultures: 12/10 UC>>>neg 12/10 RC>>>neg 12/20 MRSA  Screen > neg 12/21 Sputum> neg 12/21 blood> neg x 2 12/27 Sputum> few candida only  Antibiotics: 12/10 Vancomycin >>> 12/11  12/10 Ceftazidime (fever, purulent sputum, cloudy urine) >>> 12/18 12/21 vanc (HCAP vs. Asp PNA) >>12/29 12/21 Zosyn (HCAP vs. Asp PNA) >> 1/1 12/21 Cipro (HCAP vs. 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 Severe left ventricular dysfunction with inferior apical and distal anterior hypokinesis of severe degree. The estimated ejection fraction is 25%.  12/08 CT head: No acute abnormalities  12/08 Echo: Systolic function was moderately to severely reduced. The  estimated ejection fraction was in the range of 30% to 35%. There is akinesis of the apical myocardium. There is akinesis of the inferoposterior myocardium. There is hypokinesis of the lateral myocardium  12/11 Reintubated for sudden onset resp distress  12/11 CTA chest: No evidence of pulmonary embolus. Moderate bilateral effusions with significant consolidation and atelectasis in both lungs. Perihilar infiltration suggesting edema. Indeterminate mass or fluid collection in the right supraclavicular region measuring about 3 x 2.2 cm.  12/14 Extubated 12/20 Acute resp distress, transfer to 3300 12/21 Intubated for aspiration pneumonia 12/28 Transferred from SDU to ICU for trach site bleeding 12/31 Transferred to SDU. Failed FEES 01/28/11   Warfarin resumed  Overnight:   She refused blood draws this AM, and indicates to me she is tired of being stuck. I explained and I think she is willing to let lab come now. She denies pain or dyspnea.  Vital Signs: Temp:  [97.7 F (36.5 C)-98.5 F (36.9 C)] 97.7 F (36.5 C) (01/06 0713) Pulse Rate:  [69-87] 69  (01/06 0900) Resp:  [16-21] 18  (01/06 0729) BP: (83-106)/(56-72) 95/69 mmHg (01/06 0900) SpO2:  [96 %-100 %] 100 % (01/06 0729) FiO2 (%):  [28 %] 28 % (01/06 0729)   Intake/Output Summary (Last 24 hours) at 02/02/11 0950 Last data filed at 02/01/11 1114  Gross per 24 hour  Intake      0 ml  Output      2 ml  Net     -2 ml    Physical Examination: Gen: Chronically ill appearing, NAD. Passive, seems withdrawn  HEENT: NCAT, PERRL, Trach site clean PULM: Raspy large airway sounds, unlabored- noted when encouraged deep breath CV: RRR, Nl S1/S2, -M/R/G. AB: BS+, soft, nontender, tol TF per nasal panda Ext: warm, no edema, no clubbing, no cyanosis; R PICC site dressing c/d/i Derm: no rash or skin breakdown Neuro: AAOx4, communicates appropriately   LABS  Lab 02/01/11 0800 01/30/11 0522 01/28/11 0500  NA 137 146* 149*  K 3.2* 3.4* 3.2*   CL 100 110 113*  CO2 24 26 28   BUN 21 22 21   CREATININE 0.64 0.79 0.84  GLUCOSE 113* 124* 125*    Lab 02/01/11 0800 01/31/11 0605 01/30/11 0522  HGB 12.2 10.5* 10.5*  HCT 37.6 33.7* 34.5*  WBC 9.7 8.1 8.0  PLT 313 304 276     Assessment and Plan:  Ischemic cardiomyopathy, MI, status post torsades cardiac arrest, atrial fibrillation with RVR, hypertension.  Status post bare metal stent placement. Cardiology following.   - Continue ASA, Plavix (d/c after 30 days per Cardiology-->started 12/8). - Continue Metoprolol, Lisinopril. - Continue Crestor. - Continue Amiodarone (for 6-8 weeks per Cardiology-->started 12/17).  Respiratory Failure: aspiration pneumonia vs. HCAP (WBC up, febrile), Patient had significant clots in the right mainstem that was removed bronchoscopically. - Maintain on TC. - Downsize to #4 Cuffless 1/4 Watching need for suctioning. If can't progress wean first of week, then eval for PM speech valve - Would like to work towards decannulation but all care providers should keep in mind that she suffered multiple episodes of poorly explained sudden onset respiratory events that required intubation - CXR 1/7   Hypokalemia  Lab 02/01/11 0800 01/30/11 0522 01/28/11 0500 01/27/11 0430  K 3.2* 3.4* 3.2* 3.5  - Increased spironolactone to BID 1/1 - BMET 1/3> trending up on spirolactone so no rx -BMET 1/6 If she will allow lab draw  Right Lower Extremity DVT  - Heparin and coumadin held due to trach site bleeding 12/28 episode. No further bleeding.    Resumed warfarin 1/1   Anemia, bleeding from trach site on plavix, heparin, ASA  Lab 02/01/11 0800 01/31/11 0605 01/30/11 0522 01/29/11 0537 01/28/11 0500  HCT 37.6 33.7* 34.5* 35.8* 35.1*  - PRBC for Hgb < 7.0.  Malnutrition, dysphagia.  Swallowing assessment completed. Severe dysphagia on FEES 12/31. Panda tube replaced and back on TFs @ goal -will need repeat swallow eval after #4 trach  placed  Encephalopathy / delirium -Resolved   DISPO ? If will need short term rehab.  Currently, needs to be downsized and pass swallow eval.

## 2011-02-02 NOTE — Progress Notes (Signed)
Patient violently refused this morning lab work, did not effectively communicate her reason for refusal but was demonstrably clear she would not allow the lab draws.

## 2011-02-02 NOTE — Progress Notes (Signed)
ANTICOAGULATION CONSULT NOTE - Follow Up Consult  Pharmacy Consult for Warfarin Indication: R femoral DVT  Assessment: 66 y.o. F on warfarin for RLE DVT (11/2010) resumed on 01/28/11 after being held for several days due to trach site bleeding. INR this a.m is therapeutic (INR 2.84, goal of 2-3). Hgb/Hct/Plt stable. No s/sx of bleeding noted except for small amount of blood while suctioning.  Goal of Therapy:  INR 2-3   Plan:  1. Warfarin 1.25 mg x 1 dose at 1800 today 2. Will continue to monitor for any signs/symptoms of bleeding and will follow up with PT/INR in the a.m.   Georgina Pillion, PharmD, BCPS 02/02/2011 3:11 PM     No Known Allergies  Patient Measurements: Height: 5' (152.4 cm) Weight: 86 lb 13.8 oz (39.4 kg) IBW/kg (Calculated) : 45.5    Vital Signs: Temp: 97.4 F (36.3 C) (01/06 1413) Temp src: Oral (01/06 1413) BP: 98/70 mmHg (01/06 1413) Pulse Rate: 80  (01/06 1413)  Labs:  Basename 02/02/11 1013 02/01/11 0800 01/31/11 0605  HGB 10.9* 12.2 --  HCT 33.7* 37.6 33.7*  PLT 298 313 304  APTT 39* -- --  LABPROT 30.3* 28.1* 28.1*  INR 2.84* 2.58* 2.58*  HEPARINUNFRC -- -- --  CREATININE 0.58 0.64 --  CKTOTAL -- -- --  CKMB -- -- --  TROPONINI -- -- --   Estimated Creatinine Clearance: 42.4 ml/min (by C-G formula based on Cr of 0.58).   Medications:  Prescriptions prior to admission  Medication Sig Dispense Refill  . acetaminophen (TYLENOL) 325 MG tablet Take 2 tablets (650 mg total) by mouth every 6 (six) hours as needed (or Fever >/= 101).  30 tablet    . atenolol (TENORMIN) 50 MG tablet Take 50 mg by mouth 2 (two) times daily.        . Calcium Carbonate-Vitamin D ([employer] CALCIUM 600 + D) 600-400 MG-UNIT per tablet Take 1 tablet by mouth 2 (two) times daily.        . cyanocobalamin (,VITAMIN B-12,) 1000 MCG/ML injection Inject 1 mL (1,000 mcg total) into the muscle once a week.  1 mL    . docusate sodium 100 MG CAPS Take 100 mg by mouth 2 (two) times  daily.  10 capsule    . HYDROcodone-acetaminophen (NORCO) 5-325 MG per tablet Take 1 tablet by mouth every 6 (six) hours as needed for pain.  20 tablet  0  . polysaccharide iron (NIFEREX) 150 MG CAPS capsule Take 1 capsule (150 mg total) by mouth daily.  30 each    . traZODone (DESYREL) 50 MG tablet Take 50 mg by mouth at bedtime. For insomnia      . warfarin (COUMADIN) 5 MG tablet Take 1 tablet (5 mg total) by mouth at bedtime.       Scheduled:     . amiodarone  200 mg Oral Daily  . antiseptic oral rinse  15 mL Mouth Rinse 6 X Daily  . carvedilol  9.375 mg Oral BID WC  . chlorhexidine  15 mL Mouth Rinse BID  . clopidogrel  75 mg Oral Q breakfast  . cyanocobalamin  1,000 mcg Subcutaneous Weekly  . feeding supplement  30 mL Per Tube BID  . free water  250 mL Per Tube Q6H  . furosemide  40 mg Per Tube Daily  . levalbuterol  0.63 mg Nebulization TID  . lisinopril  2.5 mg Per Tube BID  . rosuvastatin  10 mg Per Tube Daily  . sodium chloride  10 mL Intracatheter Q12H  . sodium chloride  3 mL Intravenous Q12H  . spironolactone  25 mg Per Tube BID  . warfarin  2.5 mg Oral ONCE-1800

## 2011-02-03 ENCOUNTER — Inpatient Hospital Stay (HOSPITAL_COMMUNITY): Payer: Medicare Other

## 2011-02-03 LAB — PROTIME-INR
INR: 2.56 — ABNORMAL HIGH (ref 0.00–1.49)
Prothrombin Time: 27.9 seconds — ABNORMAL HIGH (ref 11.6–15.2)

## 2011-02-03 LAB — CBC
MCHC: 32.5 g/dL (ref 30.0–36.0)
Platelets: 286 10*3/uL (ref 150–400)
RDW: 17.5 % — ABNORMAL HIGH (ref 11.5–15.5)
WBC: 10.1 10*3/uL (ref 4.0–10.5)

## 2011-02-03 MED ORDER — WARFARIN SODIUM 2.5 MG PO TABS
2.5000 mg | ORAL_TABLET | Freq: Every day | ORAL | Status: DC
  Administered 2011-02-03 – 2011-02-06 (×4): 2.5 mg via ORAL
  Filled 2011-02-03 (×7): qty 1

## 2011-02-03 NOTE — Progress Notes (Signed)
Physical Therapy Note: Pt denied working with P.T. Today secondary to fatigue after having been in the chair most of the day and having returned to bed not long ago. Will attempt at later date.  Toney Sang, PT [phone]

## 2011-02-03 NOTE — Progress Notes (Signed)
Trach downsized to 4 Shiley Cuffless. Pt tolerated procedure well, positive color change on CO2 detector, BBS present and equal. SATs 100% post change. RT will continue to monitor. Speech Therapy to follow up and eval.

## 2011-02-03 NOTE — Progress Notes (Signed)
UR Completed.  [name] Jane [phone] 02/03/2011  

## 2011-02-03 NOTE — Progress Notes (Signed)
ANTICOAGULATION CONSULT NOTE - Follow Up Consult  Pharmacy Consult for Coumadin Indication:  R femoral DVT (12/24/2010)  No Known Allergies   Patient Measurements: Height: 5' (152.4 cm) Weight: 87 lb 11.9 oz (39.8 kg) IBW/kg (Calculated) : 45.5   Vital Signs: Temp: 97.8 F (36.6 C) (01/07 0600) Temp src: Oral (01/07 0600) BP: 111/70 mmHg (01/07 0600) Pulse Rate: 76  (01/07 0740)  Labs:  Basename 02/03/11 0530 02/02/11 1013 02/01/11 0800  HGB 10.8* 10.9* --  HCT 33.2* 33.7* 37.6  PLT 286 298 313  APTT -- 39* --  LABPROT 27.9* 30.3* 28.1*  INR 2.56* 2.84* 2.58*  HEPARINUNFRC -- -- --  CREATININE -- 0.58 0.64  CKTOTAL -- -- --  CKMB -- -- --  TROPONINI -- -- --   Estimated Creatinine Clearance: 42.9 ml/min (by C-G formula based on Cr of 0.58).  Assessment:   INR therapeutic. CBC stable. No trach site bleeding noted.   On Plavix 75 mg since BMS 12/8 - planned for 30 days.   Also on Amiodarone 200 mg daily - planned for 6-8 weeks, begun 12/17. Had been off Aspirin since 12/25. Noted blood draws initially refused 1/6, tired of daily blood draws.  Coumadin resumed 1/1, INR therapeutic x 5 days.  Goal of Therapy:  INR 2-3   Plan:     Will begin Coumadin 2.5 mg daily.    Decrease PT/INR's to MWF only.    D/C daily CBC.    Will f/u for Plavix discontinuation.      Then add EC Aspirin 81 mg daily?  Dennie Fetters Pager: [phone] 02/03/2011,11:18 AM

## 2011-02-03 NOTE — Consult Note (Signed)
Physical Medicine and Rehabilitation Consult Reason for Consult: right femur fracture Referring Phsyician: Dr. Vassie Loll    HPI: [name] is an 66 y.o. female with history of HTN who sustained a fall 12/24/10 with RLE pain.  Diagnosed with right femoral DVT at [facility] and D/C to [facility] 12/03. Patient continued with pain with xray'sdone 12/05 revealing right femur fracture (distal).  [facility] and under went ORIF right femur on 12/07 by Dr. Carola Frost.  Post op course complicated by cardiac arrest with torsades requiring 7-10 CPR. Patient with afib with RVR and EKG with ST changes. Cardic cath revealed subtotal occluded RCA treated with BMS by Dr. Lurline Idol. Extubated 12/14 but on 12/20 with acute SOB likely due to aspiration requiring intubation.   Tracheostomy placed 12/21 and bronchoscopy on 12/28 for large clots from trachea and mainstem bronchus. Weaned and plans to decanulate in the next few days.  For swallow eval once downsized to #4CFS.  Patient deconditioned and MD recommending CIR.  Is TDWB and needs assistance to maintain WB status.  Review of Systems  Respiratory: Negative for hemoptysis and shortness of breath.   Cardiovascular: Negative for chest pain.  Gastrointestinal: Negative for abdominal pain.  Musculoskeletal: Negative for joint pain.  Neurological: Positive for weakness.   Past Medical History  Diagnosis Date  . Hypertension   . 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 Vitamin B12 deficiency (dietary) anemia 12/27/2010  . Arthritis   . MI (myocardial infarction) 01/09/2011  . Aspiration pneumonia 01/17/2011  . VT (ventricular tachycardia) 01/28/2011   Past Surgical History  Procedure Date  . Abdominal hysterectomy     06/2010  . Femur im nail 01/03/2011    Procedure: INTRAMEDULLARY (IM) RETROGRADE FEMORAL NAILING;  Surgeon: Budd Palmer;  Location: [facility] OR;  Service: Orthopedics;  Laterality: Right;  Right IM Retrograde Femoral Nailing   History  reviewed. No pertinent family history.  Social History: Lives alone and was working full time prior to 11/27 admission.  She reports that she has never smoked. She has never used smokeless tobacco. She reports that she does not drink alcohol or use illicit drugs.  Has a daughter in town who works.   No Known Allergies  Prior to Admission medications   Medication Sig Start Date End Date Taking? Authorizing Provider  atenolol (TENORMIN) 50 MG tablet Take 50 mg by mouth 2 (two) times daily.     Yes Historical Provider, MD  Calcium Carbonate-Vitamin D ([employer] CALCIUM 600 + D) 600-400 MG-UNIT per tablet Take 1 tablet by mouth 2 (two) times daily.     Yes Historical Provider, MD  cyanocobalamin (,VITAMIN B-12,) 1000 MCG/ML injection Inject 1 mL (1,000 mcg total) into the muscle once a week. 12/30/10 12/30/11 Yes Corinna L Sullivan  polysaccharide iron (NIFEREX) 150 MG CAPS capsule Take 1 capsule (150 mg total) by mouth daily. 12/30/10  Yes Corinna L Sullivan  traZODone (DESYREL) 50 MG tablet Take 50 mg by mouth at bedtime. For insomnia   Yes Historical Provider, MD  warfarin (COUMADIN) 5 MG tablet Take 1 tablet (5 mg total) by mouth at bedtime. 12/30/10 12/30/11 Yes Corinna L Lendell Caprice   Scheduled Medications:    . amiodarone  200 mg Oral Daily  . antiseptic oral rinse  15 mL Mouth Rinse 6 X Daily  . carvedilol  9.375 mg Oral BID WC  . chlorhexidine  15 mL Mouth Rinse BID  . clopidogrel  75 mg Oral Q breakfast  . cyanocobalamin  1,000  mcg Subcutaneous Weekly  . feeding supplement  30 mL Per Tube BID  . furosemide  40 mg Per Tube Daily  . levalbuterol  0.63 mg Nebulization TID  . lisinopril  2.5 mg Per Tube BID  . rosuvastatin  10 mg Per Tube Daily  . sodium chloride  10 mL Intracatheter Q12H  . spironolactone  25 mg Per Tube BID  . warfarin  1.25 mg Oral ONCE-1800  . warfarin  2.5 mg Oral q1800  . DISCONTD: antiseptic oral rinse  15 mL Mouth Rinse 6 X Daily  . DISCONTD: free water  250 mL Per Tube  Q6H  . DISCONTD: sodium chloride  3 mL Intravenous Q12H   PRN MED's: sodium chloride, acetaminophen, bisacodyl, levalbuterol, magnesium hydroxide, ondansetron (ZOFRAN) IV, ondansetron (ZOFRAN) IV, sodium chloride, DISCONTD: fentaNYL Home: Home Living Lives With: Alone Receives Help From: Family Type of Home: House Home Layout: Two level;Able to live on main level with bedroom/bathroom Alternate Level Stairs-Rails: Right Alternate Level Stairs-Number of Steps: full flight but doesn't have to go up stairs Home Access: Stairs to enter Entrance Stairs-Rails: Right Entrance Stairs-Number of Steps: 2 Home Adaptive Equipment: None Additional Comments: Will need admit to ST-SNF at d/c.   Functional History: Prior Function Level of Independence: Independent with basic ADLs;Independent with homemaking with ambulation;Independent with gait;Independent with transfers Able to Take Stairs?: Yes Driving: Yes Vocation: Part time employment Comments: PLOF refers to before original fall and admit on 12/27/10. Pt. was recently at [facility].   Functional Status:  Mobility: Bed Mobility Bed Mobility: Yes Supine to Sit: HOB flat;4: Min assist Supine to Sit Details (indicate cue type and reason): Moving better today and needing less cues Sitting - Scoot to Edge of Bed: 4: Min assist Sitting - Scoot to Edge of Bed Details (indicate cue type and reason): Assistance with weight shifts to advance hips to EOB.  Cues for sequence. Sit to Supine - Right: Not Tested (comment) Transfers Transfers: Yes Sit to Stand: 1: +2 Total assist;Patient percentage (comment);With upper extremity assist;From bed (pt= 65%) Sit to Stand Details (indicate cue type and reason): A to initiate transfer and maintain balance.  Unsteady as she attempts to come up to RW and she does keep her weight posterior to her body.  Verbal and manual cues to maintain TDWB on R LE Stand to Sit: 1: +2 Total assist;Patient percentage (comment);With  upper extremity assist;To chair/3-in-1;With armrests (pt = 50%) Stand to Sit Details: Assist needed to control descent with max cues for hand placement Stand Pivot Transfers: 1: +2 Total assist;Patient percentage (comment) (pt = 50%) Stand Pivot Transfer Details (indicate cue type and reason): Assistance to maintain balance with transfer and to maintain TDWB RLE.  Pt. was able to pivot on LLE with RW with PT assisting with maintenance of TDWB on RLE.   Lateral/Scoot Transfers: Not tested (comment) Ambulation/Gait Ambulation/Gait: No Stairs: No Wheelchair Mobility Wheelchair Mobility: No  ADL: ADL Eating/Feeding: Simulated;Set up Where Assessed - Eating/Feeding: Chair Grooming: Performed;Wash/dry face;Minimal assistance Grooming Details (indicate cue type and reason): assist for thoroughness Where Assessed - Grooming: Sitting, chair Upper Body Bathing: Simulated;Chest;Right arm;Left arm;Abdomen;Maximal assistance Where Assessed - Upper Body Bathing: Sitting, chair Lower Body Bathing: Simulated;+1 Total assistance Where Assessed - Lower Body Bathing: Sit to stand from chair Upper Body Dressing: Performed;Moderate assistance Upper Body Dressing Details (indicate cue type and reason): with donning gown Where Assessed - Upper Body Dressing: Sitting, bed Lower Body Dressing: Simulated;+1 Total assistance Where Assessed - Lower Body Dressing:  Sit to stand from bed Toilet Transfer: Performed;+1 Total assistance;Comment for patient % (pt=>20%) Toilet Transfer Details (indicate cue type and reason): Max verbal cues for TDWB and hand placement for safe transfer technique.  Toilet Transfer Method: Ambulance person: Set designer - Clothing Manipulation: Performed;Maximal assistance Toileting - Clothing Manipulation Details (indicate cue type and reason): With moving gown Where Assessed - Toileting Clothing Manipulation: Lean right and/or left Toileting -  Hygiene: Performed;+1 Total assistance Toileting - Hygiene Details (indicate cue type and reason): Pt. with bilateral UE use of arm rests to keep sitting upright due to posterior lean Where Assessed - Toileting Hygiene: Sit on 3-in-1 or toilet Tub/Shower Transfer: Not assessed Tub/Shower Transfer Method: Not assessed ADL Comments: Pt. incontinent of urine upon arrival and requesting to go to the bathroom. Pt. provided assist to bedside commode with max verbal cues for forward weightshift due to posterior lean and pt. with increased confusion throughout session requiring constant redirection of attention to task.  Cognition: Cognition Arousal/Alertness: Awake/alert Orientation Level: Oriented X4 Cognition Arousal/Alertness: Awake/alert Overall Cognitive Status: Impaired Attention: Appears overall intact for tasks assessed Current Attention Level: Sustained Attention - Other Comments: Sustained throughout treatment. Orientation Level: Oriented X4 Following Commands: Follows one step commands inconsistently Cognition - Other Comments: Slow to process information.  Blood pressure 111/70, pulse 71, temperature 97.8 F (36.6 C), temperature source Oral, resp. rate 18, height 5' (1.524 m), weight 39.8 kg (87 lb 11.9 oz), SpO2 100.00%. Physical Exam  Constitutional: She is oriented to person, place, and time. She appears well-developed.  HENT:       #4 trach with PMV. Poor phonation even with PMV  Eyes: Pupils are equal, round, and reactive to light.  Neck:       Trach in place  Cardiovascular: Normal rate and regular rhythm.   Pulmonary/Chest: Effort normal and breath sounds normal.  Abdominal: Soft. Bowel sounds are normal.  Musculoskeletal:       Frail, muscle wasting. R femur incisions noted distally.  Arthritic changes in left greater than right hands.  Neurological: She is alert and oriented to person, place, and time.       Dysphonic speech.  ?HOH.  Fair awareness.  Some delays in  processing even with simple commands. Strength grossly 3-4/5 UE's. LES grossly 2/5 proximally to 3/5 distally.  No sensory deficits found. DTR's 1+  Skin: Skin is warm and dry.    Results for orders placed during the hospital encounter of 01/01/11 (from the past 24 hour(s))  CBC     Status: Abnormal   Collection Time   02/03/11  5:30 AM      Component Value Range   WBC 10.1  4.0 - 10.5 (K/uL)   RBC 4.03  3.87 - 5.11 (MIL/uL)   Hemoglobin 10.8 (*) 12.0 - 15.0 (g/dL)   HCT 86.5 (*) 78.4 - 46.0 (%)   MCV 82.4  78.0 - 100.0 (fL)   MCH 26.8  26.0 - 34.0 (pg)   MCHC 32.5  30.0 - 36.0 (g/dL)   RDW 69.6 (*) 29.5 - 15.5 (%)   Platelets 286  150 - 400 (K/uL)  PROTIME-INR     Status: Abnormal   Collection Time   02/03/11  5:30 AM      Component Value Range   Prothrombin Time 27.9 (*) 11.6 - 15.2 (seconds)   INR 2.56 (*) 0.00 - 1.49    Dg Chest Port 1 View  02/03/2011  *RADIOLOGY REPORT*  Clinical Data:  Pneumonia, shortness of breath  PORTABLE CHEST - 1 VIEW  Comparison: 01/25/2011; 01/23/2011; 01/22/2011  Findings: Grossly unchanged cardiac silhouette and mediastinal contours.  Interval removal of right upper extremity approach PICC line.  Otherwise, stable positioning of support apparatus.  Grossly unchanged right mid and lower lung heterogeneous opacities.  No new focal airspace opacities.  Query unchanged trace right-sided pleural effusion.  No definite left-sided pleural effusion.  No pneumothorax.  Unchanged bones.  IMPRESSION: Grossly unchanged right mid and lower lung heterogeneous opacities, possibly atelectasis of infection or excluded.  Original Report Authenticated By: Waynard Reeds, M.D.    Assessment/Plan: Diagnosis: distal right femur fracture s/p ORIF, vent dependent respiratory failure and cardiac arrest with significant deconditioning 1. Does the need for close, 24 hr/day medical supervision in concert with the patient's rehab needs make it unreasonable for this patient to be served  in a less intensive setting? Yes 2. Co-Morbidities requiring supervision/potential complications: afib, htn, hypnonatremia, hx MI 3. Due to bladder management, bowel management, safety, skin/wound care, disease management, medication administration, pain management and patient education, does the patient require 24 hr/day rehab nursing? Yes 4. Does the patient require coordinated care of a physician, rehab nurse, PT (1-2 hrs/day, 5 days/week), OT (1-2 hrs/day, 5 days/week) and SLP (1-2 hrs/day, 5 days/week) to address physical and functional deficits in the context of the above medical diagnosis(es)? Yes Addressing deficits in the following areas: balance, bathing, bowel/bladder control, cognition, dressing, endurance, feeding, grooming, locomotion, psychosocial adjustment, speech, strength, swallowing, toileting and transferring 5. Can the patient actively participate in an intensive therapy program of at least 3 hrs of therapy per day at least 5 days per week? Potentially 6. The potential for patient to make measurable gains while on inpatient rehab is excellent 7. Anticipated functional outcomes upon discharge from inpatients are supervision to minimal assist PT, supervision to minimal assist OT, supervision to mod I with SLP 8. Estimated rehab length of stay to reach the above functional goals is: 3 weeks 9. Does the patient have adequate social supports to accommodate these discharge functional goals? Yes and Potentially 10. Anticipated D/C setting: Home 11. Anticipated post D/C treatments: HH therapy 12. Overall Rehab/Functional Prognosis: excellent  RECOMMENDATIONS: This patient's condition is appropriate for continued rehabilitative care in the following setting: CIR Patient has agreed to participate in recommended program. Yes Note that insurance prior authorization may be required for reimbursement for recommended care.  Comment: Spoke with family member and discussed the fact that she  will need 24 hour supervision after a potential inpatient rehab stay.  Rehab RN to f/u.    ZTS 02/03/2011

## 2011-02-03 NOTE — Progress Notes (Signed)
Speech Pathology  PMSV (Passy-Muir Speaking Valve) Treatment Note  I. Tracheostomy Tube  Trach collar period: 24 hours per day Type: shiley    Size: 4.0     FiO2: 28% FiO2  Cuff: no Fenestration:  no Secretion description: mild, thin, yellow Frequency of tracheal suctioning: minimal Level of secretion expectoration: tracheal  II. Cuff Deflation Trials : n/a (cuffless trach)  IV. PMSV Trial PMSV was placed for 20 minutes. Able to redirect subglottic air through upper airway: yes Able to attain phonation with PMSV: yes Able to expectorate secretions with PMSV: not observed Breath Support for phonation: moderately decreased Intelligibiity: 40% RR: WNL SpO2: 94-97% HR:70s Behavior: alert  V. Clinical Impression:  Patient seen for PMSV trials facilitated by SLP s/p trach downsize to 4.0 cuffless shiley. Patient able to tolerate PMSV in place for 20 minutes while maintaining all vital signs and without evidence of CO2 trapping or drop in oxygen level. SLP provided moderate-max verbal cueing to facilitate redirection of air through upper airway in order to achieve phonation which was achieved in 100% of trials.  Orally articulating words without attempts at direction of air has become habitual for patient given length of time with inability to phonate. Vocal quality with decreased intensity and mild hoarse quality. Family present and does endorse that patient is "soft spoken" at baseline. Overall, patient with great toleration of PMSV. Valve left in place at end of session. Patient and RN educated regarding use for all waking hours, removal during sleep; both verbalized understanding however patient likely to require more education given fluctuating mentation.   VII. Recommendations 1. Patient to use PMSV during all waking hours as tolerated, removed during sleep 2. Will proceed with MBS in am 1/8 to evaluate swallowing function.   VIII. Treatment Plan  Short Term Goals 1. Patient will  redirect subglottic air through upper airway in order to phonate for 3-5 seconds with maximum verbal/positional support/cues-met 2. Patient will expectorate secretions via trach with cuff deflated with no tracheal suctioning-progressing  3. Patient will maintain VS without desaturation or Co2 trapping with PMSV in place for 60 seconds with verbal cues to fully exhale through upper airway-met  New Short term goals: 1. Patient will redirect subglottic air through upper airway to achieve phonation in 100% of attempts with minimal clinician verbal cues 2. Patient will expectorate secretions orally with PMSV in place with minimal positional, support cues. 3. Patient will maintain VS without desaturation or Co2 trapping with PMSV in place for all waking hours.  Pain: no  Ferdinand Lango MA, CCC-SLP [phone]

## 2011-02-03 NOTE — Progress Notes (Signed)
Patient up to chair with two person assist. Patient extremely weak but stayed in chair for approximately 2 hours.  Upon taking patient back to bed from chair, patient unable to help any with transfer. Nickolas Madrid

## 2011-02-03 NOTE — Progress Notes (Signed)
Name: [name] MRN: [id number] DOB: August 12, 1945    LOS: 33  PCCM PROGRESS NOTE  History of Present Illness: 66 y/o BF with HTN suffered fall on 11/27  and LE pain> [facility] dx R femoral DVT and started on anticoagulation. Pt had persistent pain and she had further imaging studies which showed R femoral fracture. Pt was transferred to [facility] for further management and was taken to the OR on 12/07> ORIF  right femur fracture. Post op suffered episode of Torsades and underwent 7-10 mins CPR. Subsequently developed AFRVR and hypotension. EKG demonstrated ST elevation on the inferior leads. Underwent cardiac cath 12/7. PCCM asked to assist with vent/CCM issues.  Extubated 01/10/11 and transferred out of 2300 12/18.  However on 12/20 developed acute onset shortness of breath, likely due to aspiration, re-intubated/ then trached, weaned and tx to floor 1/2  Lines / Drains: 12/07 L IJ CVL>>> 12/14 12/07 ETT>>> 12/10, 12/11 >>> 12/14 ,  12/21 ETT >> 12/21 12/14 PICC >>> 01/28/11 12/21 perc trach (DF/KZ) >>  Cultures: 12/10 UC>>>neg 12/10 RC>>>neg 12/20 MRSA  Screen > neg 12/21 Sputum> neg 12/21 blood> neg x 2 12/27 Sputum> few candida only  Antibiotics: 12/10 Vancomycin >>> 12/11  12/10 Ceftazidime (fever, purulent sputum, cloudy urine) >>> 12/18 12/21 vanc (HCAP vs. Asp PNA) >>12/29 12/21 Zosyn (HCAP vs. Asp PNA) >> 1/1 12/21 Cipro (HCAP vs. 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 Severe left ventricular dysfunction with inferior apical and distal anterior hypokinesis of severe degree. The estimated ejection fraction is 25%.  12/08 CT head: No acute abnormalities  12/08 Echo: Systolic function was moderately to severely reduced. The  estimated ejection fraction was in the range of 30% to 35%. There is akinesis of the apical myocardium. There is akinesis of the inferoposterior myocardium. There is hypokinesis of the lateral myocardium  12/11 Reintubated for sudden onset resp distress  12/11 CTA chest: No evidence of pulmonary embolus. Moderate bilateral effusions with significant consolidation and atelectasis in both lungs. Perihilar infiltration suggesting edema. Indeterminate mass or fluid collection in the right supraclavicular region measuring about 3 x 2.2 cm.  12/14 Extubated 12/20 Acute resp distress, transfer to 3300 12/21 Intubated for aspiration pneumonia 12/28 Transferred from SDU to ICU for trach site bleeding 12/31 Transferred to SDU. Failed FEES 01/28/11   Warfarin resumed 1/7 Downsize to #4, pending swallow eval  Overnight:   No acute events, wanting to eat if possible.   Vital Signs: Temp:  [97.4 F (36.3 C)-99.4 F (37.4 C)] 97.8 F (36.6 C) (01/07 0600) Pulse Rate:  [69-80] 76  (01/07 0740) Resp:  [14-20] 16  (01/07 0740) BP: (88-111)/(62-72) 111/70 mmHg (01/07 0600) SpO2:  [97 %-100 %] 100 % (01/07 0740) FiO2 (%):  [28 %] 28 % (01/07 0740) Weight:  [87 lb 11.9 oz (39.8 kg)] 87 lb 11.9 oz (39.8 kg) (01/07 0600)   Intake/Output Summary (Last 24 hours) at 02/03/11 1113 Last data filed at 02/03/11 0700  Gross per 24 hour  Intake    800 ml  Output      0 ml  Net    800 ml    Physical Examination: Gen: Chronically ill appearing, NAD. Passive, seems withdrawn HEENT: NCAT, PERRL, Trach site  clean PULM: resp's even/non-labored on 28% ATC, lungs bilaterally coarse CV: RRR, Nl S1/S2, -M/R/G. AB: BS+, soft, nontender, tol TF per nasal panda Ext: warm, no edema, no clubbing, no cyanosis; R PICC site dressing c/d/i Derm: no rash or skin breakdown Neuro: AAOx4, communicates appropriately   LABS  Lab 02/02/11 1013 02/01/11 0800 01/30/11 0522  NA 133* 137 146*  K 3.6 3.2* 3.4*  CL 96 100 110    CO2 26 24 26   BUN 23 21 22   CREATININE 0.58 0.64 0.79  GLUCOSE 100* 113* 124*    Lab 02/03/11 0530 02/02/11 1013 02/01/11 0800  HGB 10.8* 10.9* 12.2  HCT 33.2* 33.7* 37.6  WBC 10.1 9.9 9.7  PLT 286 298 313     Assessment and Plan:  Ischemic cardiomyopathy, MI, status post torsades cardiac arrest, atrial fibrillation with RVR, hypertension.  Status post bare metal stent placement. Cardiology following.   - Continue ASA, Plavix (d/c after 30 days per Cardiology-->started 12/8). - Continue Metoprolol, Lisinopril. - Continue Crestor. - Continue Amiodarone (for 6-8 weeks per Cardiology-->started 12/17).  Respiratory Failure: aspiration pneumonia vs. HCAP (WBC up, febrile), Patient had significant clots in the right mainstem that was removed bronchoscopically. - Maintain on TC. - Downsize to #4 Cuffless 1/7 (did not get done on 1/4) Watching need for suctioning. If can't progress wean first of week, then eval for PM speech valve - Would like to work towards decannulation but all care providers should keep in mind that she suffered multiple episodes of poorly explained sudden onset respiratory events that required intubation - CXR 1/7   Hypokalemia / Hypernatremia  Lab 02/02/11 1013 02/01/11 0800 01/30/11 0522 01/28/11 0500  K 3.6 3.2* 3.4* 3.2*  - Increased spironolactone to BID 1/1 - BMET 1/3> trending up on spirolactone so no rx - d/c free water 1/7-->f/u sodium 1/8 (on lasix & spironolactone)  Right Lower Extremity DVT  - Heparin and coumadin held due to trach site bleeding 12/28 episode. No further bleeding.   - Resumed warfarin 1/1   Anemia, bleeding from trach site on plavix, heparin, ASA  Lab 02/03/11 0530 02/02/11 1013 02/01/11 0800 01/31/11 0605 01/30/11 0522  HCT 33.2* 33.7* 37.6 33.7* 34.5*  - PRBC for Hgb < 7.0.  Malnutrition, dysphagia.  Swallowing assessment completed. Severe dysphagia on FEES 12/31. Panda tube replaced and back on TFs @ goal -will need  repeat swallow eval after #4 trach placed -Change trach to #4, if can swallow then progress toward SNF, if not, will need PEG, then SNF  Encephalopathy / delirium -Resolved   DISPO ? If will need short term rehab.  Currently, needs to be downsized and pass swallow eval.  If does not clear swallow, will need PEG to facilitate PEG placement   Canary Brim, NP-C [facility] Pgr: [phone]  Examined pt, agree with above care plan  [facility].

## 2011-02-03 NOTE — Progress Notes (Signed)
   CARE MANAGEMENT NOTE 02/03/2011  Patient:  [name], [name]   Account Number:  [id number]  Date Initiated:  01/02/2011  Documentation initiated by:  Junius Creamer  Subjective/Objective Assessment:   adm w femur fx     Action/Plan:   from nsg facility   Anticipated DC Date:  02/07/2011   Anticipated DC Plan:  SKILLED NURSING FACILITY  In-house referral  Clinical Social Worker      DC Planning Services  CM consult      Choice offered to / List presented to:             Status of service:  In process, will continue to follow Medicare Important Message given?   (If response is "NO", the following Medicare IM given date fields will be blank) Date Medicare IM given:   Date Additional Medicare IM given:    Discharge Disposition:  SKILLED NURSING FACILITY  Per UR Regulation:  Reviewed for med. necessity/level of care/duration of stay  Comments:  02/02/10 [name],RN,[name] 1200 PT CONTINUES ON TUBE FEEDS PER NASAL PANDA TUBE.  TO DOWNSIZE TRACH TO #4 TODAY; HOPEFUL THAT PT WILL BE ABLE TO SWALLOW AFTER THIS.  IF NOT, PT WILL NEED PEG PLACED FOR FEEDINGS, THEN PROCEED TO SNF FOR REHAB.  WILL FOLLOW PROGRESS. Phone #[phone]   02-03-11 8:30am [name], [name] - [phone] UR completed.  01-30-11- 6:50am [name], [name] [phone] UR completed. Failed swallowing- panda placed - on TF's. Will need PEG for SNF placement.  01-27-11 2pm [name], [name] [phone] UR completed. - continues on trach collar.  01-24-11 10:15pm [name], [name] [phone] UR completed.  01-20-11 1:30pm [name] - [phone] UR Completed.  01-17-11 10:45am [name], [name] [phone] UR Completed.  Reintubated -resp distress - possible aspiration.  01/14/11 [name],RN,[name] PT ADM FROM SNF WITH RT FEMUR FRACTURE AND DVT.  CSW FOLLOWING, WORKING WITH DAUGHTER TO FACILITATE DC TO SNF WHEN MEDICALLY STABLE FOR DISCHARGE. Phone  #[phone]    01-13-11 2pm [name], [name] - [phone] UR completed - now off vent again and off bipap as of this am.  Still on IV heparin, lasix,and antibiotics.   01-07-11 11:10am [name], [name] [phone] UR Completed -- post surgery - coded - in ICU on vent on 2011-01-29 - now extubated.  12/6 sw ref. debbie dowell rn,bsn [id number]

## 2011-02-04 ENCOUNTER — Inpatient Hospital Stay (HOSPITAL_COMMUNITY): Payer: Medicare Other

## 2011-02-04 DIAGNOSIS — I469 Cardiac arrest, cause unspecified: Secondary | ICD-10-CM

## 2011-02-04 DIAGNOSIS — R5381 Other malaise: Secondary | ICD-10-CM

## 2011-02-04 DIAGNOSIS — R1310 Dysphagia, unspecified: Secondary | ICD-10-CM

## 2011-02-04 DIAGNOSIS — S72409A Unspecified fracture of lower end of unspecified femur, initial encounter for closed fracture: Secondary | ICD-10-CM

## 2011-02-04 DIAGNOSIS — J9600 Acute respiratory failure, unspecified whether with hypoxia or hypercapnia: Secondary | ICD-10-CM

## 2011-02-04 DIAGNOSIS — W108XXA Fall (on) (from) other stairs and steps, initial encounter: Secondary | ICD-10-CM

## 2011-02-04 DIAGNOSIS — Z930 Tracheostomy status: Secondary | ICD-10-CM

## 2011-02-04 LAB — BASIC METABOLIC PANEL
CO2: 30 mEq/L (ref 19–32)
Calcium: 9.3 mg/dL (ref 8.4–10.5)
Creatinine, Ser: 0.63 mg/dL (ref 0.50–1.10)
GFR calc non Af Amer: >90 mL/min (ref >90)
Sodium: 136 mEq/L (ref 135–145)

## 2011-02-04 MED ORDER — FUROSEMIDE 40 MG PO TABS
40.0000 mg | ORAL_TABLET | Freq: Every day | ORAL | Status: DC
  Administered 2011-02-04 – 2011-02-07 (×3): 40 mg via ORAL
  Filled 2011-02-04 (×4): qty 1

## 2011-02-04 MED ORDER — LISINOPRIL 2.5 MG PO TABS
2.5000 mg | ORAL_TABLET | Freq: Two times a day (BID) | ORAL | Status: DC
  Administered 2011-02-04 (×2): 2.5 mg via ORAL
  Filled 2011-02-04 (×4): qty 1

## 2011-02-04 MED ORDER — ROSUVASTATIN CALCIUM 10 MG PO TABS
10.0000 mg | ORAL_TABLET | Freq: Every day | ORAL | Status: DC
  Administered 2011-02-04 – 2011-02-06 (×3): 10 mg via ORAL
  Filled 2011-02-04 (×4): qty 1

## 2011-02-04 MED ORDER — SPIRONOLACTONE 25 MG PO TABS: 25.0000 mg | ORAL_TABLET | Freq: Two times a day (BID) | ORAL | Status: DC

## 2011-02-04 MED ORDER — STARCH (THICKENING) PO POWD
ORAL | Status: DC | PRN
  Filled 2011-02-04: qty 227

## 2011-02-04 MED ORDER — PRO-STAT SUGAR FREE PO LIQD
30.0000 mL | Freq: Two times a day (BID) | ORAL | Status: DC
  Administered 2011-02-04 – 2011-02-07 (×7): 30 mL via ORAL
  Filled 2011-02-04 (×6): qty 30

## 2011-02-04 MED ORDER — SPIRONOLACTONE 25 MG PO TABS
25.0000 mg | ORAL_TABLET | Freq: Two times a day (BID) | ORAL | Status: DC
  Administered 2011-02-04: 25 mg via ORAL
  Filled 2011-02-04 (×3): qty 1

## 2011-02-04 NOTE — Progress Notes (Signed)
I met with patient and her twin at bedside. PTA patient was independent and working, living alone. Was at SNF for rehab for 2 days and readmitted to [facility]. I contacted patient's daughter, Aline Brochure, and she is confirming with another Elson Clan, that patient can discharge to her home once rehab completed. I will begin pre certification with [employer] medicare for a possible admit to CIR if family confirms 24/7 assist once discharged. I will follow up in the a.m.Please call for any questions. Pager [phone].

## 2011-02-04 NOTE — Progress Notes (Signed)
Physical Therapy Note:pt out of room for procedure currently. Will attempt later as time allows. Toney Sang, PT [phone]

## 2011-02-04 NOTE — Procedures (Signed)
Modified Barium Swallow Procedure Note Patient Details  Name: [name] MRN: [id number] Date of Birth: 1945-05-14  Today's Date: 02/04/2011 Time:  -     Past Medical History:  Past Medical History  Diagnosis Date  . Hypertension   . 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 Vitamin B12 deficiency (dietary) anemia 12/27/2010  . Arthritis   . MI (myocardial infarction) 01/09/2011  . Aspiration pneumonia 01/17/2011  . VT (ventricular tachycardia) 01/28/2011   Past Surgical History:  Past Surgical History  Procedure Date  . Abdominal hysterectomy     06/2010  . Femur im nail 01/03/2011    Procedure: INTRAMEDULLARY (IM) RETROGRADE FEMORAL NAILING;  Surgeon: Budd Palmer;  Location: [facility] OR;  Service: Orthopedics;  Laterality: Right;  Right IM Retrograde Femoral Nailing   HPI:     Recommendation/Prognosis  Clinical Impression Dysphagia Diagnosis: Mild oral phase dysphagia;Moderate pharyngeal phase dysphagia (secondary esophageal component) Clinical impression: Patient presents with a mild-moderate sensory-motor based oropharyngeal dysphagia with s secondary eosphageal component. Oropharyngeal swallow characterized by delayed swallow initiation, decreased base of tongue strength and laryngeal closure resulting in silent penetration of liquids thinner than honey thick, prevented with a chin tuck with nectar thick liquids only. Additionally, chin tuck successful to significantly reduce or clear vallecular residuals. Patient continued to have silent penetration of thin liquids despite use of chin tick. Esophageal function characterized by presence of esophageal residuals throughout esophagus post swallow, and decreased clearance of pill through lower cervical esophagus resulting in backflow of subsequent boluses provided into the pyriform sinuses. Pill eventually cleared (> 10 minutes) with multiple sips of warm liquid. Question if potential esophageal dysfunction contributed to  suspected aspiration episode earlier in hospitalization resulting in VDRF. At this time, recommend initiation of conservative diet with strict adherence to aspiration precautions. Pending overall appearance at bedside (without Panda tube), may benefit from GI f/u to evaluate esophagus.  Swallow Recommendations Recommended Consults: Consider GI evaluation (possible, question if Panda tube contributing at this time) Solid Consistency: Dysphagia 2 (Fine chop) Liquid Consistency: Nectar Liquid Administration via: Cup;No straw Medication Administration: Crushed with puree Supervision: Full supervision/cueing for compensatory strategies Compensations: Slow rate;Small sips/bites Postural Changes and/or Swallow Maneuvers: Chin tuck;Seated upright 90 degrees;Upright 30-60 min after meal  Oral Care Recommendations: Oral care QID  Other Recommendations: Order thickener from pharmacy;Prohibited food (jello, ice cream, thin soups);Remove water pitcher;Place PMSV during PO intake  Follow up Recommendations: Inpatient Rehab  SLP Assessment/Plan  3 x/week for 2 weeks  SLP Goals   1. Patient will consume recommended diet with no s/s of aspiration and min assist. 2. Patient will utilize aspiration precautions and compensatory strategies with min assist.   General:  Date of Onset: 01/05/11 HPI: 66 y.o. female admitted post fall on 11/27, tranferred to [facility] on 12/7 to receive further management of right femoral fracture. Pt intubated twice secondary to respiratory distress and dx with HCPNA.  Initial bedside swallow eval completed 12/17  with recs to begin conservative diet secondary to dysphagia (dysphagia 3, nectar-thick liquids).  Pt with respiratory distress 12/20; intubated, then trached 01/17/11.  Passy-Muir Speaking Valve eval 12/24 with poor tolerance/C02  retention, however since trach has been changed to 4/0 cuffless shiley, patient tolerating PMSV during all waking hours. Previous FEES complete  indicated a severe oropharyngeal dysphagia. MBS now ordered to determine potential to resume a po diet given general improvement in overall status.  Type of Study: Initial MBS Diet Prior to this Study: NPO;Panda Temperature Spikes Noted: No  Respiratory Status: Trach Trach Size and Type: #4;Uncuffed;With PMSV in place History of Intubation: Yes Length of Intubations (days): 5 days Date extubated: 01/17/11 Behavior/Cognition: Alert;Cooperative Oral Cavity - Dentition: Poor condition Oral Motor / Sensory Function: Within functional limits Vision: Functional for self-feeding Patient Positioning: Upright in chair Baseline Vocal Quality: Hoarse;Breathy;Low vocal intensity (decreased breath support) Volitional Cough: Weak Volitional Swallow: Able to elicit Anatomy: Within functional limits Pharyngeal Secretions: Not observed secondary MBS Ice chips: Not tested  Oral Phase Oral Preparation/Oral Phase Oral Phase: WFL Oral Phase - Comment Oral Phase - Comment:  (A-P transit mildly prolongued with solids; missing teeth) Pharyngeal Phase  Pharyngeal Phase Pharyngeal Phase: Impaired Pharyngeal - Honey Pharyngeal - Honey Teaspoon: Delayed swallow initiation;Premature spillage to valleculae;Reduced tongue base retraction;Reduced airway/laryngeal closure;Pharyngeal residue - valleculae (moderate residuals cleared with chin tuck) Pharyngeal - Honey Cup: Premature spillage to valleculae;Delayed swallow initiation;Reduced airway/laryngeal closure;Reduced tongue base retraction;Pharyngeal residue - valleculae (moderate residuals cleared with chin tuck) Pharyngeal - Nectar Pharyngeal - Nectar Teaspoon: Premature spillage to pyriform;Delayed swallow initiation;Reduced airway/laryngeal closure;Reduced tongue base retraction;Penetration/Aspiration before swallow;Pharyngeal residue - valleculae (chin tuck prevented penetration and cleared residuals) Penetration/Aspiration details (nectar teaspoon): Material  enters airway, remains ABOVE vocal cords and not ejected out (cleared with cued cough) Pharyngeal - Nectar Cup: Delayed swallow initiation;Premature spillage to valleculae;Reduced airway/laryngeal closure;Reduced tongue base retraction;Pharyngeal residue - valleculae (with chin tuck, cleared residuals) Pharyngeal - Thin Pharyngeal - Thin Teaspoon: Not tested Pharyngeal - Thin Cup: Delayed swallow initiation;Premature spillage to pyriform;Reduced airway/laryngeal closure;Penetration/Aspiration before swallow;Reduced tongue base retraction;Pharyngeal residue - valleculae;Pharyngeal residue - pyriform Penetration/Aspiration details (thin cup): Material enters airway, CONTACTS cords and not ejected out (silent penetration not prevented with chin tuck) Pharyngeal - Solids Pharyngeal - Puree: Delayed swallow initiation;Premature spillage to valleculae;Reduced tongue base retraction;Pharyngeal residue - valleculae (chin tuck significantly decreases residuals) Pharyngeal - Mechanical Soft: Premature spillage to valleculae;Delayed swallow initiation;Reduced tongue base retraction;Pharyngeal residue - valleculae (chin tuck significantly decreases residuals) Pharyngeal - Pill: Premature spillage to valleculae;Delayed swallow initiation;Pharyngeal residue - valleculae (provided whole in puree; with chin tuck) Cervical Esophageal Phase  Cervical Esophageal Phase Cervical Esophageal Phase: Impaired Cervical Esophageal Phase - Honey Honey Teaspoon: Within functional limits Honey Cup: Within functional limits Cervical Esophageal Phase - Nectar Nectar Teaspoon: Within functional limits Nectar Cup: Within functional limits Cervical Esophageal Phase - Thin Thin Cup: Within functional limits Cervical Esophageal Phase - Solids Puree: Within functional limits Mechanical Soft: Within functional limits Pill: Esophageal backflow into cervical esophagus (pill remained in lower cervical esophagus > 10 minutes  ) Cervical Esophageal Phase - Comment Cervical Esophageal Comment: pill eventually cleared with many sips of warm liquid. Remaining pill resulting in backflow of subsequent bites and sips into pyriform sinuses.   Ferdinand Lango MA, CCC-SLP [phone]    Ferdinand Lango Meryl 02/04/2011, 10:48 AM

## 2011-02-04 NOTE — Progress Notes (Signed)
Name: [name] MRN: [id number] DOB: [birth year]    LOS: 34  PCCM PROGRESS NOTE  History of Present Illness: 66 y/o BF with HTN suffered fall on 11/27  and LE pain> [facility] dx R femoral DVT and started on anticoagulation. Pt had persistent pain and she had further imaging studies which showed R femoral fracture. Pt was transferred to [facility] for further management and was taken to the OR on 12/07> ORIF  right femur fracture. Post op suffered episode of Torsades and underwent 7-10 mins CPR. Subsequently developed AFRVR and hypotension. EKG demonstrated ST elevation on the inferior leads. Underwent cardiac cath 12/7. PCCM asked to assist with vent/CCM issues.  Extubated 01/10/11 and transferred out of 2300 12/18.  However on 12/20 developed acute onset shortness of breath, likely due to aspiration, re-intubated/ then trached, weaned and tx to floor 1/2  Lines / Drains: 12/07 L IJ CVL>>> 12/14 12/07 ETT>>> 12/10, 12/11 >>> 12/14 ,  12/21 ETT >> 12/21 12/14 PICC >>> 01/28/11 12/21 perc trach (DF/KZ) >>1/7 #4 cuffless>>>  Cultures: 12/10 UC>>>neg 12/10 RC>>>neg 12/20 MRSA  Screen > neg 12/21 Sputum> neg 12/21 blood> neg x 2 12/27 Sputum> few candida only  Antibiotics: 12/10 Vancomycin >>> 12/11  12/10 Ceftazidime (fever, purulent sputum, cloudy urine) >>> 12/18 12/21 vanc (HCAP vs. Asp PNA) >>12/29 12/21 Zosyn (HCAP vs. Asp PNA) >> 1/1 12/21 Cipro (HCAP vs. 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 Severe left ventricular dysfunction with inferior apical and distal anterior hypokinesis of severe degree. The estimated ejection fraction is 25%.  12/08 CT head: No acute abnormalities  12/08 Echo: Systolic function was moderately to severely  reduced. The estimated ejection fraction was in the range of 30% to 35%. There is akinesis of the apical myocardium. There is akinesis of the inferoposterior myocardium. There is hypokinesis of the lateral myocardium  12/11 Reintubated for sudden onset resp distress  12/11 CTA chest: No evidence of pulmonary embolus. Moderate bilateral effusions with significant consolidation and atelectasis in both lungs. Perihilar infiltration suggesting edema. Indeterminate mass or fluid collection in the right supraclavicular region measuring about 3 x 2.2 cm.  12/14 Extubated 12/20 Acute resp distress, transfer to 3300 12/21 Intubated for aspiration pneumonia 12/28 Transferred from SDU to ICU for trach site bleeding 12/31 Transferred to SDU. Failed FEES 01/28/11   Warfarin resumed 1/7 Downsize to #4, pending swallow eval 1/8 Swallow eval, passed  Overnight:   No acute events, passed swallow eval, ate lunch.   Vital Signs: Temp:  [97.4 F (36.3 C)-98.9 F (37.2 C)] 97.4 F (36.3 C) (01/08 1400) Pulse Rate:  [65-79] 65  (01/08 1400) Resp:  [16-24] 20  (01/08 1400) BP: (85-101)/(63-67) 85/63 mmHg (01/08 1400) SpO2:  [93 %-99 %] 93 % (01/08 1400) FiO2 (%):  [28 %] 28 % (01/08 1400) Weight:  [82 lb 9.6 oz (37.467 kg)] 82 lb 9.6 oz (37.467 kg) (01/08 0700)   Intake/Output Summary (Last 24 hours) at 02/04/11 1425 Last data filed at 02/04/11 1058  Gross per 24 hour  Intake    300 ml  Output    200 ml  Net    100 ml    Physical Examination: Gen: Chronically ill appearing, NAD. Passive, seems withdrawn HEENT:  NCAT, PERRL, Trach site clean, voice clear with PMV PULM: resp's even/non-labored on 28% ATC, lungs bilaterally coarse CV: RRR, Nl S1/S2, -M/R/G. AB: BS+, soft, nontender, tol TF per nasal panda Ext: warm, no edema, no clubbing, no cyanosis; R PICC site dressing c/d/i Derm: no rash or skin breakdown Neuro: AAOx4, communicates appropriately   LABS  Lab 02/04/11 0550 02/02/11 1013  02/01/11 0800  NA 136 133* 137  K 3.2* 3.6 3.2*  CL 96 96 100  CO2 30 26 24   BUN 27* 23 21  CREATININE 0.63 0.58 0.64  GLUCOSE 115* 100* 113*    Lab 02/03/11 0530 02/02/11 1013 02/01/11 0800  HGB 10.8* 10.9* 12.2  HCT 33.2* 33.7* 37.6  WBC 10.1 9.9 9.7  PLT 286 298 313     Assessment and Plan:  Ischemic cardiomyopathy, MI, status post torsades cardiac arrest, atrial fibrillation with RVR, hypertension.  Status post bare metal stent placement. Cardiology following.   - Continue ASA, Plavix (d/c after 30 days per Cardiology-->started 12/8). - Continue Metoprolol, Lisinopril. - Continue Crestor. - Continue Amiodarone (for 6-8 weeks per Cardiology-->started 12/17).  Respiratory Failure: aspiration pneumonia vs. HCAP (WBC up, febrile), Patient had significant clots in the right mainstem that was removed bronchoscopically. - Maintain on TC. - Downsize to #4 Cuffless 1/7 (did not get done on 1/4) - CXR in am 1/9  - Would like to work towards decannulation but all care providers should keep in mind that she suffered multiple episodes of poorly explained sudden onset respiratory events that required intubation    Hypokalemia / Hypernatremia  Lab 02/04/11 0550 02/02/11 1013 02/01/11 0800 01/30/11 0522  K 3.2* 3.6 3.2* 3.4*  - Increased spironolactone to BID 1/1 - BMET 1/3> trending up on spirolactone so no rx - d/c free water 1/7-->f/u sodium 1/8 (on lasix & spironolactone)  Right Lower Extremity DVT  - Heparin and coumadin held due to trach site bleeding 12/28 episode. No further bleeding.   - Resumed warfarin 1/1   Anemia, bleeding from trach site on plavix, heparin, ASA  Lab 02/03/11 0530 02/02/11 1013 02/01/11 0800 01/31/11 0605 01/30/11 0522  HCT 33.2* 33.7* 37.6 33.7* 34.5*  - PRBC for Hgb < 7.0.  Malnutrition, dysphagia.  Swallowing assessment completed. Severe dysphagia on FEES 12/31. Panda tube replaced and back on TFs @ goal -will need repeat swallow eval after  #4 trach placed -Change trach to #4, MBS 1/8-->D2 fine chopped diet with nectar thick liquids  Encephalopathy / delirium -Resolved   DISPO ? If will need short term rehab vs. SNF .  Currently being evaluated by CIR.    Canary Brim, NP-C [facility] Pgr: [phone]  Patient seen and examined, agree with above note.  I dictated the care and orders written for this patient under my direction.  Koren Bound, M.D. [phone]

## 2011-02-04 NOTE — Progress Notes (Addendum)
CSW left message for pt daughter. Will hold on d/c planning for SNF, anticipating d/c to CIR at this time. Will follow for backup plan to SNF, if needed.  Baxter Flattery, MSW [phone]

## 2011-02-04 NOTE — Progress Notes (Signed)
Physical Therapy Treatment Patient Details Name: Gavin Faivre MRN: [id number] DOB: 1945-04-02 Today's Date: 02/04/2011  PT Assessment/Plan  PT - Assessment/Plan Comments on Treatment Session: Pt with Rfemur fx and trach collar. Pt unable to maintain TDWB without physical assist to keep RLE off floor. Talked to Montez Morita, PA who stated pt will need to remain TDWB for 6 wks post op 01/03/11. Pt limited by ability to maintain weight bearing restriction and cognition PT Plan: Discharge plan remains appropriate;Frequency remains appropriate Follow Up Recommendations: Skilled nursing facility PT Goals  Acute Rehab PT Goals Pt will go Supine/Side to Sit: with min assist PT Goal: Supine/Side to Sit - Progress: Revised (modified due to lack of progress/goal met) PT Goal: Sit to Supine/Side - Progress: Progressing toward goal Pt will go Sit to Stand: with max assist PT Goal: Sit to Stand - Progress: Revised due to lack of progress Pt will go Stand to Sit: with max assist PT Goal: Stand to Sit - Progress: Revised due to lack of progress Pt will Transfer Bed to Chair/Chair to Bed: with max assist PT Transfer Goal: Bed to Chair/Chair to Bed - Progress: Revised (modified due to lack of progress/goal met) Pt will Ambulate: 1 - 15 feet;with max assist;with rolling walker PT Goal: Ambulate - Progress: Revised (modified due to lack of progress/goal met) Pt will Perform Home Exercise Program: with supervision, verbal cues required/provided PT Goal: Perform Home Exercise Program - Progress: Revised (modified due to lack of progress/goal met)  PT Treatment Precautions/Restrictions  Precautions Precautions: Fall Required Braces or Orthoses: No Restrictions Weight Bearing Restrictions: Yes RLE Weight Bearing: Touchdown weight bearing (to maintain for 6wks from 01/03/11 per Montez Morita, PA) Other Position/Activity Restrictions: Right LE ROM without limitations. Mobility (including Balance) Bed  Mobility Supine to Sit: HOB elevated (Comment degrees);3: Mod assist;With rails (HOB 25degrees) Supine to Sit Details (indicate cue type and reason): assist to scoot hips toward EOB and cueing to use LLE to assist RLE as needed Sitting - Scoot to Edge of Bed: 3: Mod assist Sitting - Scoot to Edge of Bed Details (indicate cue type and reason): assist with pad and at trunk to control balance. Total assist +2pt =10% to scoot back in chair with pad Transfers Sit to Stand: 1: +2 Total assist;Patient percentage (comment);From bed;From chair/3-in-1 (pt=40%) Sit to Stand Details (indicate cue type and reason): x4 trials with pt RLE on PT foot to maintain TDWB. Pt with flexed posture and requires support to maintain balance and support body weight on LLE Stand to Sit: 1: +2 Total assist;Patient percentage (comment);To chair/3-in-1 (pt =40%) Stand to Sit Details: assist to control descent Stand Pivot Transfers: 1: +2 Total assist;Patient percentage (comment) (pt=40%) Stand Pivot Transfer Details (indicate cue type and reason): pt RLE on PT foot throughout to maintain TDWB. Pt with flexed posture and unable to advance LLE without assist for pelvic rotation x 2 trials, bed to [facility] to recliner Ambulation/Gait Ambulation/Gait: No Stairs: No  Posture/Postural Control Posture/Postural Control: Postural limitations Postural Limitations: kyphotic posture secondary to weakness and neck flexion unable to maintain in extension without assist due to weakness Static Sitting Balance Static Sitting - Balance Support: Bilateral upper extremity supported Static Sitting - Level of Assistance: 4: Min assist Static Sitting - Comment/# of Minutes: EOB assist for balance and on BSC pt started sliding to R with assist for posture and position to maintain balance Exercise  General Exercises - Lower Extremity Long Arc Quad: AROM;Both;20 reps;Seated Hip ABduction/ADduction: AROM;20 reps;Both;Seated Hip Flexion/Marching:  AROM;Both;Other reps (comment);Seated (20reps) End of Session PT - End of Session Activity Tolerance: Patient tolerated treatment well Patient left: in chair;with call bell in reach Nurse Communication: Mobility status for transfers General Behavior During Session: [facility] for tasks performed Cognition: Impaired Cognitive Impairment: Pt unaware of date, situation, unable to recall events of the day or precautions. STM deficits, sustained attention.  Toney Sang Beth 02/04/2011, 3:56 PM Toney Sang, PT [phone]

## 2011-02-05 ENCOUNTER — Inpatient Hospital Stay (HOSPITAL_COMMUNITY): Payer: Medicare Other

## 2011-02-05 LAB — BASIC METABOLIC PANEL
BUN: 24 mg/dL — ABNORMAL HIGH (ref 6–23)
Calcium: 9.5 mg/dL (ref 8.4–10.5)
Chloride: 96 mEq/L (ref 96–112)
Creatinine, Ser: 0.69 mg/dL (ref 0.50–1.10)
GFR calc Af Amer: >90 mL/min (ref >90)

## 2011-02-05 LAB — PROTIME-INR
INR: 2.41 — ABNORMAL HIGH (ref 0.00–1.49)
Prothrombin Time: 26.6 seconds — ABNORMAL HIGH (ref 11.6–15.2)

## 2011-02-05 MED ORDER — ENSURE PUDDING PO PUDG
1.0000 | Freq: Three times a day (TID) | ORAL | Status: DC
  Administered 2011-02-06 – 2011-02-07 (×4): 1 via ORAL

## 2011-02-05 MED ORDER — LISINOPRIL 2.5 MG PO TABS
2.5000 mg | ORAL_TABLET | Freq: Two times a day (BID) | ORAL | Status: DC
  Filled 2011-02-05 (×3): qty 1

## 2011-02-05 MED ORDER — SPIRONOLACTONE 25 MG PO TABS
25.0000 mg | ORAL_TABLET | Freq: Two times a day (BID) | ORAL | Status: DC
  Administered 2011-02-06 – 2011-02-07 (×2): 25 mg via ORAL
  Filled 2011-02-05 (×5): qty 1

## 2011-02-05 NOTE — Progress Notes (Signed)
Will reinitiate SNF search. Baxter Flattery, MSW [phone]

## 2011-02-05 NOTE — Progress Notes (Signed)
Wanted md aware; patient is has not received any BP medications in last 48 hours due to lower bp. Pt asymptomatic and NP notified of lower bp. [name]

## 2011-02-05 NOTE — Progress Notes (Addendum)
SBP 90/52 Dr Vassie Loll made aware, new orders received.

## 2011-02-05 NOTE — Progress Notes (Signed)
Pt found decanulated upon arrival to pts room.  Trach reinserted.  Oxygen saturations remained good after reintroducing trach.

## 2011-02-05 NOTE — Progress Notes (Signed)
Name: [name] MRN: [id number] DOB: 08-Sep-1945    LOS: 35  PCCM PROGRESS NOTE  History of Present Illness: 66 y/o BF with HTN suffered fall on 11/27  and LE pain> APH dx R femoral DVT and started on anticoagulation. Pt had persistent pain and she had further imaging studies which showed R femoral fracture. Pt was transferred to [facility] for further management and was taken to the OR on 12/07> ORIF  right femur fracture. Post op suffered episode of Torsades and underwent 7-10 mins CPR. Subsequently developed AFRVR and hypotension. EKG demonstrated ST elevation on the inferior leads. Underwent cardiac cath 12/7. PCCM asked to assist with vent/CCM issues.  Extubated 01/10/11 and transferred out of 2300 12/18.  However on 12/20 developed acute onset shortness of breath, likely due to aspiration, re-intubated/ then trached, weaned and tx to floor 1/2  Lines / Drains: 12/07 L IJ CVL>>> 12/14 12/07 ETT>>> 12/10, 12/11 >>> 12/14 ,  12/21 ETT >> 12/21 12/14 PICC >>> 01/28/11 12/21 perc trach (DF/KZ) >>1/7 #4 cuffless>>>  Cultures: 12/10 UC>>>neg 12/10 RC>>>neg 12/20 MRSA  Screen > neg 12/21 Sputum> neg 12/21 blood> neg x 2 12/27 Sputum> few candida only  Antibiotics: 12/10 Vancomycin >>> 12/11  12/10 Ceftazidime (fever, purulent sputum, cloudy urine) >>> 12/18 12/21 vanc (HCAP vs. Asp PNA) >>12/29 12/21 Zosyn (HCAP vs. Asp PNA) >> 1/1 12/21 Cipro (HCAP vs. 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 Severe left ventricular dysfunction with inferior apical and distal anterior hypokinesis of severe degree. The estimated ejection fraction is 25%.  12/08 CT head: No acute abnormalities  12/08 Echo: Systolic function was moderately to severely  reduced. The estimated ejection fraction was in the range of 30% to 35%. There is akinesis of the apical myocardium. There is akinesis of the inferoposterior myocardium. There is hypokinesis of the lateral myocardium  12/11 Reintubated for sudden onset resp distress  12/11 CTA chest: No evidence of pulmonary embolus. Moderate bilateral effusions with significant consolidation and atelectasis in both lungs. Perihilar infiltration suggesting edema. Indeterminate mass or fluid collection in the right supraclavicular region measuring about 3 x 2.2 cm.  12/14 Extubated 12/20 Acute resp distress, transfer to 3300 12/21 Intubated for aspiration pneumonia 12/28 Transferred from SDU to ICU for trach site bleeding 12/31 Transferred to SDU. Failed FEES 01/28/11   Warfarin resumed 1/7 Downsize to #4, pending swallow eval 1/8 Swallow eval, passed  Overnight:   No acute events, awaiting insurance approval for inpt rehab.  Slightly soft BP this am - held morning dose lasix, aldactone  Vital Signs: Temp:  [97.4 F (36.3 C)-98.2 F (36.8 C)] 98.2 F (36.8 C) (01/09 0524) Pulse Rate:  [65-103] 75  (01/09 0859) Resp:  [16-20] 20  (01/09 0524) BP: (85-96)/(56-64) 96/64 mmHg (01/09 0859) SpO2:  [93 %-100 %] 95 % (01/09 1215) FiO2 (%):  [28 %] 28 % (01/09 1215)   Intake/Output Summary (Last 24 hours) at 02/05/11 1322 Last data filed at 02/05/11 0900  Gross per 24 hour  Intake    240 ml  Output    202 ml  Net     38 ml    Physical Examination: Gen: Chronically ill appearing, NAD. Passive, seems withdrawn HEENT: NCAT, PERRL, Trach  site clean, voice clear with PMV PULM: resp's even/non-labored on 28% ATC, lungs bilaterally coarse CV: RRR, Nl S1/S2, -M/R/G. AB: BS+, soft, nontender, tol TF per nasal panda Ext: warm, no edema, no clubbing, no cyanosis; R PICC site dressing c/d/i Derm: no rash or skin breakdown Neuro: AAOx4, communicates appropriately   LABS  Lab 02/05/11 0540 02/04/11 0550  02/02/11 1013  NA 136 136 133*  K 3.7 3.2* 3.6  CL 96 96 96  CO2 29 30 26   BUN 24* 27* 23  CREATININE 0.69 0.63 0.58  GLUCOSE 91 115* 100*    Lab 02/03/11 0530 02/02/11 1013 02/01/11 0800  HGB 10.8* 10.9* 12.2  HCT 33.2* 33.7* 37.6  WBC 10.1 9.9 9.7  PLT 286 298 313     Assessment and Plan:  Ischemic cardiomyopathy, MI, status post torsades cardiac arrest, atrial fibrillation with RVR, hypertension.  Status post bare metal stent placement. Cardiology following.   - Continue ASA, Plavix -- d/c plavix 1/9 per cards recommendation of 30 days - Continue Metoprolol, Lisinopril. - Continue Crestor. - Continue Amiodarone (for 6-8 weeks per Cardiology-->started 12/17).  Respiratory Failure: aspiration pneumonia vs. HCAP (WBC up, febrile), Patient had significant clots in the right mainstem that was removed bronchoscopically. - Maintain on TC. - Downsize to #4 Cuffless 1/7  - int f/u CXR   - Would like to work towards decannulation but all care providers should keep in mind that she suffered multiple episodes of poorly explained sudden onset respiratory events that required intubation -- would not consider decannulation until conditioning much improved.    Hypokalemia / Hypernatremia  Lab 02/05/11 0540 02/04/11 0550 02/02/11 1013 02/01/11 0800 01/30/11 0522  K 3.7 3.2* 3.6 3.2* 3.4*  - cont lasix, aldactone - F/u chem   Right Lower Extremity DVT  - Heparin and coumadin held due to trach site bleeding 12/28 and resumed 1/1.  -cont coumadin per pharmacy   Anemia, bleeding from trach site on plavix, heparin, ASA - resolved  Lab 02/03/11 0530 02/02/11 1013 02/01/11 0800 01/31/11 0605 01/30/11 0522  HCT 33.2* 33.7* 37.6 33.7* 34.5*  - PRBC for Hgb < 7.0.  Malnutrition, dysphagia.  Swallowing assessment completed. Severe dysphagia on FEES 12/31.  -Changed trach to #4 1/7, MBS 1/8-->D2 fine chopped diet with nectar thick liquids  DISPO ** pt's family has decided they are  unable to provide care for her at home after d/c from inpt rehab, therefor pt will need SNF placement for d/c.  Discussed with case mgmt/ SW who will proceed with SNF search.   Danford Bad, NP 02/05/2011  1:29 PM  *Care during the described time interval was provided by me and/or other providers on the critical care team. I have reviewed this patient's available data, including medical history, events of note, physical examination and test results as part of my evaluation.  Patient seen and examined, agree with above note.  I dictated the care and orders written for this patient under my direction.  Koren Bound, M.D.

## 2011-02-05 NOTE — Progress Notes (Signed)
Speech Pathology: Dysphagia Treatment Note  Patient was observed with : Mechanical Soft / Ground and Nectar liquids.  Patient was noted to have s/s of aspiration : No  Lung Sounds:  diminished Temperature: 99.4  Patient required: min cues to consistently follow precautions/strategies  Clinical Impression: F/u diet tolerance assessment revealed no overt s/s of aspiration with current diet. Patient able to recall and verbalize chin tuck strategy however required min verbal cueing for consistent use during po intake. PMSV in place without incidence, vocal quality and intensity improving. No evidence of CO2 trapping. Overall, patient appears to be tolerating diet.   Recommendations:  1. Continue current diet and plan.   Pain:   none Intervention Required:   No   Goals: Progressing  Ferdinand Lango MA, CCC-SLP [phone]

## 2011-02-05 NOTE — Progress Notes (Addendum)
CSW spoke with pt daughter who is trying to contact a family member (her sister) to discuss need for 24 hour care at time of d/c from CIR. Pt daughter is not able to provide this level of care for pt herself. Pt daughter wcb with findings and CSW will proceed as appropriate with plans for CIR vs SNF.  Baxter Flattery, MSW [phone]  Spoke with pt daughter and she is agreeable to CIR, states family (pt sister) can provide 24 hour care at time of d/c from CIR, assuming pt medical needs are manageable. CSW will follow, pending insurance auth.

## 2011-02-05 NOTE — Progress Notes (Addendum)
I am pursuing insurance approval for CIR. Currently not approved and have asked for appeal of denial. I have not discussed appeal with patient or daughter as I await final disposition. Please call with any questions. Pager [phone] Daughter now states family unable to provide assist 24/7 after d/c. Will need SNF.

## 2011-02-05 NOTE — Progress Notes (Signed)
   CARE MANAGEMENT NOTE 02/05/2011  Patient:  [name], [name]   Account Number:  [id number]  Date Initiated:  01/02/2011  Documentation initiated by:  [name]  Subjective/Objective Assessment:   adm w femur fx     Action/Plan:   from nsg facility   Anticipated DC Date:  02/07/2011   Anticipated DC Plan:  SKILLED NURSING FACILITY  In-house referral  Clinical Social Worker      DC Planning Services  CM consult      Choice offered to / List presented to:             Status of service:  In process, will continue to follow Medicare Important Message given?   (If response is "NO", the following Medicare IM given date fields will be blank) Date Medicare IM given:   Date Additional Medicare IM given:    Discharge Disposition:  SKILLED NURSING FACILITY  Per UR Regulation:  Reviewed for med. necessity/level of care/duration of stay  Comments:  02/05/11 Traci Atienza,RN,[name] 1343 PT EVALUATED FOR CIR ADMISSION--FAMILY INITIALLY STATED THAT THEY WOULD BE ABLE TO PROVIDE 24HR CARE AT DC FROM CIR, BUT NOW STATES THAT THEY ARE NOT WILLING TO TAKE THIS ON.  PT WILL NEED SKILLED NURSING FACILITY PLACEMENT AS ORIGINALLY EXPECTED.  CSW AWARE OF THIS AND WILL FACILITATE DC TO SNF WHEN MEDICALLY STABLE. Phone #[phone]   02/03/11 Traci Filippi,RN,[name] 1200 PT CONTINUES ON TUBE FEEDS PER NASAL PANDA TUBE.  TO DOWNSIZE TRACH TO #4 TODAY; HOPEFUL THAT PT WILL BE ABLE TO SWALLOW AFTER THIS.  IF NOT, PT WILL NEED PEG PLACED FOR FEEDINGS, THEN PROCEED TO SNF FOR REHAB.  WILL FOLLOW PROGRESS. Phone #[phone]   02-03-11 8:30am [name], [name] - [phone] UR completed.  01-30-11- 6:50am [name], [name] [phone] UR completed. Failed swallowing- panda placed - on TF's. Will need PEG for SNF placement.  01-27-11 2pm [name], [name] [phone] UR completed. - continues on trach collar.  01-24-11 10:15pm [name], [name] [phone] UR completed.  01-20-11 1:30pm  [name] - [phone] UR Completed.  01-17-11 10:45am [name], [name] [phone] UR Completed.  Reintubated -resp distress - possible aspiration.  01/14/11 Traci Smeltzer,RN,[name] PT ADM FROM SNF WITH RT FEMUR FRACTURE AND DVT.  CSW FOLLOWING, WORKING WITH DAUGHTER TO FACILITATE DC TO SNF WHEN MEDICALLY STABLE FOR DISCHARGE.   01-13-11 2pm [name], [name] [phone] UR completed - now off vent again and off bipap as of this am.  Still on IV heparin, lasix,and antibiotics.   01-07-11 11:10am [name], [name] [phone] UR Completed -- post surgery - coded - in ICU on vent on January 27, 2011 - now extubated.  12/6 sw ref. Traci dowell rn,bsn [id number]

## 2011-02-05 NOTE — Progress Notes (Signed)
Nutrition Follow-up  EN discontinued. S/p modified barium swallow procedure 1/8.  Diet Order:  Dysphagia 2, Nectar liquids. Prostat liquid protein 30 ml PO BID. PO intake poor at 25-30% per flowsheet records.  Meds: Scheduled Meds:   . amiodarone  200 mg Oral Daily  . antiseptic oral rinse  15 mL Mouth Rinse 6 X Daily  . carvedilol  9.375 mg Oral BID WC  . chlorhexidine  15 mL Mouth Rinse BID  . clopidogrel  75 mg Oral Q breakfast  . cyanocobalamin  1,000 mcg Subcutaneous Weekly  . feeding supplement  30 mL Oral BID  . furosemide  40 mg Oral Daily  . levalbuterol  0.63 mg Nebulization TID  . lisinopril  2.5 mg Oral BID  . rosuvastatin  10 mg Oral q1800  . sodium chloride  10 mL Intracatheter Q12H  . spironolactone  25 mg Oral BID  . warfarin  2.5 mg Oral q1800   Continuous Infusions:  PRN Meds:.sodium chloride, acetaminophen, bisacodyl, food thickener, levalbuterol, magnesium hydroxide, ondansetron (ZOFRAN) IV, ondansetron (ZOFRAN) IV, sodium chloride  Labs:  CMP     Component Value Date/Time   NA 136 02/05/2011 0540   K 3.7 02/05/2011 0540   CL 96 02/05/2011 0540   CO2 29 02/05/2011 0540   GLUCOSE 91 02/05/2011 0540   BUN 24* 02/05/2011 0540   CREATININE 0.69 02/05/2011 0540   CALCIUM 9.5 02/05/2011 0540   PROT 6.8 01/13/2011 0615   ALBUMIN 2.5* 01/13/2011 0615   AST 115* 01/13/2011 0615   ALT 297* 01/13/2011 0615   ALKPHOS 120* 01/13/2011 0615   BILITOT 1.2 01/13/2011 0615   GFRNONAA 88* 02/05/2011 0540   GFRAA >90 02/05/2011 0540     Intake/Output Summary (Last 24 hours) at 02/05/11 1214 Last data filed at 02/05/11 0900  Gross per 24 hour  Intake    240 ml  Output    202 ml  Net     38 ml    Weight Status:  37.4 kg (1/8) -- trending down  Nutrition Dx:  Inadequate Oral Intake, ongoing  Goal:  Meet >90% of estimated nutrition needs with PO diet and supplements, currently unmet Monitor: PO intake, labs, weight, I/O's  Intervention:    Add Ensure Pudding PO TID (170  kcals, 4 gm protein per 4 oz cup)  Continue Prostat liquid protein 30 ml PO BID  RD to follow for nutrition care plan   Alger Memos Pager #:  [phone]

## 2011-02-05 NOTE — Progress Notes (Signed)
Patient discussed at the Long Length of Stay Teryl Mcconaghy Weeks 02/05/2011  

## 2011-02-05 NOTE — Progress Notes (Signed)
Occupational Therapy Evaluation Patient Details Name: [name] MRN: [id number] DOB: Oct 04, 1945 Today's Date: 02/05/2011  Problem List:  Patient Active Problem List  Diagnoses  . HTN (hypertension)  . Femoral DVT (deep venous thrombosis)  . Anemia  . Hyponatremia  . Vitamin B12 deficiency (dietary) anemia  . Lupus anticoagulant positive  . Femur fracture, right  . Osteoporosis  . Cardiogenic shock  . MI (myocardial infarction)  . Respiratory failure  . Aspiration pneumonia  . Ischemic cardiomyopathy  . VT (ventricular tachycardia)  . Atrial fibrillation    Past Medical History:  Past Medical History  Diagnosis Date  . Hypertension   . 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 Vitamin B12 deficiency (dietary) anemia 12/27/2010  . Arthritis   . MI (myocardial infarction) 01/09/2011  . Aspiration pneumonia 01/17/2011  . VT (ventricular tachycardia) 01/28/2011   Past Surgical History:  Past Surgical History  Procedure Date  . Abdominal hysterectomy     06/2010  . Femur im nail 01/03/2011    Procedure: INTRAMEDULLARY (IM) RETROGRADE FEMORAL NAILING;  Surgeon: Budd Palmer;  Location: [facility] OR;  Service: Orthopedics;  Laterality: Right;  Right IM Retrograde Femoral Nailing    OT Assessment/Plan/Recommendation OT Assessment Clinical Impression Statement: Pt. admitted for femoral fx with ORIF on 12/07 and cardiac cath. Extubated 12/14 and re-intubated 12/20 then changed to trach. OT Recommendation/Assessment: Patient will need skilled OT in the acute care venue OT Problem List: Decreased strength;Decreased activity tolerance;Decreased safety awareness;Decreased knowledge of use of DME or AE;Decreased knowledge of precautions;Pain OT Therapy Diagnosis : Generalized weakness;Acute pain OT Plan OT Frequency: Min 1X/week OT Treatment/Interventions: Self-care/ADL training;Patient/family education;Balance training;Therapeutic activities OT Recommendation Follow Up  Recommendations: Skilled nursing facility Equipment Recommended: Defer to next venue Individuals Consulted Consulted and Agree with Results and Recommendations: Patient OT Goals Acute Rehab OT Goals OT Goal Formulation: With patient Time For Goal Achievement: 2 weeks ADL Goals Pt Will Perform Grooming: with set-up;with min assist;Standing at sink ADL Goal: Grooming - Progress: Not met Pt Will Perform Upper Body Bathing: with set-up;Unsupported;Sitting, edge of bed ADL Goal: Upper Body Bathing - Progress: Not met Pt Will Perform Upper Body Dressing: with set-up;Sitting, bed;Unsupported ADL Goal: Upper Body Dressing - Progress: Not met Pt Will Perform Lower Body Dressing: with mod assist;Sit to stand from bed ADL Goal: Lower Body Dressing - Progress: Not met Pt Will Transfer to Toilet: with min assist;Stand pivot transfer;3-in-1 ADL Goal: Toilet Transfer - Progress: Not met  OT Evaluation Precautions/Restrictions  Precautions Precautions: Fall Required Braces or Orthoses: No Restrictions Weight Bearing Restrictions: Yes RLE Weight Bearing: Touchdown weight bearing Other Position/Activity Restrictions: Right LE ROM without limitations. Prior Functioning Home Living Lives With: Alone Receives Help From: Family Type of Home: House Home Layout: Two level;Able to live on main level with bedroom/bathroom Alternate Level Stairs-Rails: Right Alternate Level Stairs-Number of Steps: full flight but doesn't have to go up stairs Home Access: Stairs to enter Entrance Stairs-Rails: Right Entrance Stairs-Number of Steps: 2 Bathroom Shower/Tub: Walk-in Contractor: Standard Prior Function Level of Independence: Independent with basic ADLs;Independent with homemaking with ambulation;Independent with gait;Independent with transfers Able to Take Stairs?: Yes Driving: Yes Vocation: Part time employment ADL ADL Eating/Feeding: Simulated;Set up Where Assessed -  Eating/Feeding: Edge of bed Grooming: Performed;Wash/dry face;Set up;Supervision/safety Where Assessed - Grooming: Sitting, bed Upper Body Bathing: Not assessed Lower Body Bathing: Not assessed Upper Body Dressing: Simulated;Minimal assistance Where Assessed - Upper Body Dressing: Sitting, bed Lower Body Dressing: +1 Total assistance;Simulated  Where Assessed - Lower Body Dressing: Sit to stand from bed Toilet Transfer: Not assessed (NT 2* +2 assist not available) ADL Comments: Patient easily fatigued. 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 unable to test digits secondary to contractures Mobility  Bed Mobility Supine to Sit: 4: Min assist;With rails;HOB elevated (Comment degrees) (20degrees) Supine to Sit Details (indicate cue type and reason): assist to move bilateral LE and to bring trunk upright Sitting - Scoot to Edge of Bed: 3: Mod assist Sitting - Scoot to Edge of Bed Details (indicate cue type and reason): Assist to reciprocally scoot hips to EOB with facilitation to shift weight left and right. Cues for sequence. End of Session OT - End of Session Activity Tolerance: Patient tolerated treatment well Patient left: in bed;with call bell in reach;Other (comment) General Behavior During Session: [facility] for tasks performed   Terrilyn Tyner 02/05/2011, 4:11 PM

## 2011-02-05 NOTE — Progress Notes (Signed)
ANTICOAGULATION CONSULT NOTE - Follow Up Consult  Pharmacy Consult for Coumadin Indication:  R femoral DVT (12/24/2010)  No Known Allergies   Patient Measurements: Height: 5' (152.4 cm) Weight: 82 lb 9.6 oz (37.467 kg) IBW/kg (Calculated) : 45.5   Vital Signs: Temp: 99.4 F (37.4 C) (01/09 1354) Temp src: Oral (01/09 1354) BP: 88/57 mmHg (01/09 1354) Pulse Rate: 77  (01/09 1354)  Labs:  Basename 02/05/11 0540 02/04/11 0550 02/03/11 0530  HGB -- -- 10.8*  HCT -- -- 33.2*  PLT -- -- 286  APTT -- -- --  LABPROT 26.6* -- 27.9*  INR 2.41* -- 2.56*  HEPARINUNFRC -- -- --  CREATININE 0.69 0.63 --  CKTOTAL -- -- --  CKMB -- -- --  TROPONINI -- -- --   Estimated Creatinine Clearance: 40.4 ml/min (by C-G formula based on Cr of 0.69).  Assessment:   INR remains therapeutic.   On Plavix 75 mg since BMS 12/8 - planned for 30 days.   Also on Amiodarone 200 mg daily - planned for 6-8 weeks, begun 12/17. Had been off Aspirin since 12/25. Currently on every other day PT/INR checks.  Goal of Therapy:  INR 2-3   Plan:     Will continue Coumadin 2.5 mg daily.    Next PT/INR on 1/11.    Stop Plavix soon?  Has been on Plavix for 30 days.    Then add EC Aspirin 81 mg daily?  Dennie Fetters Pager: [phone] 02/05/2011,2:30 PM

## 2011-02-06 DIAGNOSIS — R1310 Dysphagia, unspecified: Secondary | ICD-10-CM

## 2011-02-06 DIAGNOSIS — Z930 Tracheostomy status: Secondary | ICD-10-CM

## 2011-02-06 DIAGNOSIS — J9600 Acute respiratory failure, unspecified whether with hypoxia or hypercapnia: Secondary | ICD-10-CM

## 2011-02-06 DIAGNOSIS — I469 Cardiac arrest, cause unspecified: Secondary | ICD-10-CM

## 2011-02-06 LAB — BASIC METABOLIC PANEL
BUN: 16 mg/dL (ref 6–23)
Chloride: 96 mEq/L (ref 96–112)
Creatinine, Ser: 0.62 mg/dL (ref 0.50–1.10)
GFR calc Af Amer: >90 mL/min (ref >90)
GFR calc non Af Amer: >90 mL/min (ref >90)
Potassium: 3.6 mEq/L (ref 3.5–5.1)

## 2011-02-06 LAB — CBC
HCT: 33.5 % — ABNORMAL LOW (ref 36.0–46.0)
MCHC: 32.8 g/dL (ref 30.0–36.0)
MCV: 82.7 fL (ref 78.0–100.0)
Platelets: 271 10*3/uL (ref 150–400)
RDW: 17.4 % — ABNORMAL HIGH (ref 11.5–15.5)
WBC: 8.7 10*3/uL (ref 4.0–10.5)

## 2011-02-06 MED ORDER — CARVEDILOL 6.25 MG PO TABS
6.2500 mg | ORAL_TABLET | Freq: Two times a day (BID) | ORAL | Status: DC
  Administered 2011-02-07: 6.25 mg via ORAL
  Filled 2011-02-06 (×4): qty 1

## 2011-02-06 NOTE — Progress Notes (Signed)
CSW spoke with pt daughter and faxed updated FL2 to SNF facilities in [place] and [place] and awaiting bed offers. Will plan for d/c today or tomorrow.  Baxter Flattery, MSW [phone]

## 2011-02-06 NOTE — Discharge Summary (Signed)
Physician Discharge Summary  Patient ID: [name] MRN: [id number] DOB/AGE: [birth year] 68 y.o.  Admit date: 01/01/2011 Discharge date: 02/06/2011    Discharge Diagnoses:  Femur fracture, right HTN (hypertension) Femoral DVT (deep venous thrombosis) Hyponatremia Vitamin B12 deficiency (dietary) anemia Osteoporosis Cardiogenic shock / cardiopulmonary arrest MI (myocardial infarction) Respiratory failure Aspiration pneumonia Ischemic cardiomyopathy VT (ventricular tachycardia) Atrial fibrillation    Brief Summary: [name] is a [age] y.o. y/o AAF female with a PMH of HTN suffered fall on 11/27 with LE pain.  She presented to [facility] and work up demonstrated R femoral DVT and was started on anticoagulation. Patient had persistent lower pain and she had further imaging studies which showed R femoral fracture. She was transferred to [facility] for further management and was taken to the OR on 12/07 for ORIF of right femur fracture. Post op course was complicated by an episode of Torsades and underwent 7-10 mins CPR. She subsequently developed atrial fibrillation with RVR and hypotension. EKG demonstrated ST elevation on the inferior leads. 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 Severe left ventricular dysfunction with inferior apical and distal anterior hypokinesis of severe degree. With an estimated ejection fraction of 25%.  PCCM asked to assist with vent/CCM issues.  She was extubated on 12/10 and required re-intubation on 12/11 due to respiratory insufficiency.  CTA chest at that time had no evidence of pulmonary embolus. Moderate bilateral effusions with significant consolidation and atelectasis in both lungs.  She was then extubated 01/10/11 and transferred out of ICU 12/18. However on 12/20  developed acute onset shortness of breath, likely due to aspiration, and required re-intubation.  She underwent percutaneous tracheostomy on 01/17/11 and made slow progress towards liberation from mechanical ventilation.  Speech therapy was consulted and patient underwent FEE's on 12/31 and failed.  Feeding tube was reinserted and nutrition support provided via enteral feeds.  She had transient bleeding from tracheostomy site and coumadin was held with restart on 01/28/11.  She currently is on coumadin 2.5mg  daily and is recommended PT/INR 02/09/11 for follow-up / adjustment of coumadin and then once per week thereafter.  She is to continue on amiodarone 200 mg daily for 6-8 weeks (began on 12/17) and then discontinue.  She was transferred to the medical floor on 01/29/11.  PT/OT efforts were escalated.  She was downsized to #4 cuffless tracheostomy on 02/03/11 and underwent swallow evaluation by speech therapy on 1/8 with advancement to modified diet.  She has tolerated 28% aerosolized trach collar without difficulty- recommend to wean O2 to off if saturations remain > 90%.  She was evaluated and approved for inpatient rehab at [facility]. However, her family had concerns for their ability to provide care after discharge.  Decision was ultimately made to transition to skilled nursing facility for rehab efforts.    Lines / Drains:  12/07 L IJ CVL>>> 12/14  12/07 ETT>>> 12/10, 12/11 >>> 12/14  12/21 ETT >> 12/21  12/14 PICC >>> 01/28/11  12/21 perc trach (DF/KZ) >>1/7 #4 cuffless>>>   Cultures:  12/10 UC>>>neg  12/10 RC>>>neg  12/20 MRSA Screen > neg  12/21 Sputum> neg  12/21 blood> neg x 2  12/27 Sputum> few candida only   Antibiotics:  12/10 Vancomycin >>> 12/11  12/10 Ceftazidime (fever, purulent sputum, cloudy urine) >>> 12/18  12/21 vanc (HCAP vs. Asp  PNA) >>12/29  12/21 Zosyn (HCAP vs. Asp PNA) >> 1/1  12/21 Cipro (HCAP vs. Asp PNA) >> 12/26   Tests / Events:  12/07 ORIF R femur fx  11/28 LE  venous dopplers - Occlusive right femoropopliteal DVT  12/08 Cath - Severe 95% thrombotic lesion in the mid right coronary artery which was successfully stented with a bare metal stent, postdilated to greater than 3 mm in diameter. Severe left ventricular dysfunction with inferior apical and distal anterior hypokinesis of severe degree. The estimated ejection fraction is 25%.  12/08 CT head: No acute abnormalities  12/08 Echo: Systolic function was moderately to severely reduced. The estimated ejection fraction was in the range of 30% to 35%. There is akinesis of the apical myocardium. There is akinesis of the inferoposterior myocardium. There is hypokinesis of the lateral myocardium  12/11 Reintubated for sudden onset resp distress  12/11 CTA chest: No evidence of pulmonary embolus. Moderate bilateral effusions with significant consolidation and atelectasis in both lungs. Perihilar infiltration suggesting edema. Indeterminate mass or fluid collection in the right supraclavicular region measuring about 3 x 2.2 cm.  12/14 Extubated  12/20 Acute resp distress, transfer to 3300  12/21 Intubated for aspiration pneumonia  12/28 Transferred from SDU to ICU for trach site bleeding  12/31 Transferred to SDU. Failed FEES  01/28/11 Warfarin resumed  1/7 Downsize to #4, pending swallow eval  1/8 Swallow eval, passed      Discharge Exam: Gen: Chronically ill appearing, NAD. Passive, seems withdrawn  HEENT: NCAT, PERRL, Trach site clean, voice weak with PMV, #4 Trach midline c/d/i PULM: resp's even/non-labored on 28% ATC, lungs bilaterally coarse  CV: RRR, Nl S1/S2, -M/R/G.  AB: BS+, soft, nontender, tol TF per nasal panda  Ext: warm, no edema, no clubbing, no cyanosis; R PICC site dressing c/d/i  Derm: no rash or skin breakdown  Neuro: AAOx4, communicates appropriately     Discharge Labs BMET    Component Value Date/Time   NA 133* 02/06/2011 0610   K 3.6 02/06/2011 0610   CL 96 02/06/2011 0610     CO2 28 02/06/2011 0610   GLUCOSE 92 02/06/2011 0610   BUN 16 02/06/2011 0610   CREATININE 0.62 02/06/2011 0610   CALCIUM 9.3 02/06/2011 0610   GFRNONAA >90 02/06/2011 0610   GFRAA >90 02/06/2011 0610   Lab Results  Component Value Date   WBC 8.7 02/06/2011   HGB 11.0* 02/06/2011   HCT 33.5* 02/06/2011   MCV 82.7 02/06/2011   PLT 271 02/06/2011      DISCHARGE MEDICATIONS  [name], [name]  Home Medication Instructions [id number]   Printed on:02/07/11 1335  Medication Information                    cyanocobalamin (,VITAMIN B-12,) 1000 MCG/ML injection Inject 1 mL (1,000 mcg total) into the muscle once a week.           amiodarone (PACERONE) 200 MG tablet Take 1 tablet (200 mg total) by mouth daily.           carvedilol (COREG) 6.25 MG tablet Take 1 tablet (6.25 mg total) by mouth 2 (two) times daily with a meal.           feeding supplement (ENSURE) PUDG Take 1 Container by mouth 3 (three) times daily with meals.           feeding supplement (PRO-STAT SUGAR FREE 64) LIQD Take 30 mLs by mouth 2 (two) times daily at 10 AM  and 5 PM.           food thickener (THICK IT) POWD As needed for nectar thick liquids           furosemide (LASIX) 40 MG tablet Take 1 tablet (40 mg total) by mouth daily.           levalbuterol (XOPENEX) 0.63 MG/3ML nebulizer solution Take 3 mLs (0.63 mg total) by nebulization 3 (three) times daily.           levalbuterol (XOPENEX) 0.63 MG/3ML nebulizer solution Take 3 mLs (0.63 mg total) by nebulization every 3 (three) hours as needed for wheezing or shortness of breath.           rosuvastatin (CRESTOR) 10 MG tablet Take 1 tablet (10 mg total) by mouth daily at 6 PM.           spironolactone (ALDACTONE) 25 MG tablet Take 1 tablet (25 mg total) by mouth 2 (two) times daily.           warfarin (COUMADIN) 2.5 MG tablet Take 1 tablet (2.5 mg total) by mouth daily at 6 PM.           aspirin EC 81 MG tablet Take 1 tablet (81 mg total) by mouth daily.              Follow-up Information    Follow up with Leslye Peer., MD on 03/10/2011. (Appt at 1000 am, arrive at 0945)    Contact information:   [address] [employer] [place] [address] [phone]       Follow up with Leanna Sato. (Upon discharge from Nursing Facility)    Contact information:   [facility] [address] [place] [address] [phone]            Disposition: Intermediate Care Facility  Discharged Condition: [name] has met maximum benefit of inpatient care and is medically stable and cleared for discharge to [facility] for further rehab efforts.   Plan is to follow up in Pulmonary office for potential decannulation of tracheostomy as above.     PT / INR needed Monday 1/13 with adjustment.  She has been stable at current dose.  If remains, likely can go to once weekly.   Time spent on disposition:  Greater than 35 minutes.   Signed: Canary Brim, NP-C [facility] Pgr: [phone]  Patient seen and examined, agree with above note.  I dictated the care and orders written for this patient under my direction.  Koren Bound, M.D.

## 2011-02-06 NOTE — Progress Notes (Signed)
Trach care done patient tolerated well, cleaned cannula and replaced. New dressing applied, small aprox 2 cm area with scab noted on the left side of trach under dressing.

## 2011-02-06 NOTE — Progress Notes (Signed)
Name: [name] MRN: [id number] DOB: [birth year]    LOS: 36  PCCM PROGRESS NOTE  History of Present Illness: 66 y/o BF with HTN suffered fall on 11/27  and LE pain> APH dx R femoral DVT and started on anticoagulation. Pt had persistent pain and she had further imaging studies which showed R femoral fracture. Pt was transferred to [facility] for further management and was taken to the OR on 12/07> ORIF  right femur fracture. Post op suffered episode of Torsades and underwent 7-10 mins CPR. Subsequently developed AFRVR and hypotension. EKG demonstrated ST elevation on the inferior leads. Underwent cardiac cath 12/7. PCCM asked to assist with vent/CCM issues.  Extubated 01/10/11 and transferred out of 2300 12/18.  However on 12/20 developed acute onset shortness of breath, likely due to aspiration, re-intubated/ then trached, weaned and tx to floor 1/2  Lines / Drains: 12/07 L IJ CVL>>> 12/14 12/07 ETT>>> 12/10, 12/11 >>> 12/14 ,  12/21 ETT >> 12/21 12/14 PICC >>> 01/28/11 12/21 perc trach (DF/KZ) >>1/7 #4 cuffless>>>  Cultures: 12/10 UC>>>neg 12/10 RC>>>neg 12/20 MRSA  Screen > neg 12/21 Sputum> neg 12/21 blood> neg x 2 12/27 Sputum> few candida only  Antibiotics: 12/10 Vancomycin >>> 12/11  12/10 Ceftazidime (fever, purulent sputum, cloudy urine) >>> 12/18 12/21 vanc (HCAP vs. Asp PNA) >>12/29 12/21 Zosyn (HCAP vs. Asp PNA) >> 1/1 12/21 Cipro (HCAP vs. 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 Severe left ventricular dysfunction with inferior apical and distal anterior hypokinesis of severe degree. The estimated ejection fraction is 25%.  12/08 CT head: No acute abnormalities  12/08 Echo: Systolic function was moderately to severely  reduced. The estimated ejection fraction was in the range of 30% to 35%. There is akinesis of the apical myocardium. There is akinesis of the inferoposterior myocardium. There is hypokinesis of the lateral myocardium  12/11 Reintubated for sudden onset resp distress  12/11 CTA chest: No evidence of pulmonary embolus. Moderate bilateral effusions with significant consolidation and atelectasis in both lungs. Perihilar infiltration suggesting edema. Indeterminate mass or fluid collection in the right supraclavicular region measuring about 3 x 2.2 cm.  12/14 Extubated 12/20 Acute resp distress, transfer to 3300 12/21 Intubated for aspiration pneumonia 12/28 Transferred from SDU to ICU for trach site bleeding 12/31 Transferred to SDU. Failed FEES 01/28/11   Warfarin resumed 1/7 Downsize to #4, pending swallow eval 1/8 Swallow eval, passed  Overnight:   No issues overnight.  SW looking for SNF placement.  Pt tol pt/ot well.  BP remains soft.   Vital Signs: Temp:  [98 F (36.7 C)-99.4 F (37.4 C)] 98 F (36.7 C) (01/10 0617) Pulse Rate:  [77-82] 77  (01/10 0617) Resp:  [18-20] 18  (01/10 0617) BP: (88-93)/(52-63) 93/63 mmHg (01/10 0617) SpO2:  [28 %-99 %] 28 % (01/10 0858) FiO2 (%):  [28 %] 28 % (01/10 0617) Weight:  [76 lb 11.2 oz (34.791 kg)] 76 lb 11.2 oz (34.791 kg) (01/10 0617)   Intake/Output Summary (Last 24 hours) at 02/06/11 0904 Last data filed at 02/05/11 2100  Gross per 24 hour  Intake    360 ml  Output      4 ml  Net    356 ml  Physical Examination: Gen: Chronically ill appearing, NAD. Passive, seems withdrawn HEENT: NCAT, PERRL, Trach site clean, voice weak with PMV PULM: resp's even/non-labored on 28% ATC, lungs bilaterally coarse CV: RRR, Nl S1/S2, -M/R/G. AB: BS+, soft, nontender, tol TF per nasal panda Ext: warm, no edema, no clubbing, no cyanosis; R PICC site dressing c/d/i Derm: no rash or skin breakdown Neuro: AAOx4, communicates  appropriately   LABS  Lab 02/06/11 0610 02/05/11 0540 02/04/11 0550  NA 133* 136 136  K 3.6 3.7 3.2*  CL 96 96 96  CO2 28 29 30   BUN 16 24* 27*  CREATININE 0.62 0.69 0.63  GLUCOSE 92 91 115*   CBC    Component Value Date/Time   WBC 8.7 02/06/2011 0610   RBC 4.05 02/06/2011 0610   HGB 11.0* 02/06/2011 0610   HCT 33.5* 02/06/2011 0610   PLT 271 02/06/2011 0610   MCV 82.7 02/06/2011 0610   MCH 27.2 02/06/2011 0610   MCHC 32.8 02/06/2011 0610   RDW 17.4* 02/06/2011 0610   LYMPHSABS 1.9 01/13/2011 0615   MONOABS 0.9 01/13/2011 0615   EOSABS 0.0 01/13/2011 0615   BASOSABS 0.0 01/13/2011 0615     Assessment and Plan:  Ischemic cardiomyopathy, MI, status post torsades cardiac arrest, atrial fibrillation with RVR, hypertension.  Status post bare metal stent placement. Cardiology following.   - Continue ASA, Plavix -- d/c plavix 1/9 per cards recommendation of 30 days - Decrease coreg and d/c lisinopril 1/10 r/t soft BP - Continue Crestor. - Continue Amiodarone (for 6-8 weeks per Cardiology-->started 12/17).  Respiratory Failure: aspiration pneumonia vs. HCAP (WBC up, febrile), Patient had significant clots in the right mainstem that was removed bronchoscopically. - Maintain on TC. - Cont #4 Cuffless with PMV as tol  - int f/u CXR   - Would like to work towards decannulation but all care providers should keep in mind that she suffered multiple episodes of poorly explained sudden onset respiratory events that required intubation -- would not consider decannulation until conditioning much improved.    Hypokalemia / Hypernatremia  Lab 02/06/11 0610 02/05/11 0540 02/04/11 0550 02/02/11 1013  NA 133* 136 136 133*    Lab 02/06/11 0610 02/05/11 0540 02/04/11 0550 02/02/11 1013 02/01/11 0800  K 3.6 3.7 3.2* 3.6 3.2*  - cont lasix, aldactone - F/u chem   Right Lower Extremity DVT  - Heparin and coumadin held due to trach site bleeding 12/28 and resumed 1/1.  -cont coumadin per  pharmacy   Anemia, bleeding from trach site on plavix, heparin, ASA - resolved  Lab 02/06/11 0610 02/03/11 0530 02/02/11 1013 02/01/11 0800 01/31/11 0605  HCT 33.5* 33.2* 33.7* 37.6 33.7*  - PRBC for Hgb < 7.0.  Malnutrition, dysphagia.  Swallowing assessment completed. -Severe dysphagia on FEES 12/31.  -Changed trach to #4 1/7, MBS 1/8-->D2 fine chopped diet with nectar thick liquids  DISPO ** Awaiting SNF placement - SW with SNF search in progress.   Danford Bad, NP 02/06/2011  9:04 AM  *Care during the described time interval was provided by me and/or other providers on the critical care team. I have reviewed this patient's available data, including medical history, events of note, physical examination and test results as part of my evaluation.  Patient seen and examined, agree with above note.  I dictated the care and orders written for this patient under my direction.  Koren Bound, M.D. [phone]

## 2011-02-07 LAB — BASIC METABOLIC PANEL
BUN: 17 mg/dL (ref 6–23)
CO2: 26 mEq/L (ref 19–32)
Calcium: 9.2 mg/dL (ref 8.4–10.5)
Chloride: 100 mEq/L (ref 96–112)
Creatinine, Ser: 0.6 mg/dL (ref 0.50–1.10)
GFR calc Af Amer: >90 mL/min (ref >90)
GFR calc non Af Amer: >90 mL/min (ref >90)
Glucose, Bld: 85 mg/dL (ref 70–99)
Potassium: 4.3 mEq/L (ref 3.5–5.1)
Sodium: 134 mEq/L — ABNORMAL LOW (ref 135–145)

## 2011-02-07 LAB — PROTIME-INR
INR: 2.62 — ABNORMAL HIGH (ref 0.00–1.49)
Prothrombin Time: 28.4 seconds — ABNORMAL HIGH (ref 11.6–15.2)

## 2011-02-07 MED ORDER — LEVALBUTEROL HCL 0.63 MG/3ML IN NEBU: 0.6300 mg | INHALATION_SOLUTION | RESPIRATORY_TRACT | Status: AC | PRN

## 2011-02-07 MED ORDER — ASPIRIN EC 81 MG PO TBEC: 81.0000 mg | DELAYED_RELEASE_TABLET | Freq: Every day | ORAL | Status: DC

## 2011-02-07 MED ORDER — SPIRONOLACTONE 25 MG PO TABS: 25.0000 mg | ORAL_TABLET | Freq: Two times a day (BID) | ORAL | Status: AC

## 2011-02-07 MED ORDER — STARCH (THICKENING) PO POWD: ORAL | Status: DC

## 2011-02-07 MED ORDER — AMIODARONE HCL 200 MG PO TABS: 200.0000 mg | ORAL_TABLET | Freq: Every day | ORAL | Status: AC

## 2011-02-07 MED ORDER — PRO-STAT SUGAR FREE PO LIQD: 30.0000 mL | Freq: Two times a day (BID) | ORAL | Status: AC

## 2011-02-07 MED ORDER — FUROSEMIDE 40 MG PO TABS: 40.0000 mg | ORAL_TABLET | Freq: Every day | ORAL | Status: AC

## 2011-02-07 MED ORDER — LEVALBUTEROL HCL 0.63 MG/3ML IN NEBU: 0.6300 mg | INHALATION_SOLUTION | Freq: Three times a day (TID) | RESPIRATORY_TRACT | Status: AC

## 2011-02-07 MED ORDER — ENSURE PUDDING PO PUDG: 1.0000 | Freq: Three times a day (TID) | ORAL | Status: AC

## 2011-02-07 MED ORDER — ROSUVASTATIN CALCIUM 10 MG PO TABS: 10.0000 mg | ORAL_TABLET | Freq: Every day | ORAL | Status: DC

## 2011-02-07 MED ORDER — CARVEDILOL 6.25 MG PO TABS: 6.2500 mg | ORAL_TABLET | Freq: Two times a day (BID) | ORAL | Status: DC

## 2011-02-07 MED ORDER — WARFARIN SODIUM 2.5 MG PO TABS: 2.5000 mg | ORAL_TABLET | Freq: Every day | ORAL | Status: AC

## 2011-02-07 NOTE — Progress Notes (Signed)
ANTICOAGULATION CONSULT NOTE - Follow Up Consult  Pharmacy Consult for Coumadin Indication:  R femoral DVT (12/24/2010)  No Known Allergies   Patient Measurements: Height: 5' (152.4 cm) Weight: 76 lb 11.2 oz (34.791 kg) IBW/kg (Calculated) : 45.5   Vital Signs: Temp: 98.8 F (37.1 C) (01/11 0559) Temp src: Oral (01/11 0559) BP: 102/58 mmHg (01/11 0820) Pulse Rate: 82  (01/11 0741)  Labs:  Basename 02/07/11 0550 02/06/11 0610 02/05/11 0540  HGB -- 11.0* --  HCT -- 33.5* --  PLT -- 271 --  APTT -- -- --  LABPROT 28.4* -- 26.6*  INR 2.62* -- 2.41*  HEPARINUNFRC -- -- --  CREATININE 0.60 0.62 0.69  CKTOTAL -- -- --  CKMB -- -- --  TROPONINI -- -- --   Estimated Creatinine Clearance: 37.5 ml/min (by C-G formula based on Cr of 0.6).  Assessment:   INR remains therapeutic.   On Amiodarone 200 mg daily - planned for 6-8 weeks, begun 12/17-> (6 wks = 1/29, 8 wks = 2/12). Coumadin needs will likely change once off Amiodarone. Plavix dc'd after 30 days per prior plan. Off Aspirin since 12/25. Currently on MWF PT/INR checks.  Goal of Therapy:  INR 2-3   Plan:     Will continue Coumadin 2.5 mg daily.    Next PT/INR on 1/14.  Dennie Fetters Pager: [phone] 02/07/2011,11:35 AM

## 2011-02-07 NOTE — Progress Notes (Signed)
Pt daughter has accepted bed at [facility]. CSW will coordinate transfer to SNF when d/c summary is ready.  Baxter Flattery, MSW [phone]

## 2011-02-07 NOTE — Progress Notes (Signed)
   CARE MANAGEMENT NOTE 02/07/2011  Patient:  KANAE, IGNATOWSKI   Account Number:  [id number]  Date Initiated:  01/02/2011  Documentation initiated by:  Junius Creamer  Subjective/Objective Assessment:   adm w femur fx     Action/Plan:   from nsg facility   Anticipated DC Date:  02/07/2011   Anticipated DC Plan:  SKILLED NURSING FACILITY  In-house referral  Clinical Social Worker      DC Planning Services  CM consult      Choice offered to / List presented to:             Status of service:  Completed, signed off Medicare Important Message given?   (If response is "NO", the following Medicare IM given date fields will be blank) Date Medicare IM given:   Date Additional Medicare IM given:    Discharge Disposition:  SKILLED NURSING FACILITY  Per UR Regulation:  Reviewed for med. necessity/level of care/duration of stay  Comments:  02/07/11 Ardean Simonich,RN,[name] 1500 PT TO DC TO SNF TODAY PER CSW ARRANGEMENTS. Phone #[phone]   02/05/11 Jayelyn Barno,RN,[name] 1343 PT EVALUATED FOR CIR ADMISSION--FAMILY INITIALLY STATED THAT THEY WOULD BE ABLE TO PROVIDE 24HR CARE AT DC FROM CIR, BUT NOW STATES THAT THEY ARE NOT WILLING TO TAKE THIS ON.  PT WILL NEED SKILLED NURSING FACILITY PLACEMENT AS ORIGINALLY EXPECTED.  CSW AWARE OF THIS AND WILL FACILITATE DC TO SNF WHEN MEDICALLY STABLE. Phone #[phone]   02/02/10 Leigh Blas,RN,[name] 1200 PT CONTINUES ON TUBE FEEDS PER NASAL PANDA TUBE.  TO DOWNSIZE TRACH TO #4 TODAY; HOPEFUL THAT PT WILL BE ABLE TO SWALLOW AFTER THIS.  IF NOT, PT WILL NEED PEG PLACED FOR FEEDINGS, THEN PROCEED TO SNF FOR REHAB.  WILL FOLLOW PROGRESS. Phone #[phone]   02-03-11 8:30am [name], [name] - [phone] UR completed.  01-30-11- 6:50am [name], [name] [phone] UR completed. Failed swallowing- panda placed - on TF's. Will need PEG for SNF placement.  01-27-11 2pm [name], [name] [phone] UR completed. - continues on trach  collar.  01-24-11 10:15pm [name], [name] [phone] UR completed.  01-20-11 1:30pm [name] - [phone] UR Completed.  01-17-11 10:45am [name], [name] [phone] UR Completed.  Reintubated -resp distress - possible aspiration.  01/14/11 Ricki Vanhandel,RN,[name] PT ADM FROM SNF WITH RT FEMUR FRACTURE AND DVT.  CSW FOLLOWING, WORKING WITH DAUGHTER TO FACILITATE DC TO SNF WHEN MEDICALLY STABLE FOR DISCHARGE.   01-13-11 2pm [name], [name] [phone] UR completed - now off vent again and off bipap as of this am.  Still on IV heparin, lasix,and antibiotics.   01-07-11 11:10am [name], [name] [phone] UR Completed -- post surgery - coded - in ICU on vent on January 05, 2011 - now extubated.  12/6 sw ref. debbie dowell rn,bsn [id number]

## 2011-02-07 NOTE — Progress Notes (Signed)
Physical Therapy Treatment Patient Details Name: [name] MRN: [id number] DOB: 1945-08-30 Today's Date: 02/07/2011  PT Assessment/Plan  PT - Assessment/Plan Comments on Treatment Session: Pt with Rfemur fx, VDRF, STEMI, and trach collar. Pt requires assist to maintain RLE off floor to prevent weight bearing but able to advance activity today to include hopping 60feet. Pt very pleasant and willing to work with O2sats maintained >94 throughout.  PT Plan: Discharge plan remains appropriate;Frequency remains appropriate Follow Up Recommendations: Skilled nursing facility PT Goals  Acute Rehab PT Goals Pt will go Supine/Side to Sit: with HOB 0 degrees;with supervision PT Goal: Supine/Side to Sit - Progress: Revised (modified due to lack of progress/goal met) Pt will go Sit to Supine/Side: with min assist PT Goal: Sit to Supine/Side - Progress: Revised (modified due to lack of progress/goal met) PT Goal: Sit to Stand - Progress: Progressing toward goal Pt will go Stand to Sit: with min assist PT Goal: Stand to Sit - Progress: Updated due to goals met PT Transfer Goal: Bed to Chair/Chair to Bed - Progress: Progressing toward goal Pt will Ambulate: with min assist PT Goal: Ambulate - Progress: Revised (modified due to lack of progress/goal met) PT Goal: Perform Home Exercise Program - Progress: Progressing toward goal  PT Treatment Precautions/Restrictions  Precautions Precautions: Fall Required Braces or Orthoses: No Restrictions Weight Bearing Restrictions: Yes RLE Weight Bearing: Touchdown weight bearing Other Position/Activity Restrictions: Right LE ROM without limitations. Mobility (including Balance) Bed Mobility Supine to Sit: 5: Supervision;With rails;HOB elevated (Comment degrees) (HOB 20 degrees) Supine to Sit Details (indicate cue type and reason): cueing to sequence and increased time Sitting - Scoot to Edge of Bed: 5: Supervision Sitting - Scoot to Edge of Bed Details  (indicate cue type and reason): cueing to complete and increased time Transfers Sit to Stand: 1: +2 Total assist;Patient percentage (comment) (Pt = 70%) Sit to Stand Details (indicate cue type and reason): Pt Rfoot on PT foot to maintain TDWB- truly NWB performed. Cues for hand placement and sequence x2 trials Stand to Sit: 3: Mod assist;To chair/3-in-1;With armrests Stand to Sit Details: cueing and assist to reach for armrests, to maintain RLE off the floor and control descent x2 trials Ambulation/Gait Ambulation/Gait: Yes Ambulation/Gait Assistance: 1: +2 Total assist;Patient percentage (comment) (pt=70%) Ambulation/Gait Assistance Details (indicate cue type and reason): PT maintained foot under pt RLE to prevent weight bearing and pt able to hop with min assist to advance RW, assist for balance and chair pulled behind for safety Ambulation Distance (Feet): 9 Feet (9ft then 56ft after seated rest of ) Assistive device: Rolling walker Gait Pattern: Trunk flexed;Step-to pattern Stairs: No    Exercise  General Exercises - Lower Extremity Long Arc Quad: AROM;Both;20 reps;Seated Hip ABduction/ADduction: AROM;Both;20 reps;Seated Hip Flexion/Marching: AROM;Both;Seated;Other reps (comment) (20reps, tactile cueing for all exercises) End of Session PT - End of Session Equipment Utilized During Treatment: Gait belt Activity Tolerance: Patient tolerated treatment well Patient left: in chair;with call bell in reach Nurse Communication: Mobility status for transfers General Behavior During Session: [facility] for tasks performed Cognition: Impaired Cognitive Impairment: Pt oriented to place and person only.   Toney Sang Beth 02/07/2011, 10:14 AM Toney Sang, PT [phone]

## 2011-02-11 ENCOUNTER — Encounter (HOSPITAL_COMMUNITY): Payer: Self-pay | Admitting: *Deleted

## 2011-02-11 ENCOUNTER — Emergency Department (HOSPITAL_COMMUNITY)
Admission: EM | Admit: 2011-02-11 | Discharge: 2011-02-11 | Disposition: A | Discharge status: Other Acute Inpatient Hospital | Payer: Medicare Other | Attending: Emergency Medicine | Admitting: Emergency Medicine

## 2011-02-11 DIAGNOSIS — Z79899 Other long term (current) drug therapy: Secondary | ICD-10-CM | POA: Insufficient documentation

## 2011-02-11 DIAGNOSIS — Y838 Other surgical procedures as the cause of abnormal reaction of the patient, or of later complication, without mention of misadventure at the time of the procedure: Secondary | ICD-10-CM | POA: Insufficient documentation

## 2011-02-11 DIAGNOSIS — I252 Old myocardial infarction: Secondary | ICD-10-CM | POA: Insufficient documentation

## 2011-02-11 DIAGNOSIS — I10 Essential (primary) hypertension: Secondary | ICD-10-CM | POA: Insufficient documentation

## 2011-02-11 DIAGNOSIS — J9509 Other tracheostomy complication: Secondary | ICD-10-CM | POA: Insufficient documentation

## 2011-02-11 DIAGNOSIS — Z86718 Personal history of other venous thrombosis and embolism: Secondary | ICD-10-CM | POA: Insufficient documentation

## 2011-02-11 DIAGNOSIS — M129 Arthropathy, unspecified: Secondary | ICD-10-CM | POA: Insufficient documentation

## 2011-02-11 MED ORDER — LIDOCAINE VISCOUS 2 % MT SOLN
OROMUCOSAL | Status: AC
  Filled 2011-02-11: qty 30

## 2011-02-11 NOTE — ED Notes (Signed)
Stoma cleansed,  Dry dressing placed per ermd orders.  Dressing orders to be included in the d/c instructions.  PTAR has been paged.  Report to be called to [facility]

## 2011-02-11 NOTE — Procedures (Signed)
02/11/2011  9:53 AM    Jesse Fall  [id number]   Pre-Op Dx:  Tracheostomy dislodgement  Post-op Dx: same  Proc: Flexible laryngoscopy   Surg:  Flo Shanks T MD  Anes:  GOT  EBL:  0  Comp:  none  Findings:  Sl irregularity of supraglottic larynx and posterior vocal cords c/w intubation. VC's fully mobile with good airway.  Procedure: With informed consent, 5 cc 1% viscous xylocaine was placed bilat nares.  Flexible laryngoscope was passed through the RIGHT nose without difficulty.  Findings were as described above.  Scope was removed.  Patient tolerated the procedure well.  Dispo:   Per Dr. Celestia Khat  Plan:  Airway appears adequate for no need for further tracheostomy.  May need swallowing eval.  Could probably re-introduce trach per freshly healing tract if needed.  Cephus Richer MD

## 2011-02-11 NOTE — ED Notes (Signed)
Patient denies pain and is resting comfortably.  

## 2011-02-11 NOTE — ED Provider Notes (Signed)
Medical screening examination/treatment/procedure(s) were performed by non-physician practitioner and as supervising physician I was immediately available for consultation/collaboration.  Nicholes Stairs, MD 02/11/11 [phone]

## 2011-02-11 NOTE — ED Notes (Signed)
Per EMS: pt resident of [facility] Per staff, states pt trach stoma was dislodged sometime between 2-4 am this morning.  O2 sats 96% on RA, no respiratory distress.  Unable to get stoma back in

## 2011-02-11 NOTE — ED Notes (Signed)
Family at bedside. 

## 2011-02-11 NOTE — ED Notes (Signed)
Supplies obtained for ENT MD and at bedside with nosebleed cart for headlight.

## 2011-02-11 NOTE — ED Provider Notes (Signed)
History     CSN: [id number]  Arrival date & time 02/11/11  8295   First MD Initiated Contact with Patient 02/11/11 0606      Chief Complaint  Patient presents with  . Tracheostomy Tube Change    (Consider location/radiation/quality/duration/timing/severity/associated sxs/prior treatment) HPI History provided by pt.   Pt resides at [facility].  Per nursing staff, her trach fell out sometime between 2 and 4am today.  They attempted to replace it but were unsuccessful.  Pt is unsure of how trach came out.  She denies having pain or dyspnea.  Per prior chart, trach placed by critical care on 01/17/11.    Past Medical History  Diagnosis Date  . Hypertension   . 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 Vitamin B12 deficiency (dietary) anemia 12/27/2010  . Arthritis   . MI (myocardial infarction) 01/09/2011  . Aspiration pneumonia 01/17/2011  . VT (ventricular tachycardia) 01/28/2011    Past Surgical History  Procedure Date  . Abdominal hysterectomy     06/2010  . Femur im nail 01/03/2011    Procedure: INTRAMEDULLARY (IM) RETROGRADE FEMORAL NAILING;  Surgeon: Budd Palmer;  Location: [facility] OR;  Service: Orthopedics;  Laterality: Right;  Right IM Retrograde Femoral Nailing    History reviewed. No pertinent family history.  History  Substance Use Topics  . Smoking status: Never Smoker   . Smokeless tobacco: Never Used  . Alcohol Use: No    OB History    Grav Para Term Preterm Abortions TAB SAB Ect Mult Living   1 1 1       1       Review of Systems  All other systems reviewed and are negative.    Allergies  Review of patient's allergies indicates no known allergies.  Home Medications   Current Outpatient Rx  Name Route Sig Dispense Refill  . AMIODARONE HCL 200 MG PO TABS Oral Take 1 tablet (200 mg total) by mouth daily.    . ASPIRIN 81 MG PO CHEW Oral Chew 81 mg by mouth daily.    Marland Kitchen BISACODYL 10 MG RE SUPP Rectal Place 10 mg rectally daily as  needed. If Milk of Magnesium does not stop constipation    . FUROSEMIDE 40 MG PO TABS Oral Take 1 tablet (40 mg total) by mouth daily. 30 tablet   . LEVALBUTEROL HCL 0.63 MG/3ML IN NEBU Nebulization Take 3 mLs (0.63 mg total) by nebulization 3 (three) times daily. 3 mL   . MAGNESIUM HYDROXIDE 400 MG/5ML PO SUSP Oral Take 30 mLs by mouth daily as needed. For constipation    . ROSUVASTATIN CALCIUM 10 MG PO TABS Oral Take 1 tablet (10 mg total) by mouth daily at 6 PM.    . TGT SALINE LAXATIVE RE ENEM Rectal Place 1 application rectally daily as needed. If bisacodyl suppository does not work to relieve constipation    . SPIRONOLACTONE 25 MG PO TABS Oral Take 1 tablet (25 mg total) by mouth 2 (two) times daily.    . WARFARIN SODIUM 2.5 MG PO TABS Oral Take 1 tablet (2.5 mg total) by mouth daily at 6 PM.    . ENSURE PUDDING PO PUDG Oral Take 1 Container by mouth 3 (three) times daily with meals.    Marland Kitchen PRO-STAT 64 PO LIQD Oral Take 30 mLs by mouth 2 (two) times daily at 10 AM and 5 PM. 900 mL   . STARCH (THICKENING) PO POWD  As needed for nectar thick liquids    .  LEVALBUTEROL HCL 0.63 MG/3ML IN NEBU Nebulization Take 3 mLs (0.63 mg total) by nebulization every 3 (three) hours as needed for wheezing or shortness of breath. 3 mL     BP 102/64  Pulse 81  Temp(Src) 98.6 F (37 C) (Oral)  Resp 20  SpO2 97%  Physical Exam  Nursing note and vitals reviewed. Constitutional: She is oriented to person, place, and time. She appears well-developed and well-nourished. No distress.  HENT:  Head: Normocephalic and atraumatic.       PT is breathing through her nose.   Eyes:       Normal appearance  Neck: Normal range of motion.       Trach stoma; appears to be clean and no drainage  Cardiovascular: Normal rate and regular rhythm.   Pulmonary/Chest: Effort normal and breath sounds normal.  Neurological: She is alert and oriented to person, place, and time.  Skin: Skin is warm and dry. No rash noted.    Psychiatric: She has a normal mood and affect. Her behavior is normal.    ED Course  Procedures (including critical care time)  Labs Reviewed - No data to display No results found.   1. Tracheostomy complication       MDM  Pt sent from SNF to have trach replaced.  Larey Seat out while she was sleeping early this morning.  Pt denies pain and dyspnea.  She is breathing through her nose. Consulted ENT and they will replace in ED.   Dr. Lazarus Salines has seen pt in ED.  He was unable to place a new trach because stoma closed.  He contacted critical care, they have seen her ED and she no longer needs trach from their standpoint.  Nursing staff covered stoma in gauze and pt d/c'd back to nursing home w/ care instructions.   10:45 AM         Otilio Miu, PA 02/11/11 1046

## 2011-02-11 NOTE — Consult Note (Signed)
[name], [name] [id number] 08/24/1945  Reason for Consult:  Trach dislodgement Requesting Physician:  Nicholes Stairs, MD   HPI:  66 yo bf, resident of [facility], had a percutaneous trach placed in [facility] mid DEC.  Records cannot be adequately accessed in Epic.  Apparently had a femoral fx, operative repair, DVT, cardiology consult including cath.  One comment regarding difficult intubation.  Required reintubation several days after extubation for possible aspiration.  This AM, nurses noted trach was out of stoma and stoma almost completely closed.  Transferred to ER for further eval.  ENT called for assistance.  ROS:  Negative except as in HPI.  Past Medical History  Diagnosis Date  . Hypertension   . 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 Vitamin B12 deficiency (dietary) anemia 12/27/2010  . Arthritis   . MI (myocardial infarction) 01/09/2011  . Aspiration pneumonia 01/17/2011  . VT (ventricular tachycardia) 01/28/2011    No Known Allergies  Scheduled Medications:    . lidocaine        PRN Meds:   Infusions:     Physical Exam:  Filed Vitals:   02/11/11 0749  BP: 102/64  Pulse: 81  Temp: 98.3 F (36.8 C)  Resp: 16    Thin, talking and breathing easily.  No stridor.  No cough or throat clearing.  Stoma healed shut.  Her history is disjointed, including references to leaving the SNF to go to work, and someone removing the trach while she was at work.   IMPRESSION:  Dislodged tracheostomy.  Uncertain need for airway support.  Possible dementia.  Old records not available.  PLAN:   Flexible laryngoscopy performed (see procedure note).  Sl erythema and swelling of LEFT arytenoid.  Vocal cords fully mobile.  Airway good.  No narrowing seen in upper trachea to a distance of 3-4 cm.  Sl irregularity of posterior vocal cords c/w intubation injury.  No visible pooling or aspiration.     Flo Shanks T 02/11/2011, 9:42 AM

## 2011-02-11 NOTE — ED Notes (Signed)
Patient is alert  Airway remains patent.  Dr Lazarus Salines at the bedside.  Laryngoscope performed and tolerated well.  Dr Tyson Alias has been paged and talking with Dr Lazarus Salines at this time.  Patient ostomy is closed.

## 2011-02-17 ENCOUNTER — Encounter (HOSPITAL_COMMUNITY): Payer: Self-pay

## 2011-02-17 ENCOUNTER — Emergency Department (HOSPITAL_COMMUNITY): Payer: Medicare Other

## 2011-02-17 ENCOUNTER — Emergency Department (HOSPITAL_COMMUNITY)
Admission: EM | Admit: 2011-02-17 | Discharge: 2011-02-17 | Disposition: A | Discharge status: Skilled Nursing Facility | Payer: Medicare Other | Attending: Emergency Medicine | Admitting: Emergency Medicine

## 2011-02-17 DIAGNOSIS — R269 Unspecified abnormalities of gait and mobility: Secondary | ICD-10-CM | POA: Insufficient documentation

## 2011-02-17 DIAGNOSIS — I10 Essential (primary) hypertension: Secondary | ICD-10-CM | POA: Insufficient documentation

## 2011-02-17 DIAGNOSIS — M7989 Other specified soft tissue disorders: Secondary | ICD-10-CM

## 2011-02-17 DIAGNOSIS — M79609 Pain in unspecified limb: Secondary | ICD-10-CM | POA: Insufficient documentation

## 2011-02-17 DIAGNOSIS — Z86718 Personal history of other venous thrombosis and embolism: Secondary | ICD-10-CM | POA: Insufficient documentation

## 2011-02-17 DIAGNOSIS — M810 Age-related osteoporosis without current pathological fracture: Secondary | ICD-10-CM | POA: Insufficient documentation

## 2011-02-17 DIAGNOSIS — Z79899 Other long term (current) drug therapy: Secondary | ICD-10-CM | POA: Insufficient documentation

## 2011-02-17 DIAGNOSIS — Z7901 Long term (current) use of anticoagulants: Secondary | ICD-10-CM | POA: Insufficient documentation

## 2011-02-17 DIAGNOSIS — M129 Arthropathy, unspecified: Secondary | ICD-10-CM | POA: Insufficient documentation

## 2011-02-17 DIAGNOSIS — I4891 Unspecified atrial fibrillation: Secondary | ICD-10-CM | POA: Insufficient documentation

## 2011-02-17 DIAGNOSIS — I252 Old myocardial infarction: Secondary | ICD-10-CM | POA: Insufficient documentation

## 2011-02-17 HISTORY — DX: Respiratory failure, unspecified, unspecified whether with hypoxia or hypercapnia: J96.90

## 2011-02-17 HISTORY — DX: Cardiogenic shock: R57.0

## 2011-02-17 HISTORY — DX: Unspecified fracture of right femur, initial encounter for closed fracture: S72.91XA

## 2011-02-17 HISTORY — DX: Unspecified atrial fibrillation: I48.91

## 2011-02-17 LAB — CBC
HCT: 30.8 % — ABNORMAL LOW (ref 36.0–46.0)
Hemoglobin: 10 g/dL — ABNORMAL LOW (ref 12.0–15.0)
MCH: 26 pg (ref 26.0–34.0)
RBC: 3.85 MIL/uL — ABNORMAL LOW (ref 3.87–5.11)

## 2011-02-17 LAB — POCT I-STAT, CHEM 8
BUN: 12 mg/dL (ref 6–23)
Calcium, Ion: 1.12 mmol/L (ref 1.12–1.32)
Chloride: 92 mEq/L — ABNORMAL LOW (ref 96–112)
Creatinine, Ser: 0.8 mg/dL (ref 0.50–1.10)
Glucose, Bld: 73 mg/dL (ref 70–99)
HCT: 32 % — ABNORMAL LOW (ref 36.0–46.0)
Hemoglobin: 10.9 g/dL — ABNORMAL LOW (ref 12.0–15.0)
Potassium: 3.6 mEq/L (ref 3.5–5.1)
Sodium: 132 mEq/L — ABNORMAL LOW (ref 135–145)
TCO2: 27 mmol/L (ref 0–100)

## 2011-02-17 LAB — PROTIME-INR
INR: 1.9 — ABNORMAL HIGH (ref 0.00–1.49)
Prothrombin Time: 22.1 seconds — ABNORMAL HIGH (ref 11.6–15.2)

## 2011-02-17 NOTE — ED Provider Notes (Signed)
History     CSN: [id number]  Arrival date & time 02/17/11  1144   First MD Initiated Contact with Patient 02/17/11 1214      Chief Complaint  Patient presents with  . Leg Swelling    right leg    (Consider location/radiation/quality/duration/timing/severity/associated sxs/prior treatment) The history is provided by the patient and a relative.   the patient is a 66 year old female with a history of right femur fracture after a fall on 12/24/2010 as well as a DVT to the right lower extremity who is status post IM rod placement on 01/03/2011. She comes to the emergency department from rehabilitation where she has been since January 11 complaining of a "knot" to the right thigh and associated right lower extremity swelling x1 day. There has been no new injury. She complains of mild pain to the extremity that is nonradiating, aching, constant. There is no associated numbness or weakness. The patient is on Coumadin therapy for her recent DVT.   Past Medical History  Diagnosis Date  . Hypertension   . 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 Vitamin B12 deficiency (dietary) anemia 12/27/2010  . Arthritis   . MI (myocardial infarction) 01/09/2011  . Aspiration pneumonia 01/17/2011  . VT (ventricular tachycardia) 01/28/2011  . Femur fracture, right   . Osteoporosis   . Cardiogenic shock   . Respiratory failure   . Atrial fibrillation     Past Surgical History  Procedure Date  . Abdominal hysterectomy     06/2010  . Femur im nail 01/03/2011    Procedure: INTRAMEDULLARY (IM) RETROGRADE FEMORAL NAILING;  Surgeon: Budd Palmer;  Location: [facility] OR;  Service: Orthopedics;  Laterality: Right;  Right IM Retrograde Femoral Nailing    History reviewed. No pertinent family history.  History  Substance Use Topics  . Smoking status: Never Smoker   . Smokeless tobacco: Never Used  . Alcohol Use: No    OB History    Grav Para Term Preterm Abortions TAB SAB Ect Mult Living   1 1  1       1       Review of Systems  Constitutional: Negative for fever and chills.  HENT: Negative for sore throat, neck pain, neck stiffness and tinnitus.   Eyes: Negative for pain and visual disturbance.  Respiratory: Negative for cough, chest tightness and shortness of breath.   Cardiovascular: Positive for leg swelling. Negative for chest pain.  Gastrointestinal: Negative for nausea, vomiting and abdominal pain.  Genitourinary: Negative for dysuria and flank pain.  Musculoskeletal: Positive for gait problem. Negative for back pain.  Skin: Negative for color change and wound.  Neurological: Negative for dizziness, syncope, weakness and numbness.  Psychiatric/Behavioral: Negative for confusion.    Allergies  Review of patient's allergies indicates no known allergies.  Home Medications   Current Outpatient Rx  Name Route Sig Dispense Refill  . CARVEDILOL 6.25 MG PO TABS Oral Take 6.25 mg by mouth 2 (two) times daily with a meal.    . CYANOCOBALAMIN 1000 MCG/ML IJ SOLN Intramuscular Inject 1,000 mcg into the muscle every 7 (seven) days.    . AMIODARONE HCL 200 MG PO TABS Oral Take 1 tablet (200 mg total) by mouth daily.    Marland Kitchen ENSURE PUDDING PO PUDG Oral Take 1 Container by mouth 3 (three) times daily with meals.    Marland Kitchen PRO-STAT 64 PO LIQD Oral Take 30 mLs by mouth 2 (two) times daily at 10 AM and 5 PM. 900 mL   .  FUROSEMIDE 40 MG PO TABS Oral Take 1 tablet (40 mg total) by mouth daily. 30 tablet   . LEVALBUTEROL HCL 0.63 MG/3ML IN NEBU Nebulization Take 3 mLs (0.63 mg total) by nebulization 3 (three) times daily. 3 mL   . LEVALBUTEROL HCL 0.63 MG/3ML IN NEBU Nebulization Take 3 mLs (0.63 mg total) by nebulization every 3 (three) hours as needed for wheezing or shortness of breath. 3 mL   . ROSUVASTATIN CALCIUM 10 MG PO TABS Oral Take 10 mg by mouth daily.    Marland Kitchen SPIRONOLACTONE 25 MG PO TABS Oral Take 1 tablet (25 mg total) by mouth 2 (two) times daily.    . WARFARIN SODIUM 2.5 MG PO TABS  Oral Take 1 tablet (2.5 mg total) by mouth daily at 6 PM.      BP 101/58  Pulse 77  Temp(Src) 98.2 F (36.8 C) (Oral)  Resp 17  Ht 5' (1.524 m)  Wt 128 lb (58.06 kg)  BMI 25.00 kg/m2  SpO2 96%  Physical Exam  Nursing note and vitals reviewed. Constitutional: She is oriented to person, place, and time. She appears well-developed and well-nourished. No distress.  HENT:  Head: Normocephalic and atraumatic.  Right Ear: External ear normal.  Left Ear: External ear normal.  Eyes: Pupils are equal, round, and reactive to light.  Neck: Normal range of motion. Neck supple.  Cardiovascular: Normal rate, regular rhythm and intact distal pulses.   Pulmonary/Chest: Effort normal and breath sounds normal. No respiratory distress. She exhibits no tenderness.  Abdominal: Soft. Bowel sounds are normal. She exhibits no distension. There is no tenderness.  Musculoskeletal:       Slightly decreased right hip flexion. There is mild swelling to the right lower leg that is nonpitting. There is mild tenderness to palpation of the right lower extremity. There are several areas of the right thigh with firm subcutaneous nodules; these are under surgical incision sites and none have erythema, induration, or excess warmth. Bilateral foot and great toe plantar and dorsi flexion strength is 5 out of 5.  Neurological: She is alert and oriented to person, place, and time.       Sensation intact to light touch  Skin: Skin is warm and dry. No rash noted. No erythema.    ED Course  Procedures (including critical care time)  Labs Reviewed  PROTIME-INR - Abnormal; Notable for the following:    Prothrombin Time 22.1 (*)    INR 1.90 (*)    All other components within normal limits  CBC - Abnormal; Notable for the following:    RBC 3.85 (*)    Hemoglobin 10.0 (*)    HCT 30.8 (*)    RDW 16.4 (*)    All other components within normal limits  POCT I-STAT, CHEM 8 - Abnormal; Notable for the following:    Sodium  132 (*)    Chloride 92 (*)    Hemoglobin 10.9 (*)    HCT 32.0 (*)    All other components within normal limits  I-STAT, CHEM 8   Dg Femur Right  02/17/2011  *RADIOLOGY REPORT*  Clinical Data: Pain.  Status post ORIF for femur fracture by 1 month ago.  RIGHT FEMUR - 2 VIEW  Comparison: 01/03/2011  Findings: Retrograde intramedullary nail is identified in the right femur, crossing a comminuted fracture of the distal femoral diaphysis.  Two proximal and three distal interlocking screws are evident.  No hardware complications.  Bony alignment and hardware position is stable in the  interval.  IMPRESSION: Stable exam.  No new or acute findings.  Original Report Authenticated By: ERIC A. MANSELL, M.D.     1. Leg swelling       MDM  Labs, right femur x-ray, right lower extremity Doppler are all reviewed. Mild anemia. INR slightly below therapeutic range; patient is on a very low dose of Coumadin and I do not feel that an adjustment of her dose is necessary at this time. X-ray shows stable hardware with no acute findings. Doppler study significant for fluid collection in the popliteal fossa; suspicious for ruptured Baker's cyst. No evidence of DVT. Results have been discussed with patient he will be discharged back to the rehabilitation facility.        [address] [place], [place] 02/17/11 [phone]

## 2011-02-17 NOTE — ED Notes (Signed)
Place call to [facility] for transportation back to [facility]

## 2011-02-17 NOTE — Progress Notes (Signed)
Preliminary Report   Right:  No obvious evidence of deep vein thrombosis.  Incidental findings : Fluid filled area on the mid anterior thigh measuring 2.67 x 0.71. X 0.74cm  Incidental findings : Fluid filled area in the popliteal fossa measuring 4.30 x 3.56 x 3.60 cm.   Heloise Purpura, RDMS 02/17/2011

## 2011-02-17 NOTE — ED Notes (Signed)
Patient presents via EMS from [facility] for further evaluation of right lower extremity swelling x 1 day. Patient fell on 12/24/10, right DVT was found along with a right femoral fracture, patient had surgery on 01/03/11. Pedal pulses palpated and are strong bilaterally.  Swelling present to right lower extremity.

## 2011-02-18 NOTE — ED Provider Notes (Signed)
Medical screening examination/treatment/procedure(s) were conducted as a shared visit with non-physician practitioner(s) and myself.  I personally evaluated the patient during the encounter.  Patient with right knee pain and swelling. Doppler ultrasound was negative for venous thrombosis. Patient is on Coumadin INR is 1.93. There is a fluid collection that was noted on the ultrasound. Consider ruptured Baker's cyst. Consider septic arthritis but feel that this is much less likely. Patient with only mild increase of pain with range of motion. Return precautions discussed with patient and daughter. Neurovascularly intact distally. Outpatient followup.  Raeford Razor, MD 02/18/11 [phone]

## 2011-02-24 ENCOUNTER — Inpatient Hospital Stay (HOSPITAL_COMMUNITY)
Admission: EM | Admit: 2011-02-24 | Discharge: 2011-02-28 | DRG: 871 | Disposition: E | Payer: Medicare Other | Attending: Internal Medicine | Admitting: Internal Medicine

## 2011-02-24 ENCOUNTER — Inpatient Hospital Stay (HOSPITAL_COMMUNITY): Payer: Medicare Other

## 2011-02-24 ENCOUNTER — Other Ambulatory Visit: Payer: Self-pay

## 2011-02-24 ENCOUNTER — Encounter (HOSPITAL_COMMUNITY): Payer: Self-pay | Admitting: *Deleted

## 2011-02-24 ENCOUNTER — Emergency Department (HOSPITAL_COMMUNITY): Payer: Medicare Other

## 2011-02-24 DIAGNOSIS — Z86718 Personal history of other venous thrombosis and embolism: Secondary | ICD-10-CM

## 2011-02-24 DIAGNOSIS — E872 Acidosis, unspecified: Secondary | ICD-10-CM | POA: Diagnosis present

## 2011-02-24 DIAGNOSIS — J9600 Acute respiratory failure, unspecified whether with hypoxia or hypercapnia: Secondary | ICD-10-CM

## 2011-02-24 DIAGNOSIS — E538 Deficiency of other specified B group vitamins: Secondary | ICD-10-CM | POA: Diagnosis present

## 2011-02-24 DIAGNOSIS — I255 Ischemic cardiomyopathy: Secondary | ICD-10-CM

## 2011-02-24 DIAGNOSIS — Z9861 Coronary angioplasty status: Secondary | ICD-10-CM

## 2011-02-24 DIAGNOSIS — I472 Ventricular tachycardia, unspecified: Secondary | ICD-10-CM | POA: Diagnosis present

## 2011-02-24 DIAGNOSIS — R791 Abnormal coagulation profile: Secondary | ICD-10-CM | POA: Diagnosis present

## 2011-02-24 DIAGNOSIS — I252 Old myocardial infarction: Secondary | ICD-10-CM

## 2011-02-24 DIAGNOSIS — A419 Sepsis, unspecified organism: Secondary | ICD-10-CM | POA: Diagnosis present

## 2011-02-24 DIAGNOSIS — M810 Age-related osteoporosis without current pathological fracture: Secondary | ICD-10-CM | POA: Diagnosis present

## 2011-02-24 DIAGNOSIS — I4729 Other ventricular tachycardia: Secondary | ICD-10-CM | POA: Diagnosis present

## 2011-02-24 DIAGNOSIS — Z79899 Other long term (current) drug therapy: Secondary | ICD-10-CM

## 2011-02-24 DIAGNOSIS — Z7901 Long term (current) use of anticoagulants: Secondary | ICD-10-CM

## 2011-02-24 DIAGNOSIS — J690 Pneumonitis due to inhalation of food and vomit: Secondary | ICD-10-CM | POA: Diagnosis present

## 2011-02-24 DIAGNOSIS — I469 Cardiac arrest, cause unspecified: Secondary | ICD-10-CM

## 2011-02-24 DIAGNOSIS — D649 Anemia, unspecified: Secondary | ICD-10-CM | POA: Diagnosis present

## 2011-02-24 DIAGNOSIS — E162 Hypoglycemia, unspecified: Secondary | ICD-10-CM | POA: Diagnosis present

## 2011-02-24 DIAGNOSIS — M129 Arthropathy, unspecified: Secondary | ICD-10-CM | POA: Diagnosis present

## 2011-02-24 DIAGNOSIS — R6521 Severe sepsis with septic shock: Secondary | ICD-10-CM | POA: Diagnosis present

## 2011-02-24 DIAGNOSIS — I509 Heart failure, unspecified: Secondary | ICD-10-CM | POA: Diagnosis present

## 2011-02-24 DIAGNOSIS — J984 Other disorders of lung: Secondary | ICD-10-CM

## 2011-02-24 DIAGNOSIS — J189 Pneumonia, unspecified organism: Secondary | ICD-10-CM

## 2011-02-24 DIAGNOSIS — I4891 Unspecified atrial fibrillation: Secondary | ICD-10-CM | POA: Diagnosis present

## 2011-02-24 DIAGNOSIS — E871 Hypo-osmolality and hyponatremia: Secondary | ICD-10-CM | POA: Diagnosis present

## 2011-02-24 DIAGNOSIS — N170 Acute kidney failure with tubular necrosis: Secondary | ICD-10-CM

## 2011-02-24 DIAGNOSIS — I2510 Atherosclerotic heart disease of native coronary artery without angina pectoris: Secondary | ICD-10-CM | POA: Diagnosis present

## 2011-02-24 DIAGNOSIS — Z66 Do not resuscitate: Secondary | ICD-10-CM | POA: Diagnosis not present

## 2011-02-24 DIAGNOSIS — N19 Unspecified kidney failure: Secondary | ICD-10-CM

## 2011-02-24 DIAGNOSIS — I10 Essential (primary) hypertension: Secondary | ICD-10-CM | POA: Diagnosis present

## 2011-02-24 LAB — DIFFERENTIAL
Band Neutrophils: 0 % (ref 0–10)
Basophils Relative: 4 % — ABNORMAL HIGH (ref 0–1)
Eosinophils Relative: 0 % (ref 0–5)
Lymphocytes Relative: 38 % (ref 12–46)
Lymphs Abs: 4.4 10*3/uL — ABNORMAL HIGH (ref 0.7–4.0)
Monocytes Absolute: 2.9 10*3/uL — ABNORMAL HIGH (ref 0.1–1.0)
Monocytes Absolute: 2.9 10*3/uL — ABNORMAL HIGH (ref 0.1–1.0)
Monocytes Relative: 9 % (ref 3–12)
Monocytes Relative: 10 % (ref 3–12)
WBC Morphology: INCREASED

## 2011-02-24 LAB — URINALYSIS, ROUTINE W REFLEX MICROSCOPIC
Glucose, UA: NEGATIVE mg/dL
Ketones, ur: 15 mg/dL — AB
Nitrite: NEGATIVE
Protein, ur: 100 mg/dL — AB
Urobilinogen, UA: 1 mg/dL (ref 0.0–1.0)

## 2011-02-24 LAB — COMPREHENSIVE METABOLIC PANEL
ALT: 56 U/L — ABNORMAL HIGH (ref 0–35)
AST: 215 U/L — ABNORMAL HIGH (ref 0–37)
Alkaline Phosphatase: 258 U/L — ABNORMAL HIGH (ref 39–117)
CO2: 12 mEq/L — ABNORMAL LOW (ref 19–32)
GFR calc Af Amer: 26 mL/min — ABNORMAL LOW (ref >90)
Glucose, Bld: 59 mg/dL — ABNORMAL LOW (ref 70–99)
Potassium: 4 mEq/L (ref 3.5–5.1)
Sodium: 138 mEq/L (ref 135–145)
Total Protein: 3 g/dL — ABNORMAL LOW (ref 6.0–8.3)

## 2011-02-24 LAB — POCT I-STAT 3, ART BLOOD GAS (G3+)
Acid-base deficit: 18 mmol/L — ABNORMAL HIGH (ref 0.0–2.0)
Bicarbonate: 7.9 mEq/L — ABNORMAL LOW (ref 20.0–24.0)
Patient temperature: 98.6
Patient temperature: 37.3
TCO2: 9 mmol/L (ref 0–100)
pCO2 arterial: 25.5 mmHg — ABNORMAL LOW (ref 35.0–45.0)
pCO2 arterial: 32.9 mmHg — ABNORMAL LOW (ref 35.0–45.0)
pH, Arterial: 7.472 — ABNORMAL HIGH (ref 7.350–7.400)
pH, Arterial: 6.995 — CL (ref 7.350–7.400)
pO2, Arterial: 151 mmHg — ABNORMAL HIGH (ref 80.0–100.0)
pO2, Arterial: 73 mmHg — ABNORMAL LOW (ref 80.0–100.0)

## 2011-02-24 LAB — PREPARE RBC (CROSSMATCH)

## 2011-02-24 LAB — GLUCOSE, CAPILLARY
Glucose-Capillary: 109 mg/dL — ABNORMAL HIGH (ref 70–99)
Glucose-Capillary: 91 mg/dL (ref 70–99)
Glucose-Capillary: 95 mg/dL (ref 70–99)

## 2011-02-24 LAB — CARBOXYHEMOGLOBIN
Carboxyhemoglobin: 0.6 % (ref 0.5–1.5)
Methemoglobin: 1.7 % — ABNORMAL HIGH (ref 0.0–1.5)
O2 Saturation: 60.7 %

## 2011-02-24 LAB — PATHOLOGIST SMEAR REVIEW

## 2011-02-24 LAB — MRSA PCR SCREENING: MRSA by PCR: NEGATIVE

## 2011-02-24 LAB — CORTISOL: Cortisol, Plasma: 56.7 ug/dL

## 2011-02-24 LAB — CBC
HCT: 20.9 % — ABNORMAL LOW (ref 36.0–46.0)
HCT: 33.3 % — ABNORMAL LOW (ref 36.0–46.0)
Hemoglobin: 10.6 g/dL — ABNORMAL LOW (ref 12.0–15.0)
Hemoglobin: 6.4 g/dL — CL (ref 12.0–15.0)
MCH: 26 pg (ref 26.0–34.0)
MCV: 79.1 fL (ref 78.0–100.0)
MCV: 84.6 fL (ref 78.0–100.0)
Platelets: 416 10*3/uL — ABNORMAL HIGH (ref 150–400)
Platelets: 204 10*3/uL (ref 150–400)
RBC: 4.07 MIL/uL (ref 3.87–5.11)
RBC: 2.47 MIL/uL — ABNORMAL LOW (ref 3.87–5.11)
RDW: 17.8 % — ABNORMAL HIGH (ref 11.5–15.5)
WBC: 31.7 10*3/uL — ABNORMAL HIGH (ref 4.0–10.5)
WBC: 29.4 10*3/uL — ABNORMAL HIGH (ref 4.0–10.5)
WBC: 26.2 10*3/uL — ABNORMAL HIGH (ref 4.0–10.5)

## 2011-02-24 LAB — APTT
aPTT: 79 seconds — ABNORMAL HIGH (ref 24–37)
aPTT: 57 seconds — ABNORMAL HIGH (ref 24–37)

## 2011-02-24 LAB — BASIC METABOLIC PANEL
CO2: 15 mEq/L — ABNORMAL LOW (ref 19–32)
Chloride: 82 mEq/L — ABNORMAL LOW (ref 96–112)
Creatinine, Ser: 2.61 mg/dL — ABNORMAL HIGH (ref 0.50–1.10)
GFR calc Af Amer: 21 mL/min — ABNORMAL LOW (ref >90)
Potassium: 5.4 mEq/L — ABNORMAL HIGH (ref 3.5–5.1)

## 2011-02-24 LAB — PROTIME-INR
INR: 7.41 (ref 0.00–1.49)
INR: >10 (ref 0.00–1.49)
INR: 2.69 — ABNORMAL HIGH (ref 0.00–1.49)
Prothrombin Time: 64.1 seconds — ABNORMAL HIGH (ref 11.6–15.2)
Prothrombin Time: >90 seconds — ABNORMAL HIGH (ref 11.6–15.2)

## 2011-02-24 LAB — POCT I-STAT TROPONIN I: Troponin i, poc: 0.1 ng/mL (ref 0.00–0.08)

## 2011-02-24 LAB — PRO B NATRIURETIC PEPTIDE: Pro B Natriuretic peptide (BNP): 3867 pg/mL — ABNORMAL HIGH (ref 0–125)

## 2011-02-24 LAB — URINE MICROSCOPIC-ADD ON

## 2011-02-24 LAB — PROCALCITONIN: Procalcitonin: 0.98 ng/mL

## 2011-02-24 LAB — CARDIAC PANEL(CRET KIN+CKTOT+MB+TROPI): Troponin I: <0.3 ng/mL (ref <0.30)

## 2011-02-24 MED ORDER — PHYTONADIONE 5 MG PO TABS
5.0000 mg | ORAL_TABLET | ORAL | Status: DC
  Filled 2011-02-24 (×2): qty 1

## 2011-02-24 MED ORDER — SODIUM CHLORIDE 0.9 % IV BOLUS (SEPSIS)
500.0000 mL | Freq: Once | INTRAVENOUS | Status: AC
  Administered 2011-02-24: 500 mL via INTRAVENOUS

## 2011-02-24 MED ORDER — SODIUM BICARBONATE 8.4 % IV SOLN
INTRAVENOUS | Status: AC
  Administered 2011-02-24: 100 meq
  Filled 2011-02-24: qty 100

## 2011-02-24 MED ORDER — SODIUM CHLORIDE 0.9 % FOR CRRT
INTRAVENOUS_CENTRAL | Status: DC | PRN
  Filled 2011-02-24: qty 1000

## 2011-02-24 MED ORDER — SODIUM CHLORIDE 0.9 % IV SOLN
50.0000 ug/h | INTRAVENOUS | Status: DC
  Administered 2011-02-24: 50 ug/h via INTRAVENOUS
  Filled 2011-02-24: qty 50

## 2011-02-24 MED ORDER — SODIUM CHLORIDE 0.9 % IV SOLN
INTRAVENOUS | Status: DC
  Administered 2011-02-24: 07:00:00 via INTRAVENOUS

## 2011-02-24 MED ORDER — DEXTROSE 5 % IV SOLN
INTRAVENOUS | Status: AC
  Filled 2011-02-24: qty 250

## 2011-02-24 MED ORDER — PIPERACILLIN-TAZOBACTAM IN DEX 2-0.25 GM/50ML IV SOLN
2.2500 g | Freq: Four times a day (QID) | INTRAVENOUS | Status: DC
  Administered 2011-02-24 (×2): 2.25 g via INTRAVENOUS
  Filled 2011-02-24 (×5): qty 50

## 2011-02-24 MED ORDER — VITAMIN K1 10 MG/ML IJ SOLN
2.5000 mg | Freq: Once | INTRAVENOUS | Status: AC
  Administered 2011-02-24: 2.5 mg via INTRAVENOUS
  Filled 2011-02-24: qty 0.25

## 2011-02-24 MED ORDER — DEXTROSE 50 % IV SOLN
INTRAVENOUS | Status: AC
  Administered 2011-02-24: 50 mL
  Filled 2011-02-24: qty 50

## 2011-02-24 MED ORDER — SODIUM BICARBONATE 8.4 % IV SOLN
100.0000 meq | Freq: Once | INTRAVENOUS | Status: DC
Start: 2011-02-24 — End: 2011-02-24

## 2011-02-24 MED ORDER — PHENYLEPHRINE HCL 10 MG/ML IJ SOLN
30.0000 ug/min | INTRAVENOUS | Status: DC
  Administered 2011-02-24: 50 ug/min via INTRAVENOUS
  Filled 2011-02-24 (×3): qty 4

## 2011-02-24 MED ORDER — NOREPINEPHRINE BITARTRATE 1 MG/ML IJ SOLN
INTRAMUSCULAR | Status: AC
  Administered 2011-02-24: 2 ug via INTRAVENOUS
  Filled 2011-02-24: qty 4

## 2011-02-24 MED ORDER — BIOTENE DRY MOUTH MT LIQD
15.0000 mL | Freq: Four times a day (QID) | OROMUCOSAL | Status: DC
  Administered 2011-02-24 – 2011-02-26 (×7): 15 mL via OROMUCOSAL

## 2011-02-24 MED ORDER — SODIUM BICARBONATE 8.4 % IV SOLN
INTRAVENOUS | Status: DC
  Administered 2011-02-24 – 2011-02-25 (×5): via INTRAVENOUS
  Filled 2011-02-24 (×6): qty 150

## 2011-02-24 MED ORDER — SODIUM CHLORIDE 0.9 % IV BOLUS (SEPSIS)
4000.0000 mL | Freq: Once | INTRAVENOUS | Status: AC
  Administered 2011-02-24: 4000 mL via INTRAVENOUS

## 2011-02-24 MED ORDER — DEXTROSE 50 % IV SOLN
INTRAVENOUS | Status: AC
  Administered 2011-02-24: 25 g via INTRAVENOUS
  Filled 2011-02-24: qty 50

## 2011-02-24 MED ORDER — VANCOMYCIN HCL 1000 MG IV SOLR
750.0000 mg | INTRAVENOUS | Status: DC
  Administered 2011-02-24: 750 mg via INTRAVENOUS
  Filled 2011-02-24: qty 750

## 2011-02-24 MED ORDER — NOREPINEPHRINE BITARTRATE 1 MG/ML IJ SOLN
2.0000 ug/min | INTRAMUSCULAR | Status: AC
  Administered 2011-02-24: 40 ug/min via INTRAVENOUS

## 2011-02-24 MED ORDER — NOREPINEPHRINE BITARTRATE 1 MG/ML IJ SOLN
2.0000 ug/min | INTRAVENOUS | Status: DC
  Administered 2011-02-24: 40 ug/min via INTRAVENOUS
  Administered 2011-02-24: 50 ug/min via INTRAVENOUS
  Filled 2011-02-24 (×2): qty 4

## 2011-02-24 MED ORDER — METRONIDAZOLE IN NACL 5-0.79 MG/ML-% IV SOLN
500.0000 mg | Freq: Once | INTRAVENOUS | Status: AC
  Administered 2011-02-24: 500 mg via INTRAVENOUS
  Filled 2011-02-24: qty 100

## 2011-02-24 MED ORDER — VITAMIN K1 10 MG/ML IJ SOLN
10.0000 mg | Freq: Once | INTRAVENOUS | Status: AC
  Administered 2011-02-24: 10 mg via INTRAVENOUS
  Filled 2011-02-24: qty 1

## 2011-02-24 MED ORDER — DEXTROSE 5 % IV SOLN
2.0000 ug/min | INTRAVENOUS | Status: DC
  Administered 2011-02-24: 20 ug/min via INTRAVENOUS
  Administered 2011-02-24: 30 ug/min via INTRAVENOUS
  Administered 2011-02-25 (×4): 50 ug/min via INTRAVENOUS
  Filled 2011-02-24 (×6): qty 16

## 2011-02-24 MED ORDER — HEPARIN SODIUM (PORCINE) 1000 UNIT/ML DIALYSIS
1000.0000 [IU] | INTRAMUSCULAR | Status: DC | PRN
  Filled 2011-02-24: qty 6

## 2011-02-24 MED ORDER — PHENYLEPHRINE HCL 10 MG/ML IJ SOLN: 30.0000 ug/min | INTRAVENOUS | Status: DC

## 2011-02-24 MED ORDER — SODIUM CHLORIDE 0.9 % IJ SOLN
INTRAMUSCULAR | Status: AC
  Administered 2011-02-24: 14:00:00
  Filled 2011-02-24: qty 40

## 2011-02-24 MED ORDER — VASOPRESSIN 20 UNIT/ML IJ SOLN
0.0300 [IU]/min | INTRAVENOUS | Status: DC
  Administered 2011-02-24 – 2011-02-25 (×2): 0.03 [IU]/min via INTRAVENOUS
  Filled 2011-02-24 (×3): qty 2.5

## 2011-02-24 MED ORDER — SODIUM CHLORIDE 0.9 % IV SOLN: 250.0000 mL | INTRAVENOUS | Status: DC | PRN

## 2011-02-24 MED ORDER — HYDROCORTISONE SOD SUCCINATE 100 MG IJ SOLR
50.0000 mg | Freq: Three times a day (TID) | INTRAMUSCULAR | Status: DC
  Administered 2011-02-24: 14:00:00 via INTRAVENOUS
  Administered 2011-02-24 – 2011-02-25 (×4): 50 mg via INTRAVENOUS
  Filled 2011-02-24 (×6): qty 1

## 2011-02-24 MED ORDER — MIDAZOLAM BOLUS VIA INFUSION
1.0000 mg | INTRAVENOUS | Status: DC | PRN
  Filled 2011-02-24: qty 2

## 2011-02-24 MED ORDER — AMIODARONE HCL IN DEXTROSE 360-4.14 MG/200ML-% IV SOLN
1.0000 mg/min | INTRAVENOUS | Status: AC
  Administered 2011-02-24: 1 mg/min via INTRAVENOUS
  Filled 2011-02-24 (×3): qty 200

## 2011-02-24 MED ORDER — STERILE WATER FOR INJECTION IV SOLN
INTRAVENOUS | Status: DC
  Administered 2011-02-24 – 2011-02-25 (×4): via INTRAVENOUS_CENTRAL
  Filled 2011-02-24 (×5): qty 150

## 2011-02-24 MED ORDER — LEVALBUTEROL HCL 0.63 MG/3ML IN NEBU
0.6300 mg | INHALATION_SOLUTION | Freq: Four times a day (QID) | RESPIRATORY_TRACT | Status: DC
  Administered 2011-02-24 – 2011-02-25 (×4): 0.63 mg via RESPIRATORY_TRACT
  Filled 2011-02-24 (×7): qty 3

## 2011-02-24 MED ORDER — DEXTROSE 50 % IV SOLN
25.0000 g | Freq: Once | INTRAVENOUS | Status: AC
  Administered 2011-02-24: 25 g via INTRAVENOUS

## 2011-02-24 MED ORDER — VANCOMYCIN HCL 500 MG IV SOLR
500.0000 mg | INTRAVENOUS | Status: DC
  Administered 2011-02-25: 500 mg via INTRAVENOUS
  Filled 2011-02-24 (×3): qty 500

## 2011-02-24 MED ORDER — PRISMASOL BGK 4/2.5 32-4-2.5 MEQ/L IV SOLN
INTRAVENOUS | Status: DC
  Administered 2011-02-24 – 2011-02-25 (×9): via INTRAVENOUS_CENTRAL
  Filled 2011-02-24 (×16): qty 5000

## 2011-02-24 MED ORDER — PIPERACILLIN-TAZOBACTAM 3.375 G IVPB
3.3750 g | Freq: Once | INTRAVENOUS | Status: AC
  Administered 2011-02-24: 3.375 g via INTRAVENOUS
  Filled 2011-02-24: qty 50

## 2011-02-24 MED ORDER — PIPERACILLIN-TAZOBACTAM 3.375 G IVPB 30 MIN
3.3750 g | Freq: Four times a day (QID) | INTRAVENOUS | Status: DC
  Administered 2011-02-24 – 2011-02-25 (×5): 3.375 g via INTRAVENOUS
  Filled 2011-02-24 (×8): qty 50

## 2011-02-24 MED ORDER — AMIODARONE LOAD VIA INFUSION
150.0000 mg | Freq: Once | INTRAVENOUS | Status: DC
  Filled 2011-02-24: qty 83.34

## 2011-02-24 MED ORDER — SODIUM CHLORIDE 0.9 % IV SOLN
2.0000 mg/h | INTRAVENOUS | Status: DC
  Filled 2011-02-24: qty 10

## 2011-02-24 MED ORDER — STERILE WATER FOR INJECTION IV SOLN
INTRAVENOUS | Status: DC
  Administered 2011-02-24 – 2011-02-25 (×3): via INTRAVENOUS_CENTRAL
  Filled 2011-02-24 (×5): qty 150

## 2011-02-24 MED ORDER — HEPARIN SODIUM (PORCINE) 1000 UNIT/ML IJ SOLN
INTRAMUSCULAR | Status: AC
  Administered 2011-02-24: 14:00:00
  Filled 2011-02-24: qty 3

## 2011-02-24 MED ORDER — ACETAMINOPHEN 650 MG RE SUPP
975.0000 mg | Freq: Once | RECTAL | Status: AC
  Administered 2011-02-24: 975 mg via RECTAL
  Filled 2011-02-24: qty 1

## 2011-02-24 MED ORDER — CHLORHEXIDINE GLUCONATE 0.12 % MT SOLN
15.0000 mL | Freq: Two times a day (BID) | OROMUCOSAL | Status: DC
  Administered 2011-02-24 – 2011-02-25 (×4): 15 mL via OROMUCOSAL
  Filled 2011-02-24 (×3): qty 15

## 2011-02-24 MED ORDER — SODIUM CHLORIDE 0.9 % IV BOLUS (SEPSIS): 25.0000 mL/kg | Freq: Once | INTRAVENOUS | Status: DC

## 2011-02-24 MED ORDER — LEVALBUTEROL HCL 0.63 MG/3ML IN NEBU
0.6300 mg | INHALATION_SOLUTION | Freq: Three times a day (TID) | RESPIRATORY_TRACT | Status: DC
  Administered 2011-02-24: 0.63 mg via RESPIRATORY_TRACT
  Filled 2011-02-24 (×3): qty 3

## 2011-02-24 MED ORDER — SODIUM BICARBONATE 8.4 % IV SOLN: INTRAVENOUS | Status: DC

## 2011-02-24 MED ORDER — ASPIRIN 300 MG RE SUPP: 300.0000 mg | RECTAL | Status: AC

## 2011-02-24 MED ORDER — SODIUM BICARBONATE 8.4 % IV SOLN
INTRAVENOUS | Status: AC
  Administered 2011-02-24: 14:00:00
  Filled 2011-02-24: qty 50

## 2011-02-24 MED ORDER — CHLORHEXIDINE GLUCONATE 0.12 % MT SOLN
OROMUCOSAL | Status: AC
  Filled 2011-02-24: qty 15

## 2011-02-24 MED ORDER — AMIODARONE HCL IN DEXTROSE 360-4.14 MG/200ML-% IV SOLN
0.5000 mg/min | INTRAVENOUS | Status: DC
  Filled 2011-02-24 (×2): qty 200

## 2011-02-24 MED ORDER — CLONIDINE HCL 0.1 MG PO TABS
0.1000 mg | ORAL_TABLET | Freq: Three times a day (TID) | ORAL | Status: DC
  Filled 2011-02-24 (×3): qty 1

## 2011-02-24 MED ORDER — SODIUM BICARBONATE 8.4 % IV SOLN
100.0000 meq | Freq: Once | INTRAVENOUS | Status: DC
  Filled 2011-02-24: qty 100

## 2011-02-24 MED ORDER — FENTANYL BOLUS VIA INFUSION
50.0000 ug | Freq: Four times a day (QID) | INTRAVENOUS | Status: DC | PRN
  Filled 2011-02-24: qty 100

## 2011-02-24 MED ORDER — PANTOPRAZOLE SODIUM 40 MG IV SOLR
40.0000 mg | INTRAVENOUS | Status: DC
  Administered 2011-02-24 – 2011-02-25 (×2): 40 mg via INTRAVENOUS
  Filled 2011-02-24 (×2): qty 40

## 2011-02-24 MED ORDER — ASPIRIN 81 MG PO CHEW: 324.0000 mg | CHEWABLE_TABLET | ORAL | Status: AC

## 2011-02-24 MED ORDER — VANCOMYCIN HCL IN DEXTROSE 1-5 GM/200ML-% IV SOLN: 1000.0000 mg | Freq: Once | INTRAVENOUS | Status: DC

## 2011-02-24 MED FILL — Medication: Qty: 1 | Status: AC

## 2011-02-24 NOTE — Progress Notes (Signed)
INITIAL ADULT NUTRITION ASSESSMENT Date: 02/07/2011   Time: 12:40 PM  Reason for Assessment: Consult for TF recommendations  ASSESSMENT: Female 66 y.o.  Dx: Septic Shock, anemia  Hx:  Past Medical History  Diagnosis Date  . Hypertension   . 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 Vitamin B12 deficiency (dietary) anemia 12/27/2010  . Arthritis   . MI (myocardial infarction) 01/09/2011  . Aspiration pneumonia 01/17/2011  . VT (ventricular tachycardia) 01/28/2011  . Femur fracture, right   . Osteoporosis   . Cardiogenic shock   . Respiratory failure   . Atrial fibrillation     Related Meds:  Scheduled Meds:   . acetaminophen  975 mg Rectal Once  . amiodarone  150 mg Intravenous Once  . antiseptic oral rinse  15 mL Mouth Rinse QID  . aspirin  324 mg Oral NOW   Or  . aspirin  300 mg Rectal NOW  . chlorhexidine  15 mL Mouth Rinse BID  . chlorhexidine      . dextrose      . dextrose  25 g Intravenous Once  . dextrose      . hydrocortisone sod succinate (SOLU-CORTEF) injection  50 mg Intravenous Q8H  . levalbuterol  0.63 mg Nebulization TID  . metronidazole  500 mg Intravenous Once  . norepinephrine      . pantoprazole (PROTONIX) IV  40 mg Intravenous Q24H  . phytonadione (VITAMIN K) IV  10 mg Intravenous Once  . phytonadione (VITAMIN K) IV  2.5 mg Intravenous Once  . piperacillin-tazobactam (ZOSYN)  IV  2.25 g Intravenous Q6H  . piperacillin-tazobactam (ZOSYN)  IV  3.375 g Intravenous Once  . sodium bicarbonate      . sodium bicarbonate  100 mEq Intravenous Once  . sodium chloride  25 mL/kg Intravenous Once  . sodium chloride  4,000 mL Intravenous Once  . sodium chloride  500 mL Intravenous Once  . sodium chloride  500 mL Intravenous Once  . vancomycin  750 mg Intravenous Q48H  . DISCONTD: cloNIDine  0.1 mg Oral TID  . DISCONTD: phytonadione  5 mg Oral To Major  . DISCONTD: sodium bicarbonate  100 mEq Intravenous Once  . DISCONTD: vancomycin  1,000 mg  Intravenous Once   Continuous Infusions:   . sodium chloride 100 mL/hr at 02/10/2011 1610  . amiodarone (NEXTERONE PREMIX) 360 mg/200 mL dextrose 1 mg/min (02/08/2011 0450)   And  . amiodarone (NEXTERONE PREMIX) 360 mg/200 mL dextrose    . fentaNYL infusion INTRAVENOUS    . midazolam (VERSED) infusion    . norepinephrine (LEVOPHED) Adult infusion 20 mcg/min (02/18/2011 1015)  . norepinephrine (LEVOPHED) Adult infusion 40 mcg/min (02/25/2011 0600)  .  sodium bicarbonate infusion 100 mL/hr at 02/15/2011 0551  . vasopressin (PITRESSIN) infusion - *FOR SHOCK* 0.03 Units/min (01/28/2011 0800)  . DISCONTD: norepinephrine (LEVOPHED) Adult infusion 40 mcg/min (02/19/2011 0730)  . DISCONTD:  sodium bicarbonate infusion     PRN Meds:.sodium chloride, sodium chloride, fentaNYL, midazolam   Ht: 5' (152.4 cm)  Wt: 107 lb 9.4 oz (48.8 kg)  Ideal Wt: 45.5 kg % Ideal Wt: 107%  Usual Wt: 75-90 lb per daughter; recent weight loss due to frequent hospitalizations over the past few months. % Usual Wt: 119%  Body mass index is 21.01 kg/(m^2).  Normal weight  Food/Nutrition Related Hx: Ate regular foods PTA per daughter, but some liquids had to be thickened.  Labs:  CMP     Component Value Date/Time  NA 138 02/13/2011 0440   K 4.0 02/15/2011 0440   CL 101 02/23/2011 0440   CO2 12* 02/23/2011 0440   GLUCOSE 59* 02/08/2011 0440   BUN 20 02/08/2011 0440   CREATININE 2.15* 02/11/2011 0440   CALCIUM 9.3 02/03/2011 0440   PROT 3.0* 02/03/2011 0440   ALBUMIN 0.9* 02/03/2011 0440   AST 215* 01/29/2011 0440   ALT 56* 01/29/2011 0440   ALKPHOS 258* 02/01/2011 0440   BILITOT 0.5 02/20/2011 0440   GFRNONAA 23* 02/22/2011 0440   GFRAA 26* 02/03/2011 0440    CBG (last 3)   Basename 02/04/2011 1213 02/21/2011 0811 02/14/2011 0413  GLUCAP 68* 120* 109*     Intake/Output Summary (Last 24 hours) at 02/23/2011 1246 Last data filed at 02/17/2011 1232  Gross per 24 hour  Intake 5887.8 ml  Output     17 ml  Net  5870.8 ml    Diet Order: NPO  IVF:    sodium chloride Last Rate: 100 mL/hr at 02/23/2011 4098  amiodarone (NEXTERONE PREMIX) 360 mg/200 mL dextrose Last Rate: 1 mg/min (02/14/2011 0450)  And   amiodarone (NEXTERONE PREMIX) 360 mg/200 mL dextrose   fentaNYL infusion INTRAVENOUS   midazolam (VERSED) infusion   norepinephrine (LEVOPHED) Adult infusion Last Rate: 20 mcg/min (02/03/2011 1015)  norepinephrine (LEVOPHED) Adult infusion Last Rate: 40 mcg/min (02/23/2011 0600)  sodium bicarbonate infusion Last Rate: 100 mL/hr at 01/28/2011 0551  vasopressin (PITRESSIN) infusion - *FOR SHOCK* Last Rate: 0.03 Units/min (02/01/2011 0800)  DISCONTD: norepinephrine (LEVOPHED) Adult infusion Last Rate: 40 mcg/min (02/03/2011 0730)  DISCONTD:  sodium bicarbonate infusion     Estimated Nutritional Needs:   Kcal: 1191-4782 Protein: 70-80 grams Fluid: 1.6-1.7 liters  Received consult for TF recommendations; per RN, enteral feeding tube not in place at this time.    NUTRITION DIAGNOSIS: -Inadequate oral intake (NI-2.1).  Status: Ongoing  RELATED TO: inability to eat   AS EVIDENCED BY: NPO status  MONITORING/EVALUATION(Goals): Goal:  Start TF if unable to extubate and begin PO diet within the next 24 hours; TF to meet 90-100% of estimated needs. Monitor:  TF initiation/tolerance, labs, weight trend.  EDUCATION NEEDS: -Education not appropriate at this time  INTERVENTION: Recommend place enteral feeding tube and begin TF with Jevity 1.2 at 20 ml/h, increase by 10 ml every 4 hours to goal rate of 55 ml/h to provide 1584 kcals, 73 grams protein, 1069 ml free water daily.  Dietitian #:  [phone]  DOCUMENTATION CODES Per approved criteria  -Not Applicable    Hettie Holstein 02/05/2011, 12:40 PM

## 2011-02-24 NOTE — ED Notes (Signed)
Dr. Bebe Shaggy unable to obtain central line access -- moved to right femoral site.  RN at bedside to assist.

## 2011-02-24 NOTE — Procedures (Signed)
Arterial Catheter Insertion Procedure Note [name] [id number] March 18, 1945  Procedure: Insertion of Arterial Catheter  Indications: Blood pressure monitoring  Procedure Details Consent: Unable to obtain consent because of emergent medical necessity. Time Out: Verified patient identification, verified procedure, site/side was marked, verified correct patient position, special equipment/implants available, medications/allergies/relevent history reviewed, required imaging and test results available.  Performed  Maximum sterile technique was used including antiseptics, cap, gown and mask. Skin prep: Chlorhexidine; local anesthetic administered 22 gauge catheter was inserted into left radial artery using the Seldinger technique.  Evaluation Blood flow poor; BP tracing poor. Complications: No apparent complications.   [name] 02/09/2011

## 2011-02-24 NOTE — ED Notes (Addendum)
Pt arrived via [facility] c/o SOB and in tripod position. Pt looks lethargic. Pt alert and can talk. [facility] staff stated BP 86/58 per EMS. EMS 12 WNL

## 2011-02-24 NOTE — Procedures (Signed)
Name: Serena Petterson MRN: [id number] DOB: 1945-03-15  DOS:   PROCEDURE NOTE  Procedure:  Endotracheal intubation.  Indication:  Acute respiratory failure/Cardiac arrest.  Consent:  Consent was implied due to the emergency nature of the procedure.  Anesthesia:  Patient was in cardiac arrest.  Procedure summary:  Appropriate equipment was assembled. The patient was identified as Jesse Fall and safety timeout was performed. The patient was placed supine, with head in sniffing position. After adequate level of anesthesia was achieved, a Mac size 4 blade was inserted into the oropharynx and the vocal cords were visualized. A 7.5 endotracheal tube was inserted without difficulty and visualized going through the vocal cords. The stylette was removed and cuff inflated. Colorimetric change was noted on the CO2 meter. Breath sounds were heard over both lung fields equally. Post procedure chest xray was ordered.  Complications:  No immediate complications were noted.  Hemodynamic parameters and oxygenation remained stable throughout the procedure.    Catha Brow, MD  01/31/2011, 4:41 AM

## 2011-02-24 NOTE — ED Notes (Signed)
Still waiting for Vitamin K to be delivered from pharmacy at this time; preparing for central line placement, per EDP.

## 2011-02-24 NOTE — ED Notes (Addendum)
Admitting MD at bedside; MD putting orders in computer now.  Critical Care MD states that he wants 6 liters of normal saline bolus (with pressure bags).  Patient has had a total of 1 liter normal saline bolus (through R hand peripheral line) at this time; critical care MD aware.  Patient continues to tolerate Venturi mask; O2 99%.  Blood pressure 78 systolic at this time.

## 2011-02-24 NOTE — Progress Notes (Signed)
Name: Dazhane Villagomez MRN: [id number] DOB: [birth year]  ELECTRONIC ICU PHYSICIAN NOTE  Problem:  Septic shock, anemia, hgb 6.4  Intervention:  Transfuse to hgb > 9 per septic shock protocol  Sandrea Hughs 02/06/2011, 6:51 AM

## 2011-02-24 NOTE — ED Notes (Signed)
Patient switched from BiPAP machine to Venturi mask (at 12 liters; 40%), per respiratory.  EDP OKed switch.  Patient tolerating well; O2 at 98%.  Will continue to monitor.

## 2011-02-24 NOTE — ED Notes (Signed)
Received Critical Lab Value -- INR -- 7.4; Dr. Bebe Shaggy notified.

## 2011-02-24 NOTE — ED Notes (Signed)
Another lab tech at bedside attempting to obtain blood culture specimens.

## 2011-02-24 NOTE — ED Notes (Signed)
Withholding aspirin order at this time due to patient's INR value.

## 2011-02-24 NOTE — ED Notes (Signed)
Upon transfer to 2100 bed, patient gasping for air and becoming lethargic.  Bloody leakage noted from nostrils and central line site.  Critical care MD and respiratory therapist at bedside upon arrival to floor, along with several RNs and nurse techs.  Upon movement from stretcher to 2100 bed, patient becomes unresponsive; heart rhythm went from normal sinus rhythm to asystole.  Code Blue called; CPR started.

## 2011-02-24 NOTE — Procedures (Addendum)
Central Venous Catheter Insertion Procedure Note Der Gagliano [id number] Oct 16, 1945  Procedure: Insertion of a Dialysis Catheter. Indications: Dialysis access.  Procedure Details Consent: Risks of procedure as well as the alternatives and risks of each were explained to the (patient/caregiver).  Consent for procedure obtained. Time Out: Verified patient identification, verified procedure, site/side was marked, verified correct patient position, special equipment/implants available, medications/allergies/relevent history reviewed, required imaging and test results available.  Performed  Maximum sterile technique was used including antiseptics, cap, gloves, gown, hand hygiene, mask and sheet. Skin prep: Chlorhexidine; local anesthetic administered A antimicrobial bonded/coated triple lumen catheter was placed in the left internal jugular vein using the Seldinger technique.  Evaluation Blood flow good Complications: No apparent complications Patient did tolerate procedure well. Chest X-ray ordered to verify placement.  CXR: pending.  U/S used in placement, picture in chart.  Rosalio Catterton 02/16/2011, 1:36 PM

## 2011-02-24 NOTE — ED Notes (Signed)
CBG 15; Dr. Bebe Shaggy notified.

## 2011-02-24 NOTE — ED Notes (Signed)
EDP, X-ray, respiratory, RNs, lab, and nurse tech at bedside.

## 2011-02-24 NOTE — Progress Notes (Signed)
Chaplain received Code Blue page for patient [name] in room 2106 at 4:15 a.m.  When Chaplain arrived at 4:21 a.m., medical staff said patient was stable, and that her one family member had gone home and would return in the morning.  Chaplain prayed a silent prayer for patient.  For follow-up, I refer patient to the 2100 unit Chaplain.

## 2011-02-24 NOTE — Consult Note (Signed)
Referring Provider: No ref. provider found Primary Care Physician:  Leanna Sato, MD, MD Primary Nephrologist:  Dr. Hyman Hopes  Reason for Consultation:  Acute renal failure, metabolic acidosis and shock  [id number] is a 66 y/o F with PMHx of HTN and fracture femur in December 2012 as well as femoral DVT.Pt then had an episode of torsades on the floor and code blue was called She had a prolonged hospitalization after the arrest and discharged to a nursing facility. She was readmitted yesterday with acute respiratory failure and shock multiorgan failure and pneumonia. She has had 6 L fluid is edematous and intubated and sedated.She has a profound metabolic acidosis and acute oliguric renal failure.  Past Medical History  Diagnosis Date  . Hypertension   . 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 Vitamin B12 deficiency (dietary) anemia 12/27/2010  . Arthritis   . MI (myocardial infarction) 01/09/2011  . Aspiration pneumonia 01/17/2011  . VT (ventricular tachycardia) 01/28/2011  . Femur fracture, right   . Osteoporosis   . Cardiogenic shock   . Respiratory failure   . Atrial fibrillation     Past Surgical History  Procedure Date  . Abdominal hysterectomy     06/2010  . Femur im nail 01/03/2011    Procedure: INTRAMEDULLARY (IM) RETROGRADE FEMORAL NAILING;  Surgeon: Budd Palmer;  Location: [facility] OR;  Service: Orthopedics;  Laterality: Right;  Right IM Retrograde Femoral Nailing    Prior to Admission medications   Medication Sig Start Date End Date Taking? Authorizing Provider  amiodarone (PACERONE) 200 MG tablet Take 1 tablet (200 mg total) by mouth daily. 02/07/11 02/07/12 Yes Canary Brim, NP  carvedilol (COREG) 6.25 MG tablet Take 6.25 mg by mouth 2 (two) times daily with a meal.   Yes Historical Provider, MD  cyanocobalamin (,VITAMIN B-12,) 1000 MCG/ML injection Inject 1,000 mcg into the muscle every 7 (seven) days.   Yes Historical Provider, MD  furosemide (LASIX) 40 MG tablet  Take 1 tablet (40 mg total) by mouth daily. 02/07/11 02/07/12 Yes Canary Brim, NP  levalbuterol (XOPENEX) 0.63 MG/3ML nebulizer solution Take 3 mLs (0.63 mg total) by nebulization 3 (three) times daily. 02/07/11 02/07/12 Yes Canary Brim, NP  levalbuterol (XOPENEX) 0.63 MG/3ML nebulizer solution Take 3 mLs (0.63 mg total) by nebulization every 3 (three) hours as needed for wheezing or shortness of breath. 02/07/11 02/07/12 Yes Canary Brim, NP  rosuvastatin (CRESTOR) 10 MG tablet Take 10 mg by mouth daily. 02/07/11 02/07/12 Yes Canary Brim, NP  spironolactone (ALDACTONE) 25 MG tablet Take 1 tablet (25 mg total) by mouth 2 (two) times daily. 02/07/11 02/07/12 Yes Canary Brim, NP  warfarin (COUMADIN) 2.5 MG tablet Take 1 tablet (2.5 mg total) by mouth daily at 6 PM. 02/07/11 02/07/12 Yes Canary Brim, NP  feeding supplement (ENSURE) PUDG Take 1 Container by mouth 3 (three) times daily with meals. 02/07/11   Canary Brim, NP  feeding supplement (PRO-STAT SUGAR FREE 64) LIQD Take 30 mLs by mouth 2 (two) times daily at 10 AM and 5 PM. 02/07/11   Canary Brim, NP    Current Facility-Administered Medications  Medication Dose Route Frequency Provider Last Rate Last Dose  . sodium chloride 0.9 % bolus 1,453 mL  25 mL/kg Intravenous Once Bethel Born, MD       Followed by  . 0.9 %  sodium chloride infusion   Intravenous Continuous Bethel Born, MD 100 mL/hr at 02/23/2011 0981    . 0.9 %  sodium chloride infusion  250  mL Intravenous PRN Bethel Born, MD      . 0.9 %  sodium chloride infusion  250 mL Intravenous PRN Bethel Born, MD      . acetaminophen (TYLENOL) suppository 975 mg  975 mg Rectal Once Joya Gaskins, MD   975 mg at 03/10/11 0157  . amiodarone (NEXTERONE) 1.8 mg/mL load via infusion 150 mg  150 mg Intravenous Once Catha Brow, MD       And  . amiodarone (NEXTERONE PREMIX) 360 mg/200 mL dextrose IV infusion  1 mg/min Intravenous Continuous Catha Brow, MD 33.3 mL/hr at 03-10-11 0450 1 mg/min at  03/10/2011 0450   And  . amiodarone (NEXTERONE PREMIX) 360 mg/200 mL dextrose IV infusion  0.5 mg/min Intravenous Continuous Catha Brow, MD      . antiseptic oral rinse (BIOTENE) solution 15 mL  15 mL Mouth Rinse QID Sandrea Hughs, MD   15 mL at Mar 10, 2011 1200  . aspirin chewable tablet 324 mg  324 mg Oral NOW Bethel Born, MD       Or  . aspirin suppository 300 mg  300 mg Rectal NOW Bethel Born, MD      . chlorhexidine (PERIDEX) 0.12 % solution 15 mL  15 mL Mouth Rinse BID Sandrea Hughs, MD   15 mL at 10-Mar-2011 0809  . chlorhexidine (PERIDEX) 0.12 % solution           . dextrose 5 % solution           . dextrose 50 % solution 25 g  25 g Intravenous Once Joya Gaskins, MD   25 g at 03-10-2011 0226  . dextrose 50 % solution        50 mL at Mar 10, 2011 1229  . fentaNYL (SUBLIMAZE) 10 mcg/mL in sodium chloride 0.9 % 250 mL infusion  50-300 mcg/hr Intravenous Titrated Amanjot Sidhu, MD       And  . fentaNYL (SUBLIMAZE) bolus via infusion 50-100 mcg  50-100 mcg Intravenous Q6H PRN Amanjot Sidhu, MD      . heparin 1000 UNIT/ML injection           . heparin injection 1,000-6,000 Units  1,000-6,000 Units CRRT PRN Garnetta Buddy, MD      . hydrocortisone sodium succinate (SOLU-CORTEF) injection 50 mg  50 mg Intravenous Q8H Amanjot Sidhu, MD      . levalbuterol (XOPENEX) nebulizer solution 0.63 mg  0.63 mg Nebulization Q6H Sandrea Hughs, MD      . metroNIDAZOLE (FLAGYL) IVPB 500 mg  500 mg Intravenous Once Bethel Born, MD   500 mg at March 10, 2011 0815  . midazolam (VERSED) 1 mg/mL in sodium chloride 0.9 % 50 mL infusion  2-10 mg/hr Intravenous Titrated Melida Quitter, MD       And  . midazolam (VERSED) bolus via infusion 1-2 mg  1-2 mg Intravenous Q2H PRN Melida Quitter, MD      . norepinephrine (LEVOPHED) 1 MG/ML injection        2 mcg at 03/10/11 0340  . norepinephrine (LEVOPHED) 16,000 mcg in dextrose 5 % 250 mL infusion  2-50 mcg/min Intravenous Titrated Sandrea Hughs, MD 23.4 mL/hr at 03-10-11 1400 25  mcg/min at 03/10/2011 1400  . norepinephrine (LEVOPHED) 4,000 mcg in dextrose 5 % 250 mL infusion  2-50 mcg/min Intravenous Titrated Sandrea Hughs, MD 150 mL/hr at 03/10/11 0600 40 mcg/min at Mar 10, 2011 0600  . pantoprazole (PROTONIX) injection 40 mg  40 mg Intravenous Q24H Bethel Born, MD   40 mg at Mar 10, 2011  1229  . phenylephrine (NEO-SYNEPHRINE) 40,000 mcg in dextrose 5 % 250 mL infusion  30-200 mcg/min Intravenous Titrated Koren Bound, MD 18.8 mL/hr at 02/09/2011 1400 50 mcg/min at 01/29/2011 1400  . phytonadione (VITAMIN K) 10 mg in dextrose 5 % 50 mL IVPB  10 mg Intravenous Once Melida Quitter, MD   10 mg at 02/21/2011 1359  . phytonadione (VITAMIN K) 2.5 mg in dextrose 5 % 50 mL IVPB  2.5 mg Intravenous Once Bethel Born, MD   2.5 mg at 02/04/2011 0457  . piperacillin-tazobactam (ZOSYN) IVPB 3.375 g  3.375 g Intravenous Once Joya Gaskins, MD   3.375 g at 02/12/2011 0204  . piperacillin-tazobactam (ZOSYN) IVPB 3.375 g  3.375 g Intravenous Q6H Drake Leach Rumbarger, PHARMD      . prismasol BGK 4/2.5 5,000 mL dialysis solution   CRRT Q2H Garnetta Buddy, MD      . sodium bicarbonate 1 mEq/mL injection        100 mEq at 02/16/2011 0557  . sodium bicarbonate 1 mEq/mL injection           . sodium bicarbonate 150 mEq in dextrose 5 % 1,000 mL infusion   Intravenous Continuous Sandrea Hughs, MD 100 mL/hr at 02/14/2011 0551    . sodium bicarbonate 150 mEq in sterile water 1,000 mL infusion   CRRT Continuous Garnetta Buddy, MD      . sodium bicarbonate 150 mEq in sterile water 1,000 mL infusion   CRRT Continuous Garnetta Buddy, MD      . sodium bicarbonate injection 100 mEq  100 mEq Intravenous Once Sandrea Hughs, MD      . sodium chloride 0.9 % bolus 4,000 mL  4,000 mL Intravenous Once Bethel Born, MD   4,000 mL at 02/03/2011 0400  . sodium chloride 0.9 % bolus 500 mL  500 mL Intravenous Once Joya Gaskins, MD   500 mL at 02/09/2011 0132  . sodium chloride 0.9 % bolus 500 mL  500 mL Intravenous Once Joya Gaskins,  MD   500 mL at 02/16/2011 0215  . sodium chloride 0.9 % injection           . sodium chloride 0.9 % primer fluid for CRRT   CRRT PRN Garnetta Buddy, MD      . vancomycin (VANCOCIN) 500 mg in sodium chloride 0.9 % 100 mL IVPB  500 mg Intravenous Q24H Drake Leach Rumbarger, PHARMD      . vasopressin (PITRESSIN) 50 Units in sodium chloride 0.9 % 250 mL infusion  0.03 Units/min Intravenous Continuous Catha Brow, MD 9 mL/hr at 02/04/2011 0800 0.03 Units/min at 02/15/2011 0800  . DISCONTD: cloNIDine (CATAPRES) tablet 0.1 mg  0.1 mg Oral TID Sandrea Hughs, MD      . DISCONTD: levalbuterol Pauline Aus) nebulizer solution 0.63 mg  0.63 mg Nebulization TID Bethel Born, MD   0.63 mg at 02/16/2011 1216  . DISCONTD: norepinephrine (LEVOPHED) 4,000 mcg in dextrose 5 % 250 mL infusion  2-50 mcg/min Intravenous Titrated Sandrea Hughs, MD 150 mL/hr at 02/08/2011 0730 40 mcg/min at 02/15/2011 0730  . DISCONTD: phenylephrine (NEO-SYNEPHRINE) 10,000 mcg in dextrose 5 % 250 mL infusion  30-200 mcg/min Intravenous Continuous Koren Bound, MD      . DISCONTD: phytonadione (VITAMIN K) tablet 5 mg  5 mg Oral To Major Joya Gaskins, MD      . DISCONTD: piperacillin-tazobactam (ZOSYN) IVPB 2.25 g  2.25 g Intravenous Q6H Sandrea Hughs, MD   2.25 g  at 02/05/2011 1426  . DISCONTD: sodium bicarbonate 150 mEq in dextrose 5 % 1,000 mL infusion   Intravenous Continuous Catha Brow, MD      . DISCONTD: sodium bicarbonate injection 100 mEq  100 mEq Intravenous Once Catha Brow, MD      . DISCONTD: vancomycin (VANCOCIN) 750 mg in sodium chloride 0.9 % 150 mL IVPB  750 mg Intravenous Q48H Sandrea Hughs, MD   750 mg at 02/22/2011 0865  . DISCONTD: vancomycin (VANCOCIN) IVPB 1000 mg/200 mL premix  1,000 mg Intravenous Once Joya Gaskins, MD        Allergies as of 02/12/2011  . (No Known Allergies)    History reviewed. No pertinent family history.  History   Social History  . Marital Status: Single    Spouse Name: N/A    Number of  Children: N/A  . Years of Education: N/A   Occupational History  . Not on file.   Social History Main Topics  . Smoking status: Never Smoker   . Smokeless tobacco: Never Used  . Alcohol Use: No  . Drug Use: No  . Sexually Active: No   Other Topics Concern  . Not on file   Social History Narrative  . No narrative on file    Review of Systems: Unobtainable due to critically ill patient on pressors and ventilator.  Physical Exam: Vital signs in last 24 hours: Temp:  [97.7 F (36.5 C)-102.9 F (39.4 C)] 100 F (37.8 C) (01/28 1415) Pulse Rate:  [29-103] 37  (01/28 1400) Resp:  [20-38] 35  (01/28 1415) BP: (55-122)/(23-76) 82/58 mmHg (01/28 1415) SpO2:  [5 %-100 %] 89 % (01/28 1400) Arterial Line BP: (65-124)/(43-90) 92/54 mmHg (01/28 1415) FiO2 (%):  [40 %-100 %] 100 % (01/28 1415) Weight:  [48.8 kg (107 lb 9.4 oz)] 48.8 kg (107 lb 9.4 oz) (01/28 0500)   General:   Alert, chronically ill Head:  Normocephalic and atraumatic. Eyes:  Sclera clear, no icterus.   Conjunctiva pale Nose:  No deformity, discharge,  or lesions. Mouth: normal. Neck:  Supple; no masses or thyromegaly. JVP not elevated Lungs:  Intubated rhonchi Heart:  Regular rate and rhythm; no murmurs, clicks, rubs,  or gallops. Abdomen:  Soft, but diminished bowel sounds  Msk:  Symmetrical without gross deformities. Normal posture. Pulses:  No carotid, renal, femoral bruits. DP and PT symmetrical and equal Extremities:  Right edema 3 + Neurologic:  sedated Skin:  Intact without significant lesions or rashes.   Intake/Output from previous day: 01/27 0701 - 01/28 0700 In: 3827.2 [I.V.:3612.2; Blood:15; IV Piggyback:200] Out: 17 [Urine:17] Intake/Output this shift: Total I/O In: 3118.9 [I.V.:1156.1; Blood:1752.8; IV Piggyback:210] Out: 0   Lab Results:  Basename 02/15/2011 1100 01/30/2011 0440 02/25/2011 0121  WBC 26.2* 29.4* 31.7*  HGB 10.7* 6.4* 10.6*  HCT 33.3* 20.9* 32.2*  PLT 204 231 416*    BMET  Basename 02/10/2011 0440 02/16/2011 0121  NA 138 129*  K 4.0 5.4*  CL 101 82*  CO2 12* 15*  GLUCOSE 59* <20*  BUN 20 23  CREATININE 2.15* 2.61*  CALCIUM 9.3 11.0*  PHOS 5.2* --   LFT  Basename 02/11/2011 0440  PROT 3.0*  ALBUMIN 0.9*  AST 215*  ALT 56*  ALKPHOS 258*  BILITOT 0.5  BILIDIR --  IBILI --   PT/INR  Basename 02/02/2011 1100 02/11/2011 0440  LABPROT 29.0* >90.0*  INR 2.69* >10.00*   Hepatitis Panel No results found for this basename: HEPBSAG,HCVAB,HEPAIGM,HEPBIGM in the last  72 hours  Studies/Results: Dg Chest Port 1 View  02/23/2011  *RADIOLOGY REPORT*  Clinical Data: Post line placement.  PORTABLE CHEST - 1 VIEW  Comparison: 02/17/2011  Findings: Left vas cath has been placed.  The tip is at the cavoatrial junction.  No pneumothorax.  Endotracheal tube is stable.  Patchy bilateral airspace disease is unchanged.  Mild cardiomegaly.  IMPRESSION: Left vas cath tip at the cavoatrial junction.  No pneumothorax. Otherwise no change.  Original Report Authenticated By: Cyndie Chime, M.D.   Dg Chest Port 1 View  02/16/2011  *RADIOLOGY REPORT*  Clinical Data: Post CPR  PORTABLE CHEST - 1 VIEW  Comparison: 02/22/2011 at 0058 hours  Findings: Shallow inspiration.  Heart size and pulmonary vascularity are normal.  There are bilateral perihilar infiltrates which have developed since the previous study, suggesting fluid overload.  Perihilar infiltrates on the right mass can be previously demonstrated focal area of consolidation.  No blunting of costophrenic angles.  No pneumothorax.  Fracture of the right anterolateral ninth rib.  Endotracheal tube was placed with tip about 4 cm above the carina.  IMPRESSION: Endotracheal tube placed with tip about 4 cm above the carina. Development of perihilar infiltration suggesting fluid overload or edema.  Right lower rib fracture.  Original Report Authenticated By: Marlon Pel, M.D.   Dg Chest Port 1 View  02/21/2011  *RADIOLOGY  REPORT*  Clinical Data: Shortness of breath.  Low blood pressure.  PORTABLE CHEST - 1 VIEW  Comparison: 02/05/2011  Findings: Interval development of rounded focus of increased density in the right mid lung probably representing focal consolidation.  Normal heart size and pulmonary vascularity.  No blunting of costophrenic angles.  No pneumothorax.  IMPRESSION: Developing density in the right mid lung suggesting focal consolidation.  Original Report Authenticated By: Marlon Pel, M.D.    Assessment/Plan: 1.Acute oliguric renal failure in the setting of shock and multiorgan failure. Pressors and ventilator dependent. I suspect sever ATN. Will need dialysis support with CVVHD  2.Shock pressors  3.Metabolic acidosis correct with IV bicarbonate  4.Antibiotics  Vancomycin and Zosyn  5. Coagulopathic  Shock and coumadin  6. Electrolytes stable   LOS: 0 Heena Woodbury W @TODAY @3 :15 PM

## 2011-02-24 NOTE — Progress Notes (Signed)
  Echocardiogram 2D Echocardiogram has been performed.  Cathie Beams Deneen 03/22/2011, 12:09 PM

## 2011-02-24 NOTE — Code Documentation (Signed)
Witnessed code at 410 am.  Patient was admitted from ED for sepsis, septic shock, acute respiratory failure and acute renal failure. Patient was noted to be unresponsive, agonal breathing and pulseless at 410 am when she just arrived at ICU 2100. Dr. Blinda Leatherwood was in the room and Code team arrived.ACLS code protocol initiated.  Patient was intubated and CPR initiated. Two Amp Epinephrine and one atropine given, two shocks delivered. IV Bicarb and Calcium were given. Pt regained pulse and heart rhythm of Sinus Tachycardia at 0418 am.  Billy Fischer, MD;  PCCM service; Mobile [phone]

## 2011-02-24 NOTE — ED Notes (Signed)
Lab at bedside to obtain blood specimens.

## 2011-02-24 NOTE — Progress Notes (Signed)
Placed patient on 40% venturi mask. Sp02=97% patient hypotensive at this time. Nurse and doctor is aware of situation

## 2011-02-24 NOTE — ED Notes (Signed)
Per EDP, central line okay to use without x-ray (due to femoral access).

## 2011-02-24 NOTE — Progress Notes (Signed)
PHARMACY - ANTIBIOTIC CONSULT  INITIAL NOTE  Pharmacy Consult for: Vancomycin, Zosyn  Indication: rule out pneumonia   Patient Data:   Allergies: No Known Allergies  Patient Measurements:   Height:5'0" Weight: 58 kg   Vital Signs: Temp:  [98.9 F (37.2 C)-102.9 F (39.4 C)] 100.9 F (38.3 C) (01/28 0348) Pulse Rate:  [87-103] 87  (01/28 0427) Resp:  [20-38] 20  (01/28 0427) BP: (59-122)/(35-76) 122/69 mmHg (01/28 0427) SpO2:  [98 %-100 %] 100 % (01/28 0427) FiO2 (%):  [40 %-100 %] 100 % (01/28 0427)  Intake/Output from previous day:  Intake/Output Summary (Last 24 hours) at 03/14/11 0508 Last data filed at Mar 14, 2011 0349  Gross per 24 hour  Intake   3000 ml  Output      0 ml  Net   3000 ml    Labs:  Basename 2011-03-14 0121  WBC 31.7*  HGB 10.6*  PLT 416*  LABCREA --  CREATININE 2.61*   The CrCl is unknown because both a height and weight (above a minimum accepted value) are required for this calculation. No results found for this basename: VANCOTROUGH:2,VANCOPEAK:2,VANCORANDOM:2,GENTTROUGH:2,GENTPEAK:2,GENTRANDOM:2,TOBRATROUGH:2,TOBRAPEAK:2,TOBRARND:2,AMIKACINPEAK:2,AMIKACINTROU:2,AMIKACIN:2, in the last 72 hours   Microbiology: No results found for this or any previous visit (from the past 720 hour(s)).  Medical History: Past Medical History  Diagnosis Date  . Hypertension   . 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 Vitamin B12 deficiency (dietary) anemia 12/27/2010  . Arthritis   . MI (myocardial infarction) 01/09/2011  . Aspiration pneumonia 01/17/2011  . VT (ventricular tachycardia) 01/28/2011  . Femur fracture, right   . Osteoporosis   . Cardiogenic shock   . Respiratory failure   . Atrial fibrillation     Scheduled Medications:     . acetaminophen  975 mg Rectal Once  . amiodarone  150 mg Intravenous Once  . aspirin  324 mg Oral NOW   Or  . aspirin  300 mg Rectal NOW  . dextrose      . dextrose  25 g Intravenous Once  .  norepinephrine      . phytonadione (VITAMIN K) IV  2.5 mg Intravenous Once  . phytonadione  5 mg Oral To Major  . piperacillin-tazobactam (ZOSYN)  IV  3.375 g Intravenous Once  . sodium chloride  4,000 mL Intravenous Once  . sodium chloride  500 mL Intravenous Once  . sodium chloride  500 mL Intravenous Once  . vancomycin  1,000 mg Intravenous Once      Assessment:  66 y.o. female admitted on 14-Mar-2011, with r/o pneumonia. Pharmacy consulted to manage vancomycin and Zosyn. Estimated CrCl of ~ 18 mL/min.    Goal of Therapy:  1. Vancomycin trough level 15-20 mcg/ml  Plan:  1. Zosyn 2.25gm IV Q6H.  2. Vancomycin 750mg  IV Q48H.  3. Will order vancomycin troughs as indicated.   Dineen Kid Thad Ranger, PharmD 03/14/11, 5:08 AM

## 2011-02-24 NOTE — ED Notes (Addendum)
Report given to 2100; admitting MD wants patient to be transported upstairs now.  Patient stable at this time; vital signs stable.  Patient still on Venturi mask (set at 12 liters); patient tolerating well -- 100% pulse ox; HR 86; blood pressure 93 systolic.  Patient placed on Zoll monitor.  Ambu bag and Zoll pads placed on stretcher.  Patient made aware that she is being moved upstairs; patient verbalizes understanding.  Respiratory at bedside; made aware that patient is being transported upstairs.  Preparing patient for transport.

## 2011-02-24 NOTE — Progress Notes (Signed)
Name: [name] MRN: [id number] DOB: 1945-09-19    LOS: 0  PCCM ADMISSION NOTE  History of Present Illness:  This patient is 66 year old F with PMF of HTN, DVT, MI, aspiration pneumonia, respiratory failure and cardiogenic shock who presents to the ED with hyperthermia, hypotension and shortness of breath. Patient was noted to have shortness of breath, hypoxia with O2 Sat in low 90's on 3L [place], and hypotensive BP at 86/58  at [facility] facility at the night of 1/27. Patient was brought to [facility] ED for further evaluation. Patient was placed on BiPAP for respiratory support. She was noted to have Temp. 102.9, Hypotensive 80's/50's, CBG 15 and INR 7.4. PCCM consulted and will assume care. Patient was emergently intubated overnight.  Lines / Drains: 1/28  Right femoral vein central line>>> 1/28 ETT >> 1/28 Foley>> 1/28 Right peripheral IV >>>  Cultures: 1/28 blood culture>>> 1/28 urine culture>>> 1/28 lactic acid >>>16.9 1/28 PCT >>>0.98  Antibiotics: 1/28 Vanc>>> 1/28 Zosyn>>>   Pressors/Drips 1/28 Levophed>>> 1/28 Vasopressin>>> 1/28 Bicarb gtt>>> 1/28 Amio gtt>>>  Tests / Events: 11/28 LE venous dopplers - Occlusive right femoropopliteal DVT  12/07 ORIF R femur fx  12/07 Code blue - torsades, CPR 12/08 Cath - Severe 95% thrombotic lesion mid RCA s/p bare metal stent. Severe LV dysfunction w/ inferior apical and distal anterior hypokinesis.  12/08 CT head: No acute abnormalities  12/08 Echo: Systolic function was moderately to severely reduced. EF  30% to 35%.  12/11 Reintubated for sudden onset resp distress  12/11 CTA chest: No evidence of pulmonary embolus. Moderate bilateral effusions with significant consolidation and atelectasis in both lungs. Perihilar infiltration suggesting edema. Indeterminate mass or fluid collection in the right supraclavicular region measuring about 3 x 2.2 cm.  12/14 Extubated  12/20 Acute resp distress, transfer to 3300  12/21  Intubated for aspiration pneumonia  12/21 Percutaneous tracheostomy 12/28 Transferred from SDU to ICU for trach site bleeding  12/31 Transferred to SDU. Failed FEES  01/28/11 Warfarin resumed  1/7 Downsize to #4, pending swallow eval  1/8 Swallow eval, passed 1/10 Discharged to [facility] 1/28 Transferred from nursing home for shortness of breath>>intubated, on pressors 1/28 CXR: perihilar infiltration suggesting fluid overload or edema, right lower rib fracture, right sided focal consolidation  Vital Signs: Temp:  [97.7 F (36.5 C)-102.9 F (39.4 C)] 98.1 F (36.7 C) (01/28 0745) Pulse Rate:  [29-103] 85  (01/28 0745) Resp:  [20-38] 29  (01/28 0745) BP: (55-122)/(23-76) 93/63 mmHg (01/28 0745) SpO2:  [97 %-100 %] 100 % (01/28 0700) Arterial Line BP: (122-123)/(51-56) 122/56 mmHg (01/28 0700) FiO2 (%):  [40 %-100 %] 79.6 % (01/28 0700) Weight:  [107 lb 9.4 oz (48.8 kg)] 107 lb 9.4 oz (48.8 kg) (01/28 0500) I/O last 3 completed shifts: In: 3735 [I.V.:3520; Blood:15; IV Piggyback:200] Out: -   Physical Examination: General:  Intubated, alert Neuro:  Sedated, moving extremities  Cardiovascular:  Irregularly irregular, no M/G/R Lungs:  B/L lung sounds diminished . Right side rales. Occasional wheezing. Abdomen:  Soft, BS x4. Musculoskeletal:  No edema Skin: No rash  Ventilator settings: Vent Mode:  [-] PRVC FiO2 (%):  [40 %-100 %] 79.6 % Set Rate:  [15 bmp] 15 bmp Vt Set:  [500 mL] 500 mL PEEP:  [5 cmH20] 5 cmH20 Plateau Pressure:  [27 cmH20] 27 cmH20  Labs and Imaging:   Lab 02/25/2011 1112 02/07/2011 0459 02/04/2011 0327 02/19/2011 0114 02/17/11 1456  PHART 7.133* 6.995* -- 7.472* --  PCO2ART 28.5* 32.9* -- 25.5* --  PO2ART 73.0* 151.0* -- 162.0* --  HCO3 9.5* 7.9* -- 18.7* --  TCO2 10 9 -- 19 27  O2SAT 89.0 98.0 60.7 100.0 --   Basic Metabolic Panel:  Basename 2011-03-25 0440 03/25/11 0121  NA 138 129*  K 4.0 5.4*  CL 101 82*  CO2 12* 15*  GLUCOSE 59*  <20*  BUN 20 23  CREATININE 2.15* 2.61*  CALCIUM 9.3 11.0*  MG 1.9 --  PHOS 5.2* --   CBC:  Basename 03/25/2011 1100 03-25-2011 0440 2011/03/25 0121  WBC 26.2* 29.4* --  NEUTROABS -- 14.4* 23.1*  HGB 10.7* 6.4* --  HCT 33.3* 20.9* --  MCV 86.0 84.6 --  PLT 204 231 --    Basename March 25, 2011 1100 03-25-2011 0440  LABPROT 29.0* >90.0*  INR 2.69* >10.00*    *RADIOLOGY REPORT*  Clinical Data: Shortness of breath. Low blood pressure.  PORTABLE CHEST - 1 VIEW  Comparison: 02/05/2011  Findings: Interval development of rounded focus of increased  density in the right mid lung probably representing focal  consolidation. Normal heart size and pulmonary vascularity. No  blunting of costophrenic angles. No pneumothorax.  IMPRESSION:  Developing density in the right mid lung suggesting focal  consolidation.    Assessment and Plan:  1. Sepsis and septic shock - 2/2 aspiration PNA. Previously intubated, and recent complicated hospitalization for similar situation.   Plan    - Sepsis protocol. Continue fluids, Levophed and Vasopressin    - fluid resucictation for a goal of CVP 8-12 and MAP>65    - Continue broad spectrum ABX treatment    - Start stress steroids  2. Acute respiratory failure - 2/2 shock and suspected aspiration PNA. Intubated.  Plan - continue full vent support - sedation protocol  - follow CXR and ABG as mentioned above   3. Renal Acute renal failure and high gap metabolic acidosis,  Baseline Cr 0.8. Patient presents with elevated Cr and gap metabolic acidosis. Patient was started on bicarb gtt but remains acidotic. Renal failure in setting of shock. No recent ACE-I use.      Plan:    - continue bicarb gtt until HD catheter placed and then start CVVHD, d/w Dr. Hyman Hopes of renal    - repeat abg and lactic acid   4. CVS  A fib -Patient has been on amiodarone and coumadin at home. Amio drip turned off due to access issues. Currently rate controlled and does not need to  be on continuous drip. Will hold amio for now. Supratherapeutic INR, s/p FFP and PRBCs transfusion.  Hx. of CHF with EF 30-35% in Dec. 12.   Plan: -Hold home meds (Lasix, Coreg, Spironolactone) in setting of hypotension  CAD s/p RCA BMS  Plan: -Hold antiplatelet/anticoagulation, anti-hypertensives in setting of shock, anemia, hypotension, and supratherapeutic INR  5. Endocrine Hypoglycemia - Patient is noted to have CBG of 15. D50 IV given to increase her CBG to 91  Plan  - CBG Q 2 hours   6. Heme   Supratherapeutic INR, on coumadin tx. S/P 2 units FFP, Vitamin K. Will give another dose of Vit K and another 2 units FFP prior to HD catheter insertion.  Lab Results  Component Value Date   INR >10.00* 03-25-11   INR 7.41* 03/25/2011   INR 1.90* 02/17/2011   Anemia - S/P 2 units PRBCs. Hgb improved after transfusion.   Lab Mar 25, 2011 1100 03/25/2011 0440 03/25/2011 0121 02/17/11 1456 02/17/11 1416  HGB 10.7*  6.4* 10.6* 10.9* 10.0*  HCT 33.3* 20.9* 32.2* 32.0* 30.8*   Plan: -Transfuse to hgb >9 per EGDT protocol -Follow coags and CBC   7. Goals of care: Very poor prognosis. This is patient's 4th intubation in <2 months, complicated by severe septic shock, severe acidosis, 2/2 recurrent aspiration pneumonia, supratherapeutic INR and anemia. Family updated at bedside. Will continue full aggressive medical management, with initiating dialysis to help clear acidosis. Will continue pressor and full vent support. In the setting of cardiac arrest, will not initiate chest compressions and will only consider cardioversion under certain circumstances, as discussed with patient's daughter.    Best practices / Disposition: -->ICU status under PCCM -->limited code - full aggressive management, no compressions -->NO VTE prohylaxis now with elevated INR -->protonix for GI Px -->Ventilator bundle -->diet: NPO   The patient is critically ill with multiple organ systems failure and requires  high complexity decision making for assessment and support, frequent evaluation and titration of therapies, application of advanced monitoring technologies and extensive interpretation of multiple databases. Critical Care Time devoted to patient care services described in this note is 45 minutes.  SIDHU,AMANJOT 03-23-2011, 8:01 AM  Pt seen and examined and database reviewed. I agree with above findings, assessment and plan.  The note above accurately reflects the care plan as discussed on AM rounds. Family updated @ bedside. i explained that Ms Shrewsbury is very critically ill and that once CRRT has been initiated, we will have "maxed out" the therapies that we have to offer. If she were to "fail" these efforts and suffer cardiac arrest, there would be no utility to undertaking resuscitative efforts as we would simply be "bringing her back to something that we are unable to treat in the first place". They understand the rationale for making her NCB in event of cardiac arrest. I have assured them that we will continue to provide full aggressive support otherwise.  Billy Fischer, MD;  PCCM service; [place] [phone]

## 2011-02-24 NOTE — ED Notes (Signed)
Calling report now. 

## 2011-02-24 NOTE — ED Notes (Signed)
Dr. Bebe Shaggy at bedside with ultrasound, central line cart, and central line kit.  RN to assist.

## 2011-02-24 NOTE — ED Notes (Signed)
Critical care MDs, respiratory, multiple RNs, rapid response RN, and multiple nurse techs at bedside during code.

## 2011-02-24 NOTE — Progress Notes (Signed)
CRITICAL VALUE ALERT  Critical value received: PT>90, INR>10  Date of notification:  02/07/2011  Time of notification:  05:30  Critical value read back:yes  Nurse who received alert:  J. Meredeth Ide RN [name]  MD notified (1st page):  Blinda Leatherwood  Time of first page: 05:40, told in person on unit  MD notified (2nd page):  Time of second page:  Responding MD:  Brynda Rim  Time MD responded:  05:40

## 2011-02-24 NOTE — ED Notes (Signed)
Arterial blood gas obtained by respiratory.

## 2011-02-24 NOTE — ED Notes (Signed)
Patient starting to sound "gurgly" - wet breath sounds after four liters of fluid; critical care MD at bedside and aware.  5th and 6th normal saline bolus liters on pressure bags hung, per MD request.  Herbert Seta, RN, Inetta Fermo, RN, respiratory, critical care MD, EDP, and EMTs at bedside.

## 2011-02-24 NOTE — ED Provider Notes (Signed)
History     CSN: [id number]  Arrival date & time 2011-03-14  0044   First MD Initiated Contact with Patient March 14, 2011 0051      Chief Complaint  Patient presents with  . Shortness of Breath     Patient is a 66 y.o. female presenting with shortness of breath. The history is provided by the patient and the EMS personnel. The history is limited by the condition of the patient.  Shortness of Breath  The current episode started today. The onset was sudden. The problem occurs continuously. The problem has been rapidly worsening. The problem is severe. The symptoms are relieved by nothing. The symptoms are aggravated by nothing. Associated symptoms include shortness of breath.  Pt presents from nursing facility for acute SOB within the past two hours EMS reports pt was tachypneic and in distress on their arrival She did have recent hospitalization No other details are known  Past Medical History  Diagnosis Date  . Hypertension   . 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 Vitamin B12 deficiency (dietary) anemia 12/27/2010  . Arthritis   . MI (myocardial infarction) 01/09/2011  . Aspiration pneumonia 01/17/2011  . VT (ventricular tachycardia) 01/28/2011  . Femur fracture, right   . Osteoporosis   . Cardiogenic shock   . Respiratory failure   . Atrial fibrillation     Past Surgical History  Procedure Date  . Abdominal hysterectomy     06/2010  . Femur im nail 01/03/2011    Procedure: INTRAMEDULLARY (IM) RETROGRADE FEMORAL NAILING;  Surgeon: Budd Palmer;  Location: [facility] OR;  Service: Orthopedics;  Laterality: Right;  Right IM Retrograde Femoral Nailing    History reviewed. No pertinent family history.  History  Substance Use Topics  . Smoking status: Never Smoker   . Smokeless tobacco: Never Used  . Alcohol Use: No    OB History    Grav Para Term Preterm Abortions TAB SAB Ect Mult Living   1 1 1       1       Review of Systems  Unable to perform ROS: Unstable  vital signs  Respiratory: Positive for shortness of breath.     Allergies  Review of patient's allergies indicates no known allergies.  Home Medications   Current Outpatient Rx  Name Route Sig Dispense Refill  . AMIODARONE HCL 200 MG PO TABS Oral Take 1 tablet (200 mg total) by mouth daily.    Marland Kitchen CARVEDILOL 6.25 MG PO TABS Oral Take 6.25 mg by mouth 2 (two) times daily with a meal.    . CYANOCOBALAMIN 1000 MCG/ML IJ SOLN Intramuscular Inject 1,000 mcg into the muscle every 7 (seven) days.    . ENSURE PUDDING PO PUDG Oral Take 1 Container by mouth 3 (three) times daily with meals.    Marland Kitchen PRO-STAT 64 PO LIQD Oral Take 30 mLs by mouth 2 (two) times daily at 10 AM and 5 PM. 900 mL   . FUROSEMIDE 40 MG PO TABS Oral Take 1 tablet (40 mg total) by mouth daily. 30 tablet   . LEVALBUTEROL HCL 0.63 MG/3ML IN NEBU Nebulization Take 3 mLs (0.63 mg total) by nebulization 3 (three) times daily. 3 mL   . LEVALBUTEROL HCL 0.63 MG/3ML IN NEBU Nebulization Take 3 mLs (0.63 mg total) by nebulization every 3 (three) hours as needed for wheezing or shortness of breath. 3 mL   . ROSUVASTATIN CALCIUM 10 MG PO TABS Oral Take 10 mg by mouth daily.    Marland Kitchen  SPIRONOLACTONE 25 MG PO TABS Oral Take 1 tablet (25 mg total) by mouth 2 (two) times daily.    . WARFARIN SODIUM 2.5 MG PO TABS Oral Take 1 tablet (2.5 mg total) by mouth daily at 6 PM.      Pulse 97  Resp 38  SpO2 98% BP 93/67  Pulse 98  Temp(Src) 102.9 F (39.4 C) (Other (Comment))  Resp 30  SpO2 98%   Physical Exam  CONSTITUTIONAL: ill appearing HEAD AND FACE: Normocephalic/atraumatic EYES: EOMI ENMT: Mucous membranes, healed trach scar noted NECK: supple no meningeal signs SPINE:entire spine nontender CV: tachycardic, no loud murmurs LUNGS: tachypneic, coarse BS noted bilaterally, but she is able to speak to me ABDOMEN: soft, nontender, no rebound or guarding GU:no cva tenderness NEURO: Pt is awake/alert, moves all extremitiesx4 EXTREMITIES:  pulses normal, full ROM, edema noted the lower extremities SKIN: warm, color normal PSYCH: no abnormalities of mood noted  ED Course  CENTRAL LINE Date/Time: 02/16/2011 3:31 AM Performed by: Joya Gaskins Authorized by: Joya Gaskins Consent: Verbal consent obtained. Written consent obtained. Risks and benefits: risks, benefits and alternatives were discussed Consent given by: patient (daughter) Patient understanding: patient states understanding of the procedure being performed Patient identity confirmed: verbally with patient and arm band Time out: Immediately prior to procedure a "time out" was called to verify the correct patient, procedure, equipment, support staff and site/side marked as required. Indications: vascular access Anesthesia: local infiltration Local anesthetic: lidocaine 1% without epinephrine Anesthetic total: 3 ml Patient sedated: no Preparation: skin prepped with 2% chlorhexidine Skin prep agent dried: skin prep agent completely dried prior to procedure Sterile barriers: all five maximum sterile barriers used - cap, mask, sterile gown, sterile gloves, and large sterile sheet Hand hygiene: hand hygiene performed prior to central venous catheter insertion Location details: right femoral Site selection rationale: unable to gain acces in IJ and INR >7 and did not want risk hemtaoma/carotid puncture Patient position: flat Catheter type: triple lumen Catheter size: 7 Fr Pre-procedure: landmarks identified Ultrasound guidance: yes Number of attempts: 2 Successful placement: yes Post-procedure: line sutured and dressing applied Assessment: blood return through all parts and free fluid flow Patient tolerance: Patient tolerated the procedure well with no immediate complications.     CRITICAL CARE Performed by: Joya Gaskins   Total critical care time: 50  Critical care time was exclusive of separately billable procedures and treating other  patients.  Critical care was necessary to treat or prevent imminent or life-threatening deterioration.  Critical care was time spent personally by me on the following activities: development of treatment plan with patient and/or surrogate as well as nursing, discussions with consultants, evaluation of patient's response to treatment, examination of patient, obtaining history from patient or surrogate, ordering and performing treatments and interventions, ordering and review of laboratory studies, ordering and review of radiographic studies, pulse oximetry and re-evaluation of patient's condition.    Labs Reviewed  CBC  DIFFERENTIAL  BASIC METABOLIC PANEL  I-STAT TROPONIN I  PRO B NATRIURETIC PEPTIDE  LACTIC ACID, PLASMA  CULTURE, BLOOD (ROUTINE X 2)  CULTURE, BLOOD (ROUTINE X 2)  BLOOD GAS, ARTERIAL  PROTIME-INR   1:25 AM Pt seen on arrival in distress, but talking bipap placed and patient tolerating this well Recent admission, she has had significant cardiac history, recent MI and required stenting, h/o vtach, EF at 25%, required trach due to prolonged intubation Pneumonia noted, this is HCAP as lives in nursing facility with recent admission  1:51 AM D/w dr wert, pccm Aware of patient Awaiting lactate and monitor BP   2:31 AM Hypotension persistent, elevated lactate though suspect this is false since pH normal Hypoglycemia noted, D50 given D/w crit care dr wert, will admit to icu I will place central line  3:30 AM Central line placed in right femoral vein I attempted right IJ with ultrasound, but after three punctures no blood return Since INR> 7, I stopped at that location and held pressure for 5 minutes Pt stable and tolerated well  3:33 AM Critical care at bedside Levophed ordered as well as IV fluids abx ordered   MDM  Nursing notes reviewed and considered in documentation Previous records reviewed and considered xrays reviewed and considered All  labs/vitals reviewed and considered        Date: 02/19/2011  Rate: 99  Rhythm: normal sinus rhythm  QRS Axis: normal  Intervals: normal  ST/T Wave abnormalities: t wave inversion  Conduction Disutrbances:none  Narrative Interpretation:   Old EKG Reviewed: unchanged     Joya Gaskins, MD 02/13/2011 [phone]

## 2011-02-24 NOTE — ED Notes (Signed)
Vitamin K delivered from pharmacy; waiting for Dr. Bebe Shaggy to finish central line placement.

## 2011-02-24 NOTE — ED Notes (Signed)
Respiratory at bedside obtaining arterial blood gas.  

## 2011-02-24 NOTE — ED Notes (Signed)
Patient from [facility] assisted nursing facility; staff reports that patient has been short of breath for the past two hours; patient was found in the tripod position; O2 was in low 90's -- patient was placed on 3 liters O2 by [facility]; O2 increased to 92% -- fire department placed patient on non-rebreather mask.  According to staff, patient is normally alert and oriented x4.  Upon arrival to room, patient placed into gown and on BiPAP machine by respiratory.  Multiple RNs at bedside to obtain IV access.  EDP at bedside (Dr. Bebe Shaggy).  Patient responsive to verbal stimuli; alert to person, place, and time.  Will continue to monitor.

## 2011-02-24 NOTE — Progress Notes (Signed)
Called by bedside RN.  Patient is decompensating quite rapidly.  BP and sats are very poor.  Will need to repeat ABG.  Will start neosynephrine and place HD catheter for dialysis.  Additional CC time of 45 minutes.

## 2011-02-24 NOTE — H&P (Signed)
Name: [name] MRN: [id number] DOB: 04-Aug-1945    LOS: 0  PCCM ADMISSION NOTE  History of Present Illness:  This patient is 66 year old F with PMF of HTN, DVT, MI, aspiration pneumonia, respiratory failure and cardiogenic shock who presents to the ED with hyperthermia, hypotension and shortness of breath. Patient was noted to have shortness of breath, hypoxia with O2 Sat in low 90's on 3L [place], and hypotensive BP at 86/58  at [facility] facility at the night of 1/27. Patient was brought to [facility] ED for further evaluation. Patient was placed on BiPAP for respiratory support. She was noted to have Temp. 102.9, Hypotensive 80's/50's, CBG 15 and INR 7.4. PCCM consulted and will assume care.    Lines / Drains: 1/28  CVP>>>  Cultures: 1/28 blood culture>>> 1/28 urine culture>>> 1/28 lactic acid >>>16.9 1/28 PCT >>>  Antibiotics: 1/28 Van>>> 1/28 Zosyn>>>   Pressors Levophed  Tests / Events: 1/28 CXR: Developing density in the right mid lung suggesting focal consolidation  Past Medical History  Diagnosis Date  . Hypertension   . 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 Vitamin B12 deficiency (dietary) anemia 12/27/2010  . Arthritis   . MI (myocardial infarction) 01/09/2011  . Aspiration pneumonia 01/17/2011  . VT (ventricular tachycardia) 01/28/2011  . Femur fracture, right   . Osteoporosis   . Cardiogenic shock   . Respiratory failure   . Atrial fibrillation    Past Surgical History  Procedure Date  . Abdominal hysterectomy     06/2010  . Femur im nail 01/03/2011    Procedure: INTRAMEDULLARY (IM) RETROGRADE FEMORAL NAILING;  Surgeon: Budd Palmer;  Location: [facility] OR;  Service: Orthopedics;  Laterality: Right;  Right IM Retrograde Femoral Nailing   Prior to Admission medications   Medication Sig Start Date End Date Taking? Authorizing Provider  amiodarone (PACERONE) 200 MG tablet Take 1 tablet (200 mg total) by mouth daily. 02/07/11 02/07/12 Yes Canary Brim, NP  carvedilol (COREG) 6.25 MG tablet Take 6.25 mg by mouth 2 (two) times daily with a meal.   Yes Historical Provider, MD  cyanocobalamin (,VITAMIN B-12,) 1000 MCG/ML injection Inject 1,000 mcg into the muscle every 7 (seven) days.   Yes Historical Provider, MD  furosemide (LASIX) 40 MG tablet Take 1 tablet (40 mg total) by mouth daily. 02/07/11 02/07/12 Yes Canary Brim, NP  levalbuterol (XOPENEX) 0.63 MG/3ML nebulizer solution Take 3 mLs (0.63 mg total) by nebulization 3 (three) times daily. 02/07/11 02/07/12 Yes Canary Brim, NP  levalbuterol (XOPENEX) 0.63 MG/3ML nebulizer solution Take 3 mLs (0.63 mg total) by nebulization every 3 (three) hours as needed for wheezing or shortness of breath. 02/07/11 02/07/12 Yes Canary Brim, NP  rosuvastatin (CRESTOR) 10 MG tablet Take 10 mg by mouth daily. 02/07/11 02/07/12 Yes Canary Brim, NP  spironolactone (ALDACTONE) 25 MG tablet Take 1 tablet (25 mg total) by mouth 2 (two) times daily. 02/07/11 02/07/12 Yes Canary Brim, NP  warfarin (COUMADIN) 2.5 MG tablet Take 1 tablet (2.5 mg total) by mouth daily at 6 PM. 02/07/11 02/07/12 Yes Canary Brim, NP  feeding supplement (ENSURE) PUDG Take 1 Container by mouth 3 (three) times daily with meals. 02/07/11   Canary Brim, NP  feeding supplement (PRO-STAT SUGAR FREE 64) LIQD Take 30 mLs by mouth 2 (two) times daily at 10 AM and 5 PM. 02/07/11   Canary Brim, NP   Allergies No Known Allergies  Family History History reviewed. No pertinent family history.  Social History  reports that she has never smoked. She has never used smokeless tobacco. She reports that she does not drink alcohol or use illicit drugs.  Review Of Systems  11 points review of systems is negative with an exception of listed in HPI.  Vital Signs: Temp:  [101.5 F (38.6 C)-102.9 F (39.4 C)] 102.9 F (39.4 C) (01/28 0205) Pulse Rate:  [97-103] 98  (01/28 0205) Resp:  [22-38] 30  (01/28 0205) BP: (80-93)/(52-76) 93/67 mmHg (01/28  0223) SpO2:  [98 %-100 %] 98 % (01/28 0205) FiO2 (%):  [40 %-60 %] 40 % (01/28 0205)    Physical Examination: General:  NAD on BiPAP Neuro:  Sedative and easily arousal HEENT:  PERRL Neck:  supple Cardiovascular:  Irregularly irregular, no M/G/R Lungs:  B/L lung sounds diminished . Right side rales. Occasional wheezing. Abdomen:  Soft, BS x4. Musculoskeletal:  No edma Skin: No rash  Ventilator settings: Vent Mode:  [-]  FiO2 (%):  [40 %-60 %] 40 %  Labs and Imaging:   Lab March 08, 2011 0114 02/17/11 1456  PHART 7.472* --  PCO2ART 25.5* --  PO2ART 162.0* --  HCO3 18.7* --  TCO2 19 27  O2SAT 100.0 --   Basic Metabolic Panel:  Basename 03/08/2011 0121  NA 129*  K 5.4*  CL 82*  CO2 15*  GLUCOSE <20*  BUN 23  CREATININE 2.61*  CALCIUM 11.0*  MG --  PHOS --   CBC:  Basename 03/08/11 0121  WBC 31.7*  NEUTROABS 23.1*  HGB 10.6*  HCT 32.2*  MCV 79.1  PLT 416*   Basename 2011/03/08 0121  LABPROT 64.1*  INR 7.41*   Results for PYPER, OLEXA (MRN [id number]) as of 03-08-11 03:19  Ref. Range 03-08-11 01:52  Color, Urine Latest Range: YELLOW  AMBER (A)  APPearance Latest Range: CLEAR  TURBID (A)  Specific Gravity, Urine Latest Range: 1.005-1.030  1.020  pH Latest Range: 5.0-8.0  5.0  Glucose, UA Latest Range: NEGATIVE mg/dL NEGATIVE  Bilirubin Urine Latest Range: NEGATIVE  NEGATIVE  Ketones, ur Latest Range: NEGATIVE mg/dL 15 (A)  Protein Latest Range: NEGATIVE mg/dL 629 (A)  Urobilinogen, UA Latest Range: 0.0-1.0 mg/dL 1.0  Nitrite Latest Range: NEGATIVE  NEGATIVE  Leukocytes, UA Latest Range: NEGATIVE  TRACE (A)  Urine-Other No range found AMORPHOUS URATES/PHOSPHATES  WBC, UA Latest Range: <3 WBC/hpf 0-2  RBC / HPF Latest Range: <3 RBC/hpf 0-2  Squamous Epithelial / LPF Latest Range: RARE  RARE  Bacteria, UA Latest Range: RARE  FEW (A)   *RADIOLOGY REPORT*  Clinical Data: Shortness of breath. Low blood pressure.  PORTABLE CHEST - 1 VIEW  Comparison:  02/05/2011  Findings: Interval development of rounded focus of increased  density in the right mid lung probably representing focal  consolidation. Normal heart size and pulmonary vascularity. No  blunting of costophrenic angles. No pneumothorax.  IMPRESSION:  Developing density in the right mid lung suggesting focal  consolidation.    Assessment and Plan: 1. Sepsis and septic shock   The clinical manifestation is consistent with the DX.  Plan    - sepsis protocol.    - presor--Levophed    - fluid resucictation for a goal of CVP 8-12 and MAP>65    - broad spectrum ABX treatment    - consider stress steroids    - monitor lactic acid level.  2. Acute respiratory failure    Patient presents with SOB and low O2 Sat. BiPAP required for respiratory support. Plan - continue NIMV - low  threshold for intubation  3. Right mid lobe pneumonia, likely aspiration PNA. Hx Aspiration pneumonia    Plan    - broaden spectrum ABX Vanc and Zosyn    - NPO    - will need ST eval if resume diet.    - follow up CBC and PCXR  4. Acute renal failure and high gap metabolic acidosis,  Baseline Cr 0.8.      patient presents with Cr 2.61 and gap 32 metabolic acidosis. Likely due to pre-renal vs ATN    Plan:    - fluid resucitation    - avoid all Nephrogenic medication. Pharmacy to monitor    - follow up BMP and electrolytes  5 A fib    Patient has been on amiodarone and coumadin at home. She is rate controlled at 80's now. Plan:  - will hold po amiodarone and coumadin (INR 7.4)  - may consider amiodarone gtt if RVR noted.  6. Hypoglycemia    Patient is noted to have CBG of 15. D50 IV given to increase her CBG to 91 Plan   - CBG Q 2 hours  - D 50 as needed.   7 . High INR, likely due to coumadin tx. Plan -FFP 2 units - Vitamin K - follow up INR. - hold coumadin  8 Hx. Of CHF with EF 30-35% in Dec, 12.    All diuretics are on hold. Plan - monitor patient closely in the stting of fluid  resuscitation. - follow up CXR  Best practices / Disposition: -->ICU status under PCCM -->full code -->NO VTE prohylaxis now with INR 7.4. -->protonix for GI Px -->NIMV -->diet: NPO -->family updated at bedside  The patient is critically ill with multiple organ systems failure and requires high complexity decision making for assessment and support, frequent evaluation and titration of therapies, application of advanced monitoring technologies and extensive interpretation of multiple databases. Critical Care Time devoted to patient care services described in this note is 45 minutes.  LI, NA 02/05/2011, 3:06 AM  Pt seen and examined and database reviewed. I agree with above findings, assessment and plan  Billy Fischer, MD;  PCCM service; Mobile [phone]

## 2011-02-24 NOTE — ED Notes (Addendum)
Due to "wet voice" at this time, Vitamin K not to be administered through oral route.  Failed patient with stroke swallow screen.  Admitting MD made aware.

## 2011-02-24 NOTE — ED Notes (Signed)
IV team paged to access second line; multiple attempts by RN in ED -- unsuccessful.

## 2011-02-24 NOTE — ED Notes (Signed)
Patient has had a total of three liters normal saline in; critical care MD at bedside stating that he wants a total of three more liters to go in with pressure bags.  Inetta Fermo, RN at bedside connecting normal saline attached to pressure bags to femoral central line.  Levophed has been increased to 15 mcg/min, per admitting MD.  Pressure is currently 90's systolic.  Will continue to monitor.

## 2011-02-25 ENCOUNTER — Inpatient Hospital Stay (HOSPITAL_COMMUNITY): Payer: Medicare Other

## 2011-02-25 LAB — PREPARE FRESH FROZEN PLASMA
Unit division: 0
Unit division: 0
Unit division: 0

## 2011-02-25 LAB — TYPE AND SCREEN
ABO/RH(D): O NEG
Unit division: 0
Unit division: 0

## 2011-02-25 LAB — POCT I-STAT 3, ART BLOOD GAS (G3+)
Acid-base deficit: 17 mmol/L — ABNORMAL HIGH (ref 0.0–2.0)
Acid-base deficit: 20 mmol/L — ABNORMAL HIGH (ref 0.0–2.0)
Acid-base deficit: 10 mmol/L — ABNORMAL HIGH (ref 0.0–2.0)
Bicarbonate: 9 mEq/L — ABNORMAL LOW (ref 20.0–24.0)
Bicarbonate: 16.5 mEq/L — ABNORMAL LOW (ref 20.0–24.0)
O2 Saturation: 92 %
O2 Saturation: 79 %
O2 Saturation: 97 %
TCO2: 11 mmol/L (ref 0–100)
TCO2: 10 mmol/L (ref 0–100)
TCO2: 18 mmol/L (ref 0–100)
pCO2 arterial: 28.7 mmHg — ABNORMAL LOW (ref 35.0–45.0)
pCO2 arterial: 31.8 mmHg — ABNORMAL LOW (ref 35.0–45.0)
pO2, Arterial: 80 mmHg (ref 80.0–100.0)
pO2, Arterial: 60 mmHg — ABNORMAL LOW (ref 80.0–100.0)
pO2, Arterial: 78 mmHg — ABNORMAL LOW (ref 80.0–100.0)

## 2011-02-25 LAB — CBC
HCT: 36.6 % (ref 36.0–46.0)
Hemoglobin: 12 g/dL (ref 12.0–15.0)
MCHC: 32.8 g/dL (ref 30.0–36.0)
MCV: 83.4 fL (ref 78.0–100.0)
WBC: 16.6 10*3/uL — ABNORMAL HIGH (ref 4.0–10.5)

## 2011-02-25 LAB — HEPATIC FUNCTION PANEL
Albumin: 1.9 g/dL — ABNORMAL LOW (ref 3.5–5.2)
Total Bilirubin: 3.4 mg/dL — ABNORMAL HIGH (ref 0.3–1.2)
Total Protein: 5.3 g/dL — ABNORMAL LOW (ref 6.0–8.3)

## 2011-02-25 LAB — RENAL FUNCTION PANEL
CO2: 15 mEq/L — ABNORMAL LOW (ref 19–32)
CO2: 9 mEq/L — CL (ref 19–32)
Calcium: 7 mg/dL — ABNORMAL LOW (ref 8.4–10.5)
Chloride: 83 mEq/L — ABNORMAL LOW (ref 96–112)
Creatinine, Ser: 1.06 mg/dL (ref 0.50–1.10)
Creatinine, Ser: 0.4 mg/dL — ABNORMAL LOW (ref 0.50–1.10)
GFR calc Af Amer: 62 mL/min — ABNORMAL LOW (ref >90)
GFR calc non Af Amer: >90 mL/min (ref >90)
Glucose, Bld: 28 mg/dL — CL (ref 70–99)
Sodium: 135 mEq/L (ref 135–145)

## 2011-02-25 LAB — PROTIME-INR: INR: 3.16 — ABNORMAL HIGH (ref 0.00–1.49)

## 2011-02-25 LAB — URINE CULTURE
Colony Count: NO GROWTH
Colony Count: NO GROWTH
Culture  Setup Time: 201301281905

## 2011-02-25 LAB — POCT I-STAT, CHEM 8
Chloride: 91 mEq/L — ABNORMAL LOW (ref 96–112)
Creatinine, Ser: 0.9 mg/dL (ref 0.50–1.10)
Hemoglobin: 10.9 g/dL — ABNORMAL LOW (ref 12.0–15.0)
Potassium: 4 mEq/L (ref 3.5–5.1)
Sodium: 131 mEq/L — ABNORMAL LOW (ref 135–145)

## 2011-02-25 LAB — GLUCOSE, CAPILLARY
Glucose-Capillary: 23 mg/dL — CL (ref 70–99)
Glucose-Capillary: 79 mg/dL (ref 70–99)
Glucose-Capillary: 88 mg/dL (ref 70–99)
Glucose-Capillary: 101 mg/dL — ABNORMAL HIGH (ref 70–99)
Glucose-Capillary: 112 mg/dL — ABNORMAL HIGH (ref 70–99)
Glucose-Capillary: 122 mg/dL — ABNORMAL HIGH (ref 70–99)

## 2011-02-25 MED ORDER — VITAMIN K1 10 MG/ML IJ SOLN
10.0000 mg | Freq: Once | INTRAVENOUS | Status: AC
  Administered 2011-02-25: 10 mg via INTRAVENOUS
  Filled 2011-02-25: qty 1

## 2011-02-25 MED ORDER — DEXTROSE 10 % IV SOLN
INTRAVENOUS | Status: DC
  Administered 2011-02-25: 05:00:00 via INTRAVENOUS

## 2011-02-25 MED ORDER — DEXTROSE 50 % IV SOLN
INTRAVENOUS | Status: AC
  Administered 2011-02-25: 50 mL
  Filled 2011-02-25: qty 50

## 2011-02-25 MED ORDER — PRISMASOL BGK 4/2.5 32-4-2.5 MEQ/L IV SOLN: INTRAVENOUS | Status: DC

## 2011-02-25 MED ORDER — SODIUM CHLORIDE 0.9 % IJ SOLN
INTRAMUSCULAR | Status: AC
  Administered 2011-02-25: 10 mL
  Filled 2011-02-25: qty 20

## 2011-02-25 MED ORDER — PRISMASOL BGK 0/2.5 32-2.5 MEQ/L IV SOLN
INTRAVENOUS | Status: DC
  Administered 2011-02-25 (×3): via INTRAVENOUS_CENTRAL
  Filled 2011-02-25 (×9): qty 5000

## 2011-02-25 NOTE — Progress Notes (Signed)
CRITICAL VALUE ALERT  Critical value received:  CBG 28  Date of notification:  02/25/2011  Time of notification:  0430 Critical value read back: Yes  Nurse who received alert: Lavonna Monarch   MD notified (1st page):  Per Protocol  Time of first page:  Per Protocol   MD notified (2nd page):  Time of second page:  Responding MD:  Per Protocol  Time MD responded:  Per Protocol

## 2011-02-25 NOTE — Progress Notes (Addendum)
Name: Jerzy Crotteau MRN: [id number] DOB: 05/16/1945  ELECTRONIC ICU PHYSICIAN NOTE  Problem:  Hypoglycemia, not on insulin  Intervention:  D10W to start at 50 cc per hour   Chart reviewed and Dr Westley Hummer note re: NCB appropriate but not ordered > changed status to NCB but continue all reasonable medical interventions, no further escalation as discussed by Dr Sung Amabile with the fm.  Sandrea Hughs 02/25/2011, 4:33 AM

## 2011-02-25 NOTE — Progress Notes (Signed)
Clinical Social Worker met with family at bedside (sister, sister in law, and cousin),  CSW provided emotional support.  CSW to continue to follow and assist as needed.  Family stated they have had "highs and lows" with regards to pt's health.  CSW validated difficult feelings associated with hospitalizations of a loved one.  CSW completed psychosocial assessment, Please see shadow chart for details.   Angelia Mould, MSW, [place] [phone]

## 2011-02-25 NOTE — Clinical Documentation Improvement (Signed)
Anemia Documentation Clarification Query  THIS DOCUMENT IS NOT A PERMANENT PART OF THE MEDICAL RECORD  RESPOND TO THE THIS QUERY, FOLLOW THE INSTRUCTIONS BELOW:  1. If needed, update documentation for the patient's encounter via the notes activity.  2. Access this query again and click edit on the In [employer].  3. After updating, or not, click F2 to complete all highlighted (required) fields concerning your review. Select "additional documentation in the medical record" OR "no additional documentation provided".  4. Click Sign note button.  5. The deficiency will fall out of your In Basket *Please let us know if you are not able to complete this workflow by phone or e-mail (listed below). Please update your documentation within the medical record to reflect your response to this query          02/25/11  Dear Dr. Sung Amabile [facility]  In an effort to better capture your patient's severity of illness, reflect appropriate length of stay and utilization of resources, a review of the patient medical record has revealed the following indicators. Based on your clinical judgment, please clarify and document in a progress note and/or discharge summary the clinical condition associated with the following supporting information:In responding to this query please exercise your independent judgment.  The fact that a query is asked, does not imply that any particular answer is desired or expected.  Possible Clinical Conditions?   " Precipitous drop in Hematocrit  " Anemia due to chronic disease  " Acute Blood Loss Anemia   " Other Condition____________  " Cannot Clinically Determine   Supporting Information:  Diagnostics: Admission H&H:10.6/32.2 1/28 0121 H&H: 1/28 0440: 6.4/20.9 Current H&H: 1/29 10.9/32.0 Treatments: Transfusion: PRBCs IV fluids / plasma expanders: NSS, FFP Medications: Vitamin K Serial H&H monitoring  You may use possible, probable, or suspect with inpatient  documentation. Possible, probable, suspected diagnoses MUST be documented at the time of discharge.  Reviewed: additional documentation in the medical record  Thank You,  Amada Kingfisher RN, [name], CCM  Clinical Documentation Specialist: [phone] [email or web address]  Health Information Management [place]

## 2011-02-25 NOTE — Clinical Documentation Improvement (Signed)
Abnormal Labs Clarification  THIS DOCUMENT IS NOT A PERMANENT PART OF THE MEDICAL RECORD  TO RESPOND TO THE THIS QUERY, FOLLOW THE INSTRUCTIONS BELOW:  1. If needed, update documentation for the patient's encounter via the notes activity.  2. Access this query again and click edit on the [employer].  3. After updating, or not, click F2 to complete all highlighted (required) fields concerning your review. Select "additional documentation in the medical record" OR "no additional documentation provided".  4. Click Sign note button.  5. The deficiency will fall out of your InBasket *Please let us know if you are not able to complete this workflow by phone or e-mail (listed below).  Please update your documentation within the medical record to reflect your response to this query.                                                                                   02/25/11  Dear Dr. Sung Amabile [facility]  In a better effort to capture your patient's severity of illness, reflect appropriate length of stay and utilization of resources, a review of the medical record has revealed the following indicators. Based on your clinical judgment, please clarify and document in a progress note and/or discharge summary the clinical condition associated with the following supporting information:In responding to this query please exercise your independent judgment.  The fact that a query is asked, does not imply that any particular answer is desired or expected.  Abnormal findings (laboratory, x-ray, pathologic, and other diagnostic results) are not coded and reported unless the physician indicates their clinical significance.   The medical record reflects the following clinical findings, please clarify the diagnostic and/or clinical significance:      Possible Clinical Conditions?                                 Supporting Information:  Hyponatremia                                       Lab Test:   Sodium  Hypernatremia                                       Risk Factors: Sepsis                                           Patient Values: 129, 132, 131                 Normal Values :135-145  Other Condition__________                   Cannot Clinically Determine_________   Reviewed: additional documentation in the medical record   Thank You,  Amada Kingfisher RN, [name], CCM  Clinical Documentation Specialist: [phone] [email or web address]  Health Information Management [place]

## 2011-02-25 NOTE — Clinical Documentation Improvement (Signed)
CHF DOCUMENTATION CLARIFICATION QUERY  THIS DOCUMENT IS NOT A PERMANENT PART OF THE MEDICAL RECORD  TO RESPOND TO THE THIS QUERY, FOLLOW THE INSTRUCTIONS BELOW:  1. If needed, update documentation for the patient's encounter via the notes activity.  2. Access this query again and click edit on the In [employer].  3. After updating, or not, click F2 to complete all highlighted (required) fields concerning your review. Select "additional documentation in the medical record" OR "no additional documentation provided".  4. Click Sign note button.  5. The deficiency will fall out of your In Basket *Please let us know if you are not able to complete this workflow by phone or e-mail (listed below).  Please update your documentation within the medical record to reflect your response to this query.                                                                                    02/25/11  Dear Dr. [facility],  In a better effort to capture your patient's severity of illness, reflect appropriate length of stay and utilization of resources, a review of the patient medical record has revealed the following indicators the diagnosis of Heart Failure.   Based on your clinical judgment, please clarify and document in a progress note and/or discharge summary the clinical condition associated with the following supporting information:In responding to this query please exercise your independent judgment.  The fact that a query is asked, does not imply that any particular answer is desired or expected.  Possible Clinical Conditions?   Chronic Systolic Congestive Heart Failure Chronic Diastolic Congestive Heart Failure Chronic Systolic & Diastolic Congestive Heart Failure  Acute on Chronic Systolic Congestive Heart Failure Acute on Chronic Diastolic Congestive Heart Failure Acute on Chronic Systolic & Diastolic  Congestive Heart Failure Other  Condition________________________________________ Cannot Clinically Determine  Supporting Information:  Risk Factors:PMH: chf Signs & Symptoms: Lungs: B/L lung sounds diminished . Right side rales. Occasional wheezin Diagnostics: ECHO: Lt ventricle: Doppler parameters are consistent with abnormal left ventricular relaxation (grade 1 diastolic dysfunction).  XRAY: 1/28 CXR: perihilar infiltration suggesting fluid overload or edema, right lower rib fracture, right sided focal consolidation  Treatment:Dialysis  Reviewed:  no additional documentation provided  Thank You,  Amada Kingfisher  RN, [name], CCM  Clinical Documentation Specialist: [phone] [email or web address]  Health Information Management [place]

## 2011-02-25 NOTE — Progress Notes (Signed)
Name: [name] MRN: [id number] DOB: 1945-04-18    LOS: 1  PCCM ADMISSION NOTE  History of Present Illness:  This patient is 66 year old F with PMF of HTN, DVT, MI, aspiration pneumonia, respiratory failure and cardiogenic shock who presents to the ED with hyperthermia, hypotension and shortness of breath. Patient was noted to have shortness of breath, hypoxia with O2 Sat in low 90's on 3L [place], and hypotensive BP at 86/58  at [facility] facility at the night of 1/27. Patient was brought to [facility] ED for further evaluation. Patient was placed on BiPAP for respiratory support. She was noted to have Temp. 102.9, Hypotensive 80's/50's, CBG 15 and INR 7.4. PCCM consulted and will assume care. Patient was emergently intubated overnight.  Subjective/Overnight events: -Started on neo, but turned off as it did not offer much for BP support -Levo increased to 50 mcg/min -Skin mottling now present  Lines / Drains: 1/28 L IJ HD cath>>> 1/28  Right femoral vein central line>>> 1/28 ETT >> 1/28 Foley>>  Cultures: 1/28 resp culture>>>gm + cocci in pairs 1/28 blood culture>>> 1/28 urine culture>>> 1/28 MRSA PCR >>neg  Antibiotics: 1/28 Vanc>>> 1/28 Zosyn>>>   Pressors/Drips 1/28 Levophed>>> 1/28 Vasopressin>>> 1/28 Neo >>> 1/28 1/28 Bicarb in D5 100 cc/hr >>> 1/28 Amio gtt>>>1/28  Tests / Events: 11/28 LE venous dopplers - Occlusive right femoropopliteal DVT  12/07 ORIF R femur fx  12/07 Code blue - torsades, CPR 12/08 Cath - Severe 95% thrombotic lesion mid RCA s/p bare metal stent. Severe LV dysfunction w/ inferior apical and distal anterior hypokinesis.  12/08 CT head: No acute abnormalities  12/08 Echo: Systolic function was moderately to severely reduced. EF  30% to 35%.  12/11 Reintubated for sudden onset resp distress  12/11 CTA chest: No evidence of pulmonary embolus. Moderate bilateral effusions with significant consolidation and atelectasis in both lungs. Perihilar  infiltration suggesting edema. Indeterminate mass or fluid collection in the right supraclavicular region measuring about 3 x 2.2 cm.  12/14 Extubated  12/20 Acute resp distress, transfer to 3300  12/21 Intubated for aspiration pneumonia  12/21 Percutaneous tracheostomy 12/28 Transferred from SDU to ICU for trach site bleeding  12/31 Transferred to SDU. Failed FEES  01/28/11 Warfarin resumed  1/7 Downsize to #4, pending swallow eval  1/8 Swallow eval, passed 1/10 Discharged to [facility] ------------------------------------------------------------------------------------------------------------------------------------------------------- 1/28 Transferred from nursing home for shortness of breath>>intubated, on pressors 1/28 CXR: perihilar infiltration suggesting fluid overload or edema, right lower rib fracture, right sided focal consolidation 1/28 Acidosis not responsive to bicarb gtt>>HD cath placed>>>CVVHD  Vital Signs: Temp:  [91.6 F (33.1 C)-100.6 F (38.1 C)] 91.6 F (33.1 C) (01/29 0700) Pulse Rate:  [30-92] 30  (01/29 0200) Resp:  [18-39] 29  (01/29 0700) BP: (73-175)/(50-140) 93/58 mmHg (01/28 2330) SpO2:  [5 %-100 %] 100 % (01/29 0200) Arterial Line BP: (65-126)/(43-90) 100/66 mmHg (01/29 0700) FiO2 (%):  [79.4 %-100 %] 80.1 % (01/29 0700) Weight:  [124 lb 9 oz (56.5 kg)] 124 lb 9 oz (56.5 kg) (01/29 0500) I/O last 3 completed shifts: In: 12129.7 [I.V.:9702; Blood:1767.8; IV Piggyback:660] Out: 2381 [Urine:21; Other:2360]  Physical Examination: General:  Intubated, eyes are open Neuro:  Eyes are open, but she does not track, does not respond to voice, does not follow commands, does not move extremities HEENT: Pupils are not reactive Cardiovascular:  Irregularly irregular, no M/G/R Lungs:  B/L lung sounds diminished . Right side rales. Occasional wheezing. Abdomen:  Soft, BS x4. Musculoskeletal:  No edema Skin: skin mottling  Ventilator settings: Vent  Mode:  [-] PRVC FiO2 (%):  [79.4 %-100 %] 80.1 % Set Rate:  [30 bmp] 30 bmp Vt Set:  [500 mL] 500 mL PEEP:  [4.9 cmH20-5.5 cmH20] 5.1 cmH20 Plateau Pressure:  [23 cmH20-29 cmH20] 27 cmH20  Labs and Imaging:   Lab 02/25/11 0430 02/12/2011 1334 02/23/2011 1112 01/31/2011 0905 01/29/2011 0459  PHART 7.321* 7.061* 7.133* 7.167* 6.995*  PCO2ART 30.6* 31.8* 28.5* 28.7* 32.9*  PO2ART 78.0* 60.0* 73.0* 80.0 151.0*  HCO3 16.5* 9.0* 9.5* 10.4* 7.9*  TCO2 18 10 10 11 9   O2SAT 97.0 79.0 89.0 92.0 98.0   Basic Metabolic Panel:  Basename 02/25/11 0400 02/09/2011 0440  NA 135 138  K 4.2 4.0  CL 87* 101  CO2 15* 12*  GLUCOSE 28* 59*  BUN 11 20  CREATININE 1.06 2.15*  CALCIUM 7.0* 9.3  MG 2.0 1.9  PHOS 2.8 5.2*   CBC:  Basename 02/25/11 0439 02/04/2011 1100 02/23/2011 0440 02/18/2011 0121  WBC 16.6* 26.2* -- --  NEUTROABS -- -- 14.4* 23.1*  HGB 12.0 10.7* -- --  HCT 36.6 33.3* -- --  MCV 83.4 86.0 -- --  PLT 129* 204 -- --    Basename 02/25/11 0439 02/09/2011 1100  LABPROT 32.9* 29.0*  INR 3.16* 2.69*   CXR 1/29: Unchanged, with bilateral airspace disease    Assessment and Plan:  1. Sepsis and septic shock - likely 2/2 aspiration PNA. Previously intubated, and recent complicated hospitalization for similar situation. Culture data pending, Vanc/Zosyn started.    Plan    - Sepsis protocol. Continue fluids, Levophed and Vasopressin. Patient was on Neo but no       significant improvement, thus turned off. Do not restart.    - Continue broad spectrum ABX treatment, stress dose steroids.     - Hypothermia with CVVHD, cannot tolerate bear hugger, continue blood re-warmer  2. Acute respiratory failure - 2/2 shock and suspected aspiration PNA. Intubated.  Plan - continue full vent support - sedation protocol  - follow CXR and ABG as mentioned above   3. Renal Acute renal failure and high gap metabolic acidosis,  Baseline Cr 0.8. Patient was started on bicarb gtt but remains acidotic. Renal  failure in setting of shock. No recent ACE-I use. CVVHD started in hopes to clear acidosis. ABG improved with near normal pH, and improvement in bicarb levels. However clinical presentation remains the same, still pressor dependent, with persistent elevated lactic acid levels.       Plan:    - continue bicarb gtt. Continue current management with CVVHD     - repeat abg and lactic acid   4. CVS  A fib -Patient has been on amiodarone and coumadin at home. Amio drip turned off due to IV access issues. Currently rate controlled and does not need to be on continuous drip. Will hold amio for now. Supratherapeutic INR, s/p FFP and PRBCs transfusion.  Hx. of CHF with EF 30-35% in Dec. 12.   Plan: -Hold home meds (Lasix, Coreg, Spironolactone) in setting of hypotension  CAD s/p RCA BMS  Plan: -Hold antiplatelet/anticoagulation, anti-hypertensives in setting of shock, anemia, hypotension, and supratherapeutic INR  5. Endocrine Hypoglycemia - Patient is noted to have CBG of 15. D50 IV given to increase her CBG to 91  Plan  - CBG Q 2 hours - Continue hydration with D 10 at 50 cc/hr   6. Heme   Supratherapeutic INR, on coumadin tx. S/P  2 units FFP, Vitamin K. Will give another dose of Vit K 1/29.  Lab Results  Component Value Date   INR 3.16* 02/25/2011   INR 2.69* 02/11/2011   INR >10.00* 02/01/2011   Anemia - S/P 2 units PRBCs. Hgb improved after transfusion.   Lab 02/25/11 0439 01/31/2011 1100 02/21/2011 0440 02/25/2011 0121  HGB 12.0 10.7* 6.4* 10.6*  HCT 36.6 33.3* 20.9* 32.2*   Plan: -Transfuse to hgb >9 per EGDT protocol -Follow coags and CBC   7. Goals of care: Very poor prognosis. This is patient's 4th intubation in <2 months, complicated by severe septic shock, severe acidosis, 2/2 recurrent aspiration pneumonia, supratherapeutic INR and anemia. Family updated at bedside. Will continue full aggressive medical management, with continuing dialysis to help clear acidosis. Will  continue pressor and full vent support. In the setting of cardiac arrest, will not initiate chest compressions and will only consider cardioversion under certain circumstances, as discussed with patient's daughter.    Best practices / Disposition: -->ICU status under PCCM -->limited code - full aggressive management, no compressions -->NO VTE prohylaxis now with elevated INR -->protonix for GI Px -->Ventilator bundle -->diet: NPO   The patient is critically ill with multiple organ systems failure and requires high complexity decision making for assessment and support, frequent evaluation and titration of therapies, application of advanced monitoring technologies and extensive interpretation of multiple databases. Critical Care Time devoted to patient care services described in this note is 45 minutes.  SIDHU,AMANJOT 02/25/2011, 7:27 AM  Pt seen and examined and database reviewed. I agree with above findings, assessment and plan.  The note above accurately reflects the care plan as discussed on AM rounds. Family updated @ bedside. I re-iterated that Ms Mcglothlin is very critically ill and that we have "maxed out" the therapies that we have to offer. We will continue to provide full aggressive support. She remains NCB if arrests. Will reassess later in week. If no evidence of improvement, will discuss possible transition to full comfort care  Billy Fischer, MD;  PCCM service; Mobile [phone]

## 2011-02-25 NOTE — Clinical Documentation Improvement (Signed)
Abnormal Labs Clarification  THIS DOCUMENT IS NOT A PERMANENT PART OF THE MEDICAL RECORD  TO RESPOND TO THE THIS QUERY, FOLLOW THE INSTRUCTIONS BELOW:  1. If needed, update documentation for the patient's encounter via the notes activity.  2. Access this query again and click edit on the [employer].  3. After updating, or not, click F2 to complete all highlighted (required) fields concerning your review. Select "additional documentation in the medical record" OR "no additional documentation provided".  4. Click Sign note button.  5. The deficiency will fall out of your InBasket *Please let us know if you are not able to complete this workflow by phone or e-mail (listed below).  Please update your documentation within the medical record to reflect your response to this query.                                                                                   02/25/11  Dear Dr. Sung Amabile Marton Red[place]  In a better effort to capture your patient's severity of illness, reflect appropriate length of stay and utilization of resources, a review of the medical record has revealed the following indicators. Based on your clinical judgment, please clarify and document in a progress note and/or discharge summary the clinical condition associated with the following supporting information:In responding to this query please exercise your independent judgment.  The fact that a query is asked, does not imply that any particular answer is desired or expected.  Abnormal findings (laboratory, x-ray, pathologic, and other diagnostic results) are not coded and reported unless the physician indicates their clinical significance.   The medical record reflects the following clinical findings, please clarify the diagnostic and/or clinical significance:      Possible Clinical Conditions?                                 Supporting Information:  Shock liver                                    Lab Test: AST,  ALT  Ischemic Hepatitis             Risk Factors: Hypotension/shock; sepsis                                       Patient Values: 1/28: 1070 (ast); 190 (alt)                       Normal Values : 0-37; 0-35  Other Condition___________________                Treatment: underlying cause  Cannot Clinically Determine_________   Reviewed: additional documentation in the medical record   Thank You,  Amada Kingfisher RN, [name], CCM  Clinical Documentation Specialist: [phone] [email or web address] Information Management [place]

## 2011-02-25 NOTE — Progress Notes (Signed)
Subjective: Interval History: none. Patient unresponsive   Objective: Vital signs in last 24 hours:  Temp:  [89.6 F (32 C)-100.6 F (38.1 C)] 89.6 F (32 C) (01/29 1100) Pulse Rate:  [30-92] 58  (01/29 0825) Resp:  [18-39] 30  (01/29 1100) BP: (73-175)/(53-140) 104/66 mmHg (01/29 0825) SpO2:  [5 %-100 %] 99 % (01/29 1100) Arterial Line BP: (65-126)/(43-90) 95/76 mmHg (01/29 1100) FiO2 (%):  [79.4 %-100 %] 80.2 % (01/29 1100) Weight:  [56.5 kg (124 lb 9 oz)] 56.5 kg (124 lb 9 oz) (01/29 0500)  Weight change: 7.7 kg (16 lb 15.6 oz)  Intake/Output: I/O last 3 completed shifts: In: 12129.7 [I.V.:9702; Blood:1767.8; IV Piggyback:660] Out: 2595 [Urine:21; Other:2574]   Intake/Output this shift:  Total I/O In: 885.6 [I.V.:775.6; IV Piggyback:110] Out: 1017 [Other:1017]  General: Intubated,  Neuro: Sedated,  Cardiovascular: Irregularly irregular, no M/G/R  Lungs: B/L lung sounds diminished . Abdomen: Soft, BS x4. diminished bowel sounds Musculoskeletal: No edema  Skin: No rash   Lab Results:  Basename 02/25/11 0826 02/25/11 0439 01/29/2011 1100 02/13/2011 0440  WBC -- 16.6* 26.2* 29.4*  HGB 10.9* 12.0 10.7* --  HCT 32.0* 36.6 33.3* --  PLT -- 129* 204 231   BMET  Basename 02/25/11 0826 02/25/11 0400 02/10/2011 0440 01/30/2011 0121  NA 131* 135 138 --  K 4.0 4.2 4.0 --  CL 91* 87* 101 --  CO2 -- 15* 12* 15*  GLUCOSE 284* 28* 59* --  BUN 7 11 20  --  CREATININE 0.90 1.06 2.15* --  CALCIUM -- 7.0* 9.3 11.0*  PHOS -- 2.8 5.2* --   LFT  Basename 02/25/11 0400 01/31/2011 0440  PROT -- 3.0*  ALBUMIN 1.9* --  AST -- 215*  ALT -- 56*  ALKPHOS -- 258*  BILITOT -- 0.5  BILIDIR -- --  IBILI -- --   PT/INR  Basename 02/25/11 0439 02/08/2011 1100  LABPROT 32.9* 29.0*  INR 3.16* 2.69*   Hepatitis Panel No results found for this basename: HEPBSAG,HCVAB,HEPAIGM,HEPBIGM in the last 72 hours  Studies/Results: Portable Chest Xray In Am  02/25/2011  *RADIOLOGY REPORT*   Clinical Data: Pneumonia, shortness of breath, sepsis  PORTABLE CHEST - 1 VIEW  Comparison: February 24, 2011.  Findings: Endotracheal tube and left internal jugular catheter are unchanged  in position.  There is no significant change involving bilateral perihilar alveolar opacities most consistent with pneumonia.  IMPRESSION: No change in bilateral perihilar opacities compared to prior exam.  Original Report Authenticated By: Venita Sheffield., M.D.   Dg Chest Port 1 View  02/19/2011  *RADIOLOGY REPORT*  Clinical Data: Post line placement.  PORTABLE CHEST - 1 VIEW  Comparison: 02/04/2011  Findings: Left vas cath has been placed.  The tip is at the cavoatrial junction.  No pneumothorax.  Endotracheal tube is stable.  Patchy bilateral airspace disease is unchanged.  Mild cardiomegaly.  IMPRESSION: Left vas cath tip at the cavoatrial junction.  No pneumothorax. Otherwise no change.  Original Report Authenticated By: Cyndie Chime, M.D.   Dg Chest Port 1 View  02/21/2011  *RADIOLOGY REPORT*  Clinical Data: Post CPR  PORTABLE CHEST - 1 VIEW  Comparison: 02/25/2011 at 0058 hours  Findings: Shallow inspiration.  Heart size and pulmonary vascularity are normal.  There are bilateral perihilar infiltrates which have developed since the previous study, suggesting fluid overload.  Perihilar infiltrates on the right mass can be previously demonstrated focal area of consolidation.  No blunting of costophrenic angles.  No pneumothorax.  Fracture of the right anterolateral ninth rib.  Endotracheal tube was placed with tip about 4 cm above the carina.  IMPRESSION: Endotracheal tube placed with tip about 4 cm above the carina. Development of perihilar infiltration suggesting fluid overload or edema.  Right lower rib fracture.  Original Report Authenticated By: Marlon Pel, M.D.   Dg Chest Port 1 View  02/23/2011  *RADIOLOGY REPORT*  Clinical Data: Shortness of breath.  Low blood pressure.  PORTABLE CHEST - 1 VIEW   Comparison: 02/05/2011  Findings: Interval development of rounded focus of increased density in the right mid lung probably representing focal consolidation.  Normal heart size and pulmonary vascularity.  No blunting of costophrenic angles.  No pneumothorax.  IMPRESSION: Developing density in the right mid lung suggesting focal consolidation.  Original Report Authenticated By: Marlon Pel, M.D.    I have reviewed the patient's current medications.  Assessment/Plan: 1.Acute oliguric renal failure in the setting of shock and multiorgan failure. Pressors and ventilator dependent.  I suspect sever ATN. Will need dialysis support with CVVHD  2.Shock pressors  3.Metabolic acidosis correct with IV bicarbonate  4.Antibiotics Vancomycin and Zosyn  5. Coagulopathic Shock and coumadin  6. Electrolytes stable  No changes this morning acidosis has improved will continue to monitor.   LOS: 1 [name] @TODAY @11 :23 AM

## 2011-02-25 NOTE — Progress Notes (Signed)
Potassium 5.9 called to Dr. Hyman Hopes.  BGK 4/2.5 to be replaced by fluid not containing potassium.  Will pause BGK 4/2.5 for now.  Will continue to monitor.

## 2011-02-26 LAB — GLUCOSE, CAPILLARY
Glucose-Capillary: 148 mg/dL — ABNORMAL HIGH (ref 70–99)
Glucose-Capillary: 342 mg/dL — ABNORMAL HIGH (ref 70–99)

## 2011-02-26 MED ORDER — HEPARIN SODIUM (PORCINE) 1000 UNIT/ML IJ SOLN
INTRAMUSCULAR | Status: AC
  Filled 2011-02-26: qty 3

## 2011-02-27 LAB — POCT I-STAT 3, ART BLOOD GAS (G3+)
Acid-base deficit: 17 mmol/L — ABNORMAL HIGH (ref 0.0–2.0)
Bicarbonate: 10.9 mEq/L — ABNORMAL LOW (ref 20.0–24.0)
Patient temperature: 90.1
TCO2: 12 mmol/L (ref 0–100)
pH, Arterial: 7.221 — ABNORMAL LOW (ref 7.350–7.400)

## 2011-02-27 LAB — CULTURE, RESPIRATORY W GRAM STAIN

## 2011-02-27 NOTE — Discharge Summary (Signed)
PCCM Death Summary  Name: [name] MRN: [id number] DOB: August 17, 1945 66 y.o.  Date of Admission: 02/16/2011 12:49 AM Date of Discharge: 02/08/2011 Attending Physician: Dr. Sung Amabile  Cause of death:  1.) Acute renal failure and high gap metabolic acidosis on continuous dialysis  Secondary Causes of Death  2.) Septic shock secondary to aspiration pneumonia 3.) Ventilator dependent respiratory failure secondary to aspiration pneumonia, shock and acidosis 4.) Atrial fibrillation 5.) Femoral DVT 6.) Recent right femur fracture status post ORIF December 2012 7.) CAD with recent cardiac arrest and placement of bare metal stent in RCA December 2012 8.) Supratherapeutic INR >10 - likely in setting of coumadin therapy  9.) Hypoglycemia 10.) Anemia with precipitous drop in Hct requiring RBC transfusion 11) Mild hyponatremia 12) Mild abnormalities in LFTs related to sepsis    Hospital Course: This patient is 66 year old woman with past medical history of HTN, DVT, MI, aspiration pneumonia, respiratory failure and cardiogenic shock who presented to the ED with hyperthermia, hypotension and shortness of breath. Patient was noted to have shortness of breath, hypoxia with O2 Sat in low 90's on 3L [place], and hypotensive BP at 86/58 at [facility] facility at the night of 02/23/2011. The patient was placed on BiPAP for respiratory support. She was noted to have Temp. 102.9, Hypotensive 80's/50's, CBG 15 and INR 7.4. The patient continued to decompensate and was emergently intubated 02/14/2011. The patient was profoundly acidotic with pH of 6.9 and lactic acid >19 and was started on sodium bicarbonate drip. Along with sodium bicarbonate the patient was on Levophed and Vasopressin. Despite these efforts the patient continued to decline. The patient required emergent dialysis and thus a hemodialysis catheter was placed. The patient was given 4 units of FFP along with two doses of IV Vit K 10 mg  prior to insertion to prevent bleeding complications due to supratherapeutic INR. The patient was maintained on continuous dialysis therapy, however her HD catheter clotted off and dialysis was terminated. The patient's family had decided not to pursue any further interventions, and thus a new HD catheter was not placed. The patient was maintained on pressor support, however became bradycardic and asystolic. No resuscitative attempts were made since the patient was DNR.   SignedMelida Quitter 02/03/2011, 5:21 PM  I have reviewed and amended the above documentation  Billy Fischer, MD;  PCCM service; Mobile [phone]

## 2011-02-28 NOTE — Progress Notes (Signed)
Death Note:   Pt. Code status DNR. Pupils fixed and dilated  No audible breath sounds, No palpable pulse, asystole on  Monitor. Verified with Juanda Bond RN. Family contacted and made aware, autopsy was declined.  Provided with telephone number to contact when arrangements have been made. Elink MD notified.

## 2011-02-28 DEATH — deceased

## 2011-03-02 LAB — CULTURE, BLOOD (ROUTINE X 2)
Culture  Setup Time: 201301281044
Culture  Setup Time: 201301282145
Culture: NO GROWTH
Culture: NO GROWTH

## 2011-03-06 ENCOUNTER — Telehealth: Payer: Self-pay | Admitting: Pulmonary Disease

## 2011-03-10 ENCOUNTER — Inpatient Hospital Stay: Payer: Medicare Other | Admitting: Emergency Medicine

## 2013-05-15 IMAGING — CR DG CHEST 2V
2 series · 2 of 2 positions shown · non-contrast
Comparison: None

CLINICAL DATA: Fevers, rule out pneumonia.

CHEST - 2 VIEW

[view not recorded (1 of 2)]
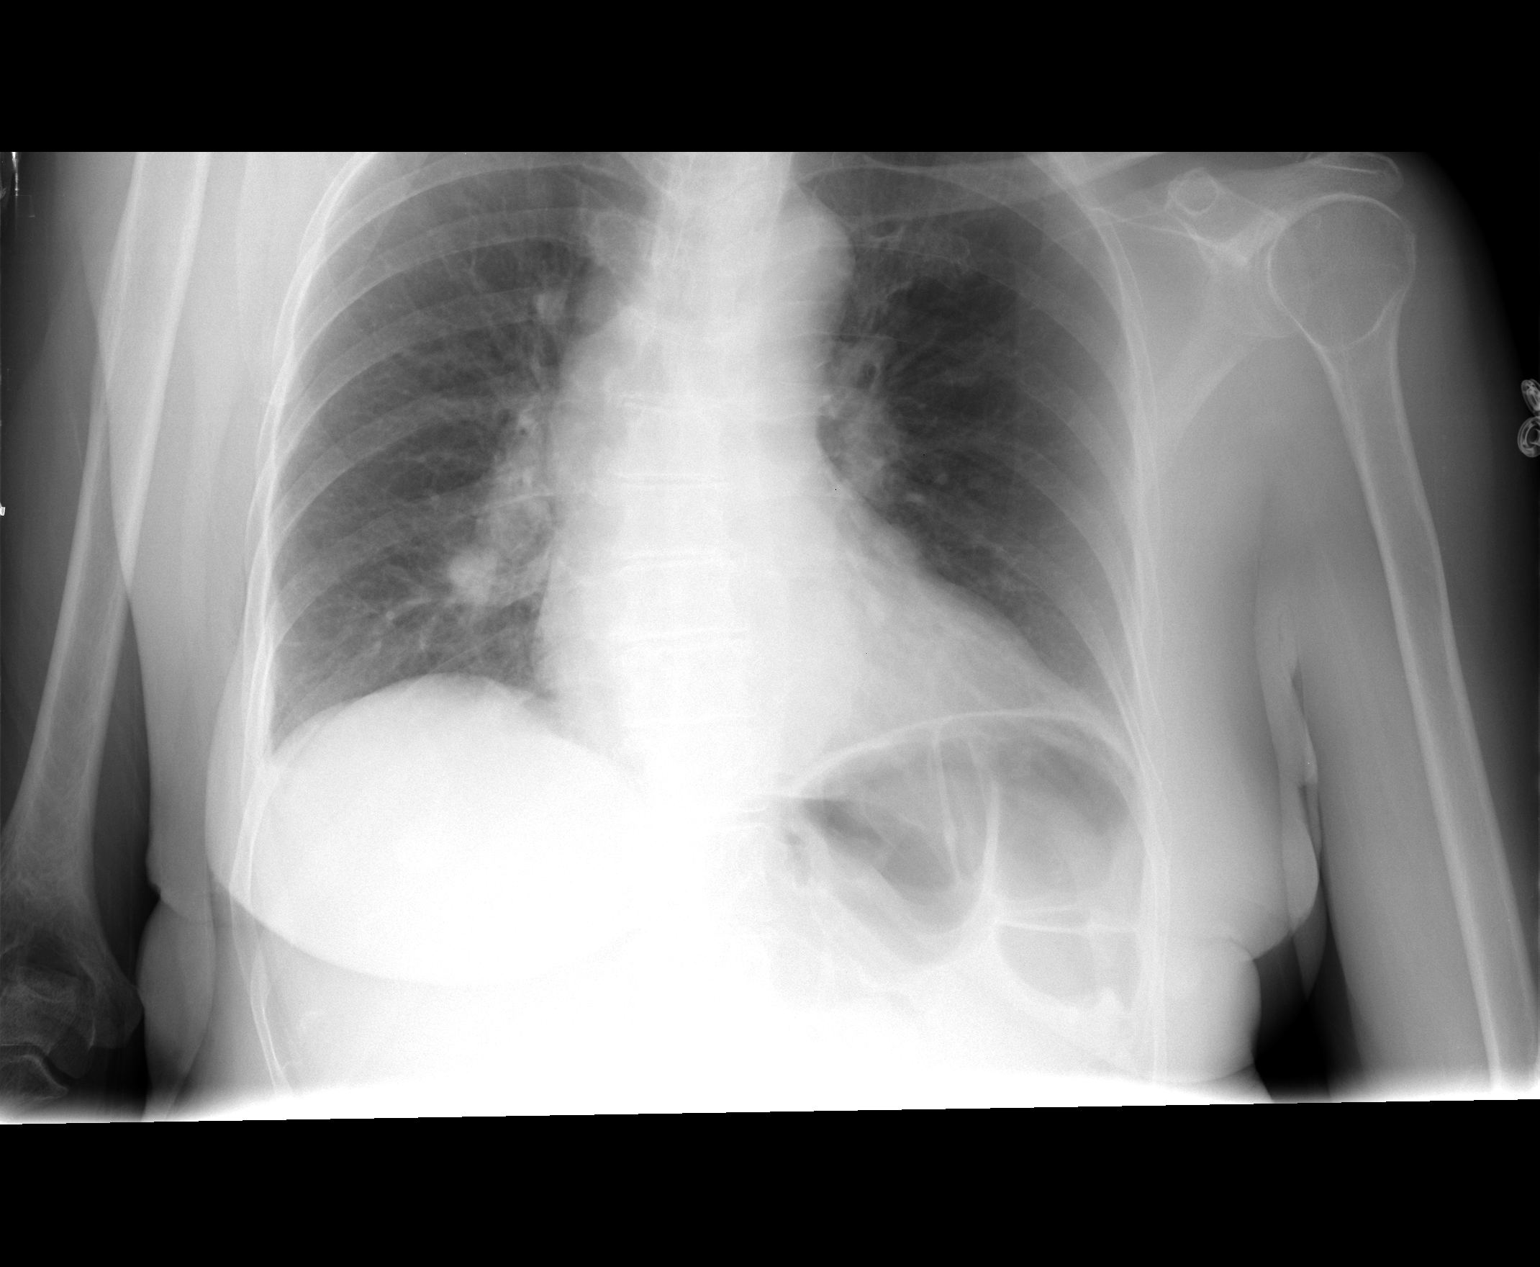

[view not recorded (2 of 2)]
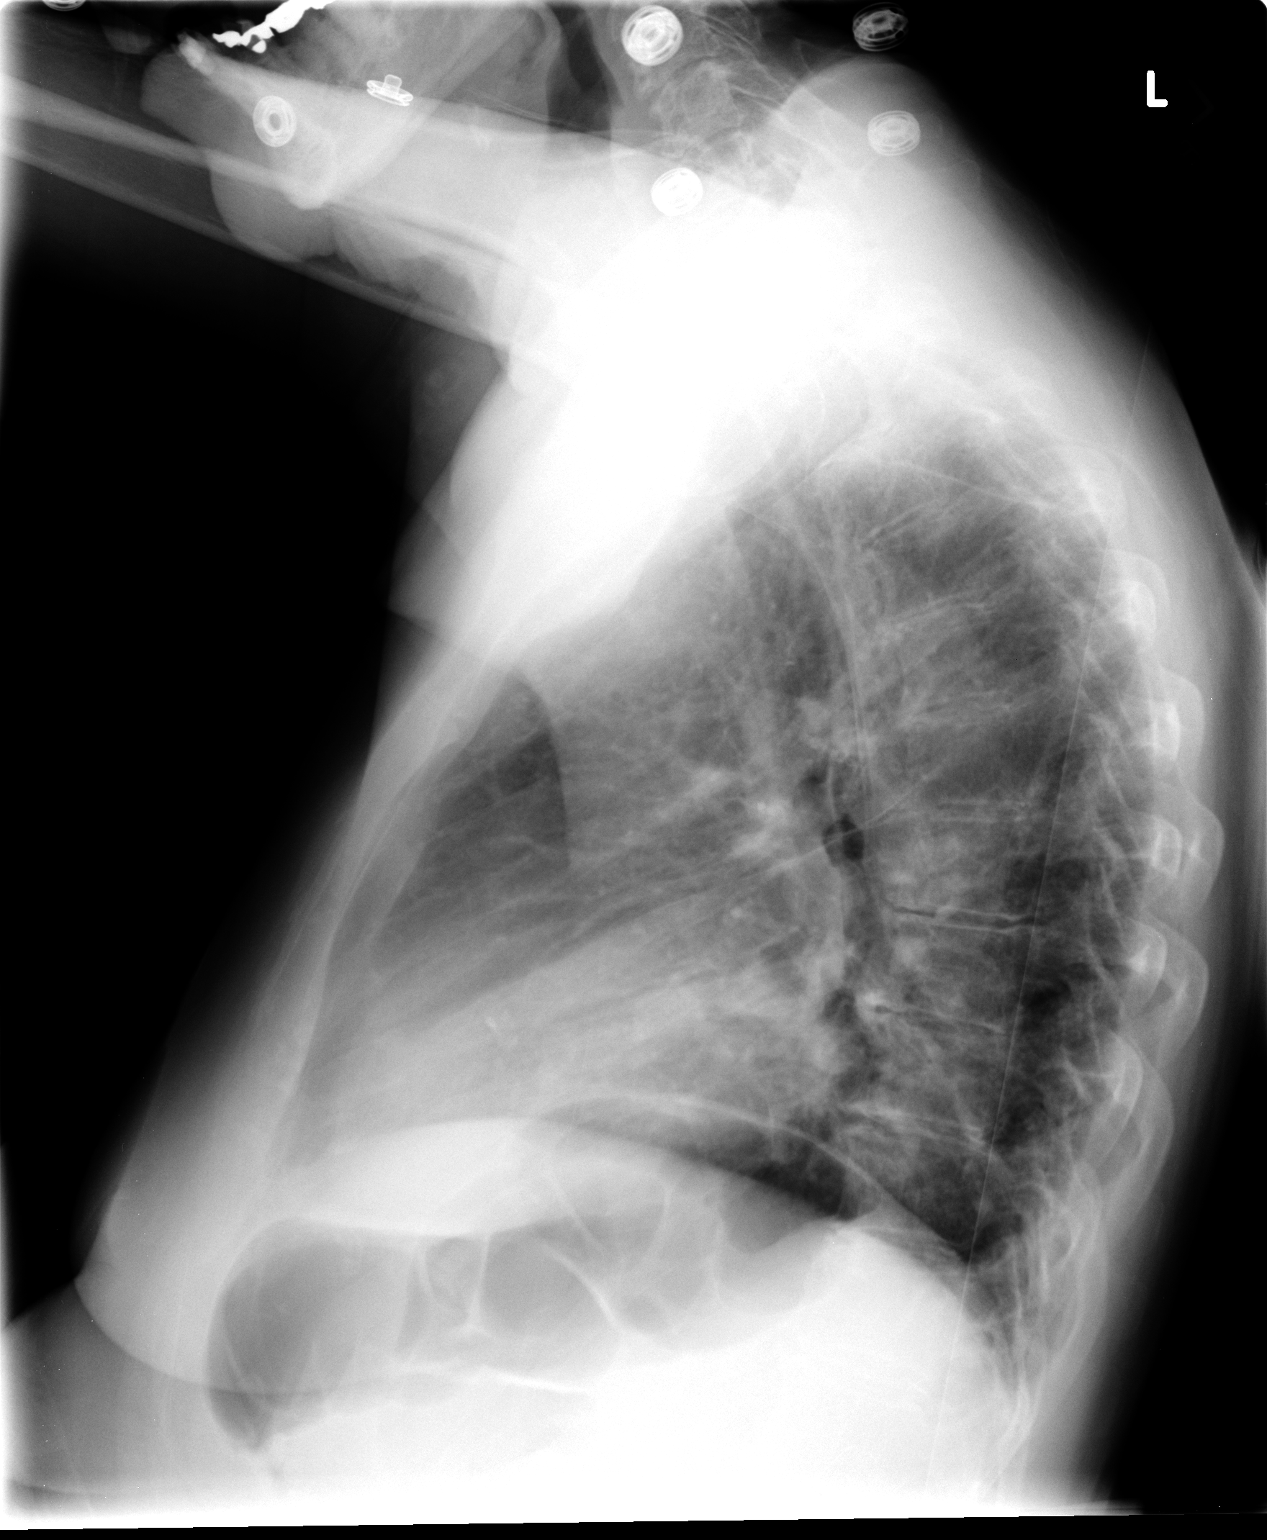

[2 of 2 positions shown; findings below may reference images not displayed]

FINDINGS: Two views of the chest were obtained.  The lungs are
clear without airspace disease.  Prominent right hilar structures
could be related to vessels but difficult to exclude
lymphadenopathy, particularly on the lateral view. There is
nodularity in the right hilar region on the lateral view.  Bony
structures are intact.
IMPRESSION: Prominent right hilar structures as described.  An old comparison
exam would be useful if available.  Otherwise, this area could be
evaluated with a chest CT with IV contrast.

No focal airspace disease.

## 2013-05-19 IMAGING — US US EXTREM LOW VENOUS*R*
1 series · 14 of 24 positions shown · non-contrast
Comparison: 12/25/2010.

CLINICAL DATA: Right leg pain and swelling.  History of femur
fracture.

RIGHT LOWER EXTREMITY VENOUS DUPLEX ULTRASOUND
TECHNIQUE: Gray-scale sonography with graded compression, as well
as color Doppler and duplex ultrasound, were performed to evaluate
the deep venous system of the lower extremity from the level of the
common femoral vein through the popliteal and proximal calf veins.
Spectral Doppler was utilized to evaluate flow at rest and with
distal augmentation maneuvers.

[Series 1: us extrem low venous*right* · 14 of 29 slices shown]
[im 1/29]
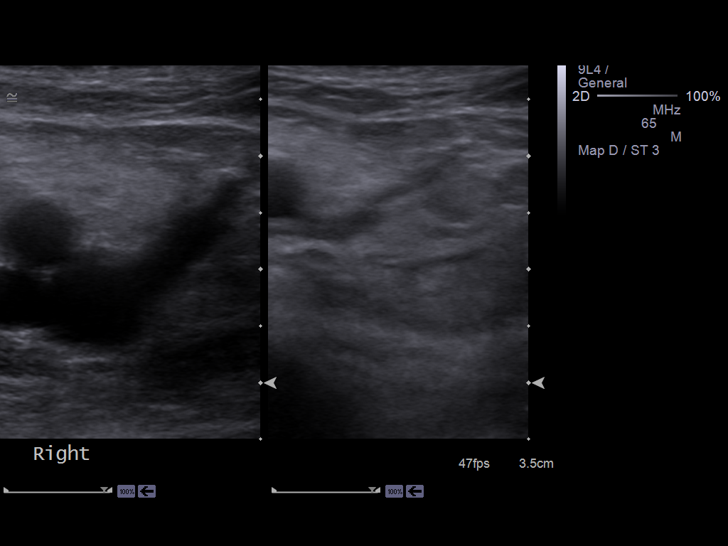
[im 3/29]
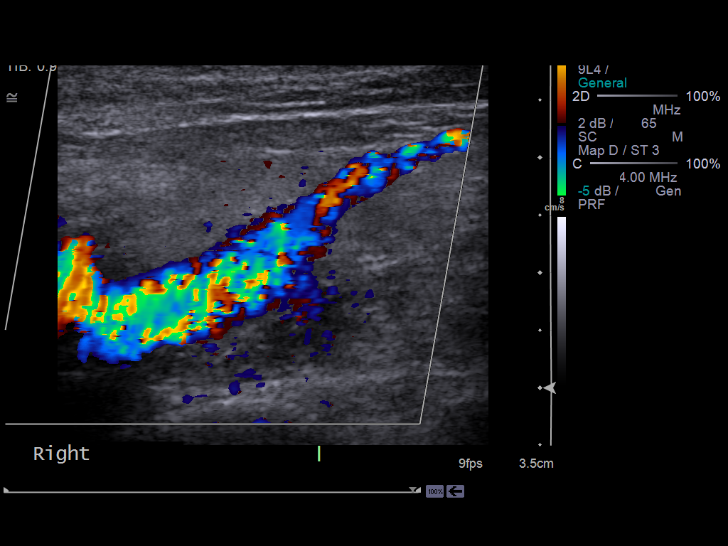
[im 5/29]
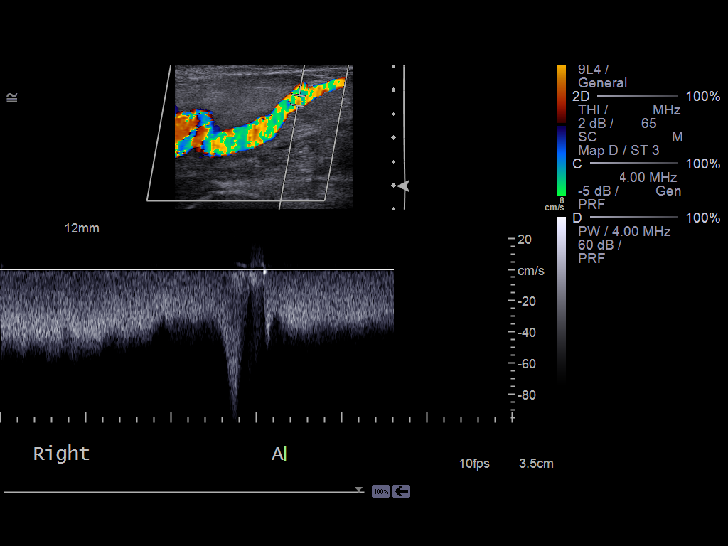
[im 8/29]
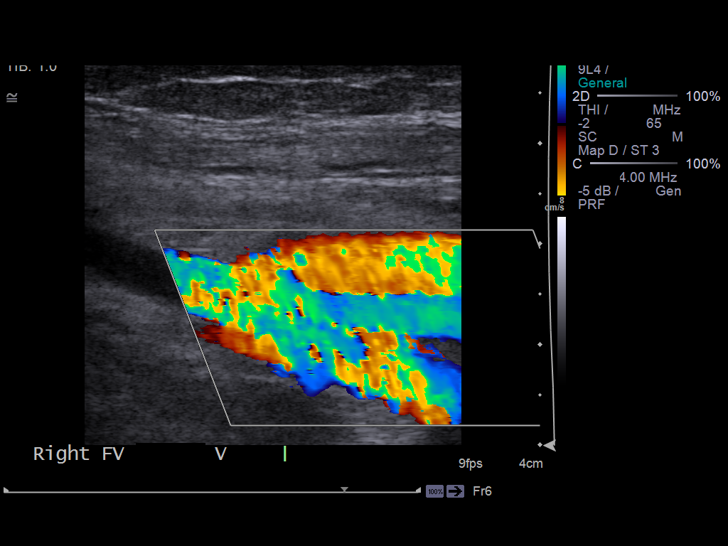
[im 9/29]
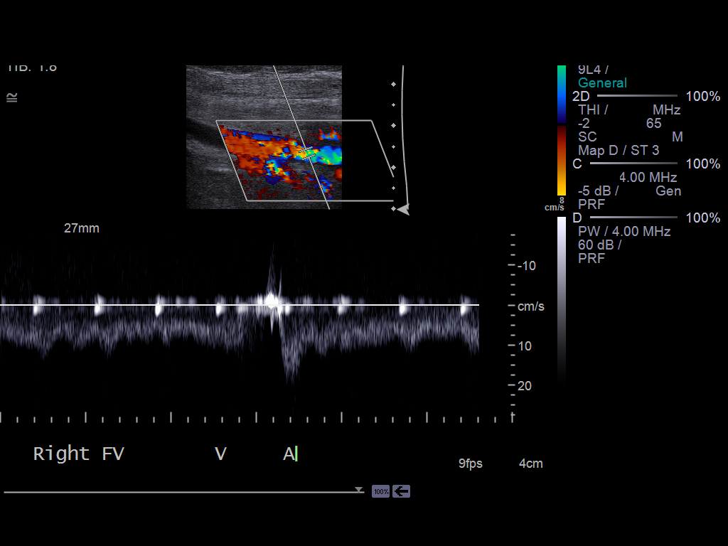
[im 11/29]
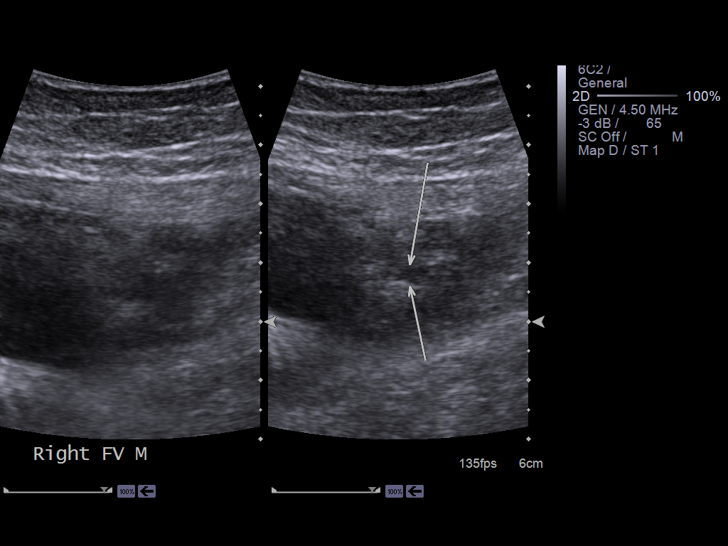
[im 14/29]
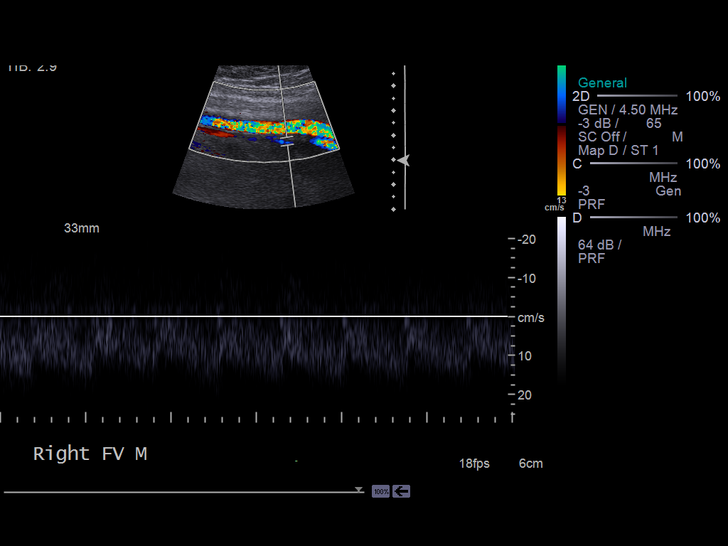
[im 15/29]
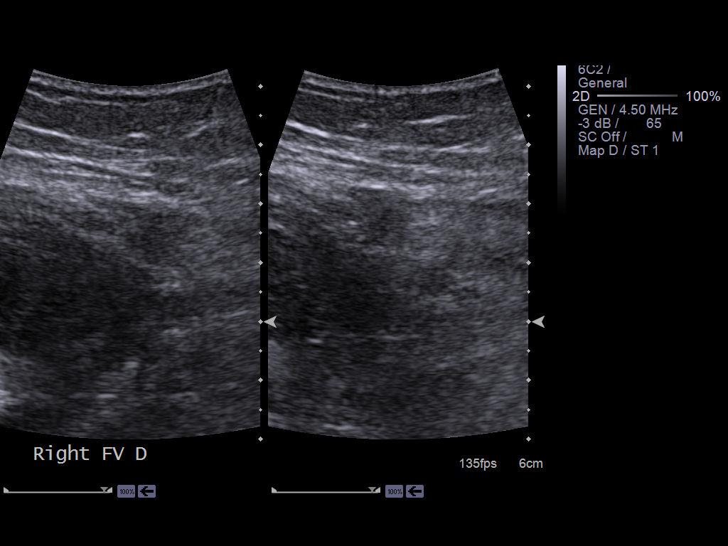
[im 18/29]
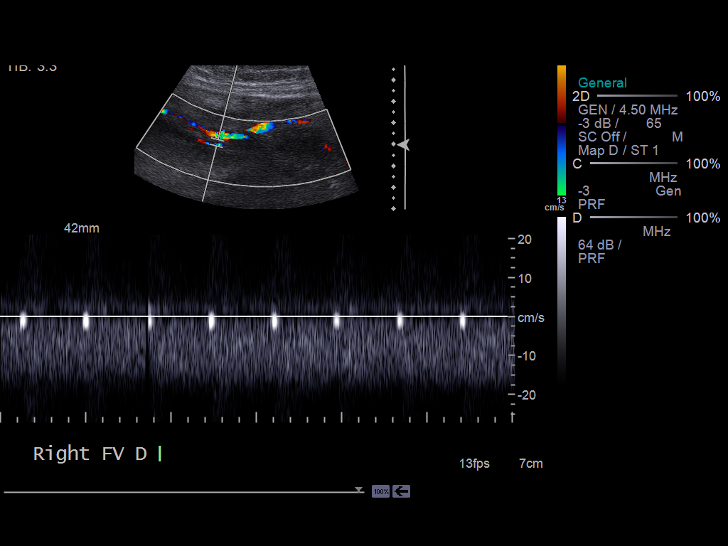
[im 20/29]
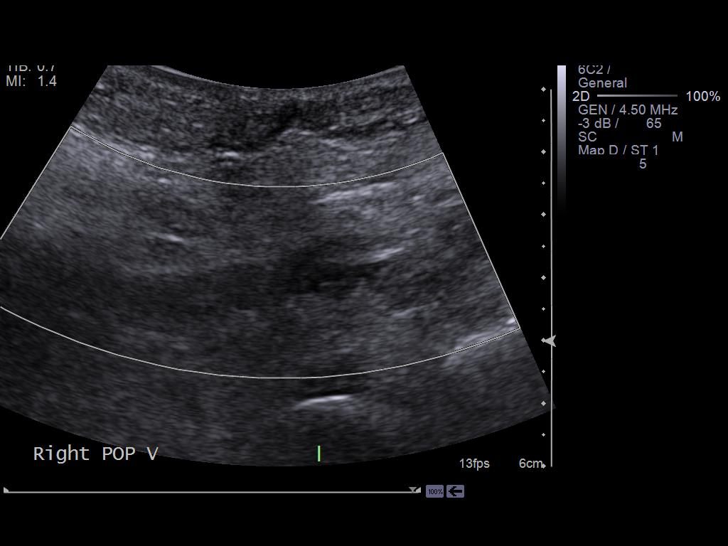
[im 22/29]
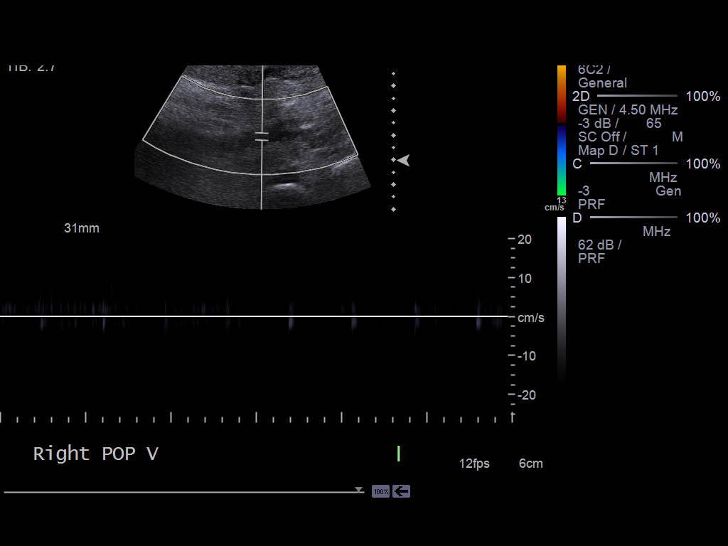
[im 24/29]
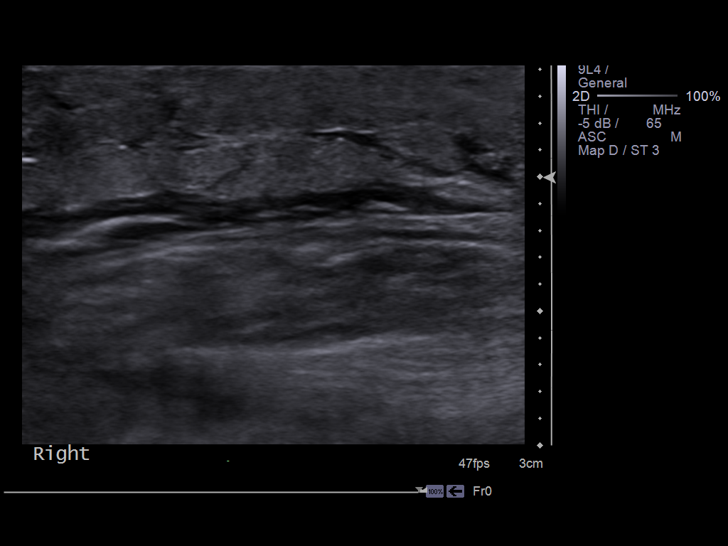
[im 26/29]
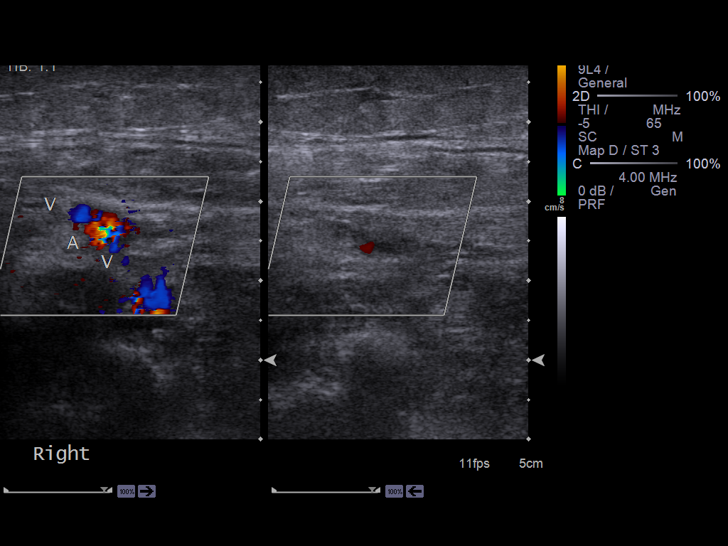
[im 29/29]
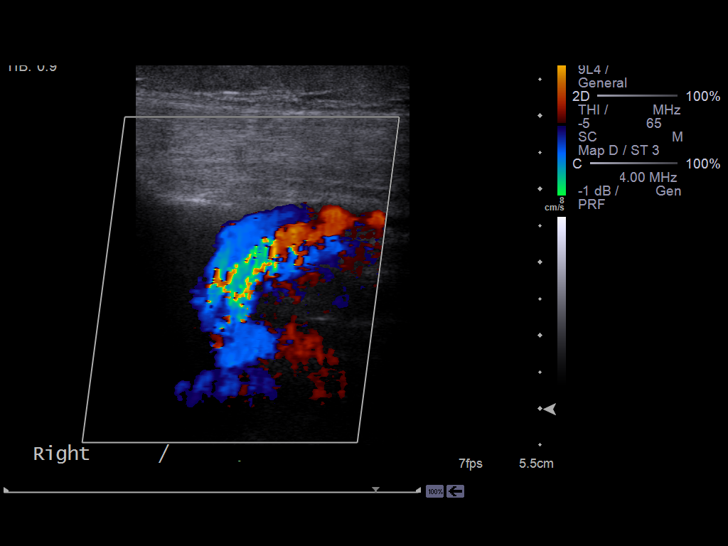

[14 of 24 positions shown; findings below may reference images not displayed]

FINDINGS: The common femoral, profunda femoral and upper
superficial femoral veins are patent and compressible.  There is
clot in the mid superficial femoral vein with partial occlusion and
complete occlusion in the distal SFA and popliteal veins. This
appears stable.
IMPRESSION: Deep venous thrombosis in the superficial femoral and popliteal
veins, unchanged since prior examination.

## 2013-05-19 IMAGING — CR DG FEMUR 2+V*R*
4 series · 4 of 4 positions shown · non-contrast
Comparison: None

CLINICAL DATA: Pain and swelling right thigh, deformity, patient
states no known injury

RIGHT FEMUR - 2 VIEW

[view not recorded (1 of 4)]
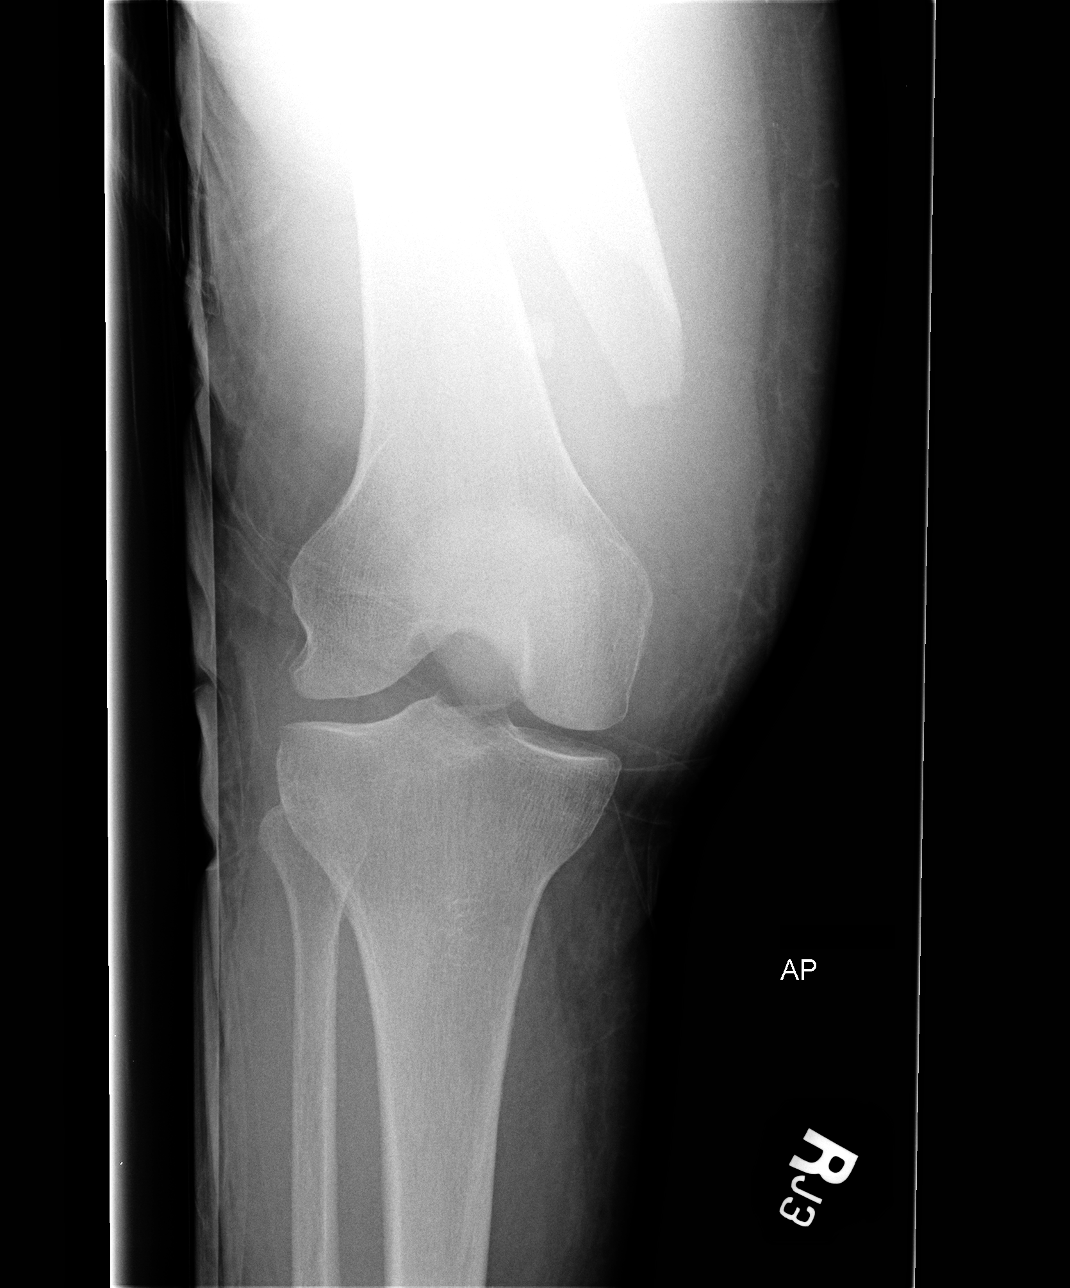

[view not recorded (2 of 4)]
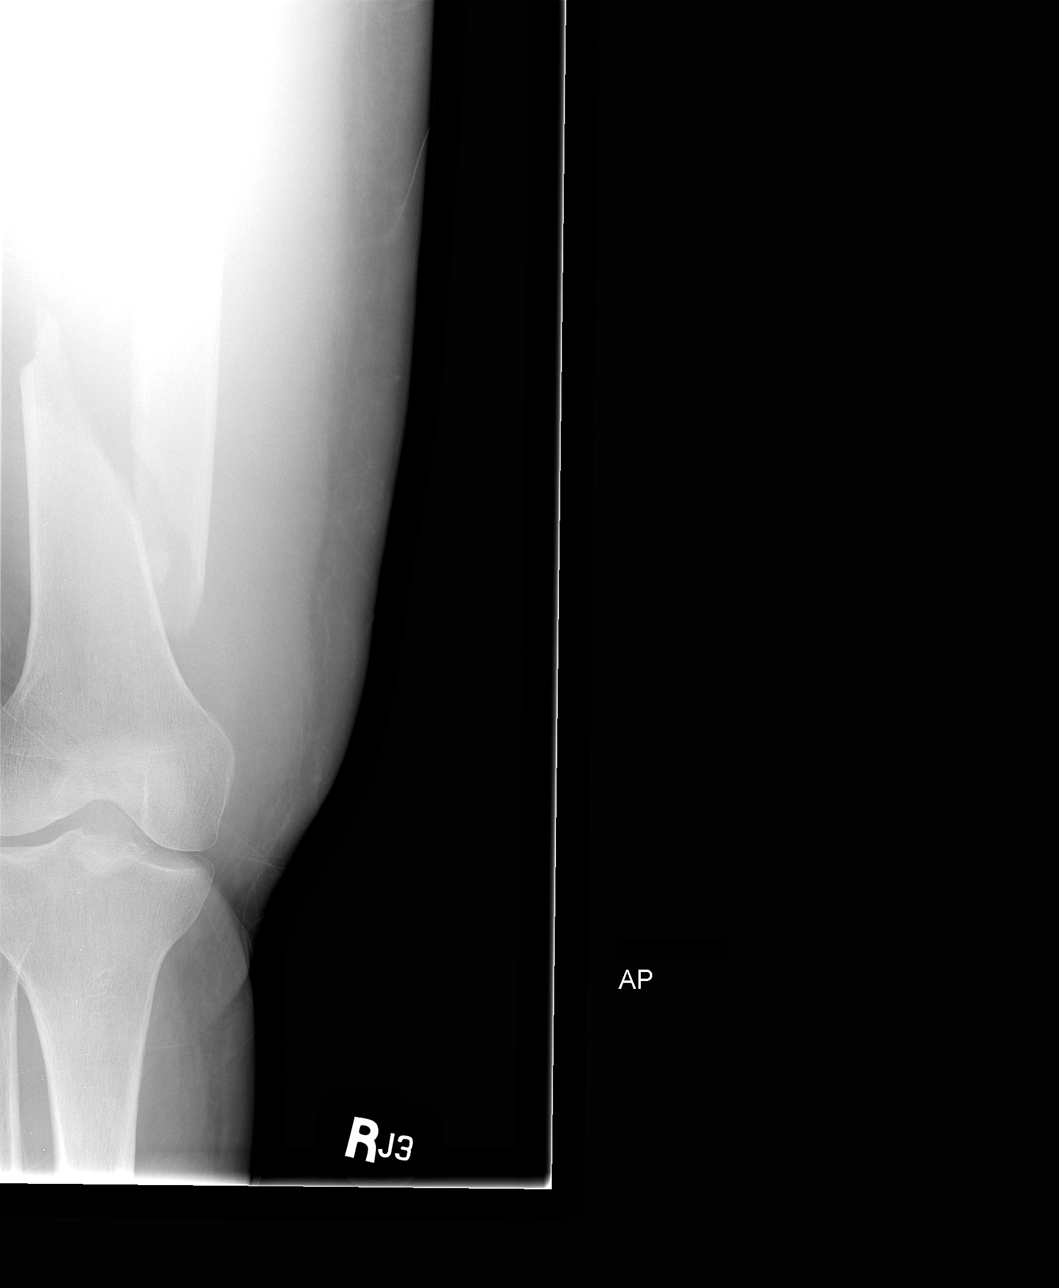

[view not recorded (3 of 4)]
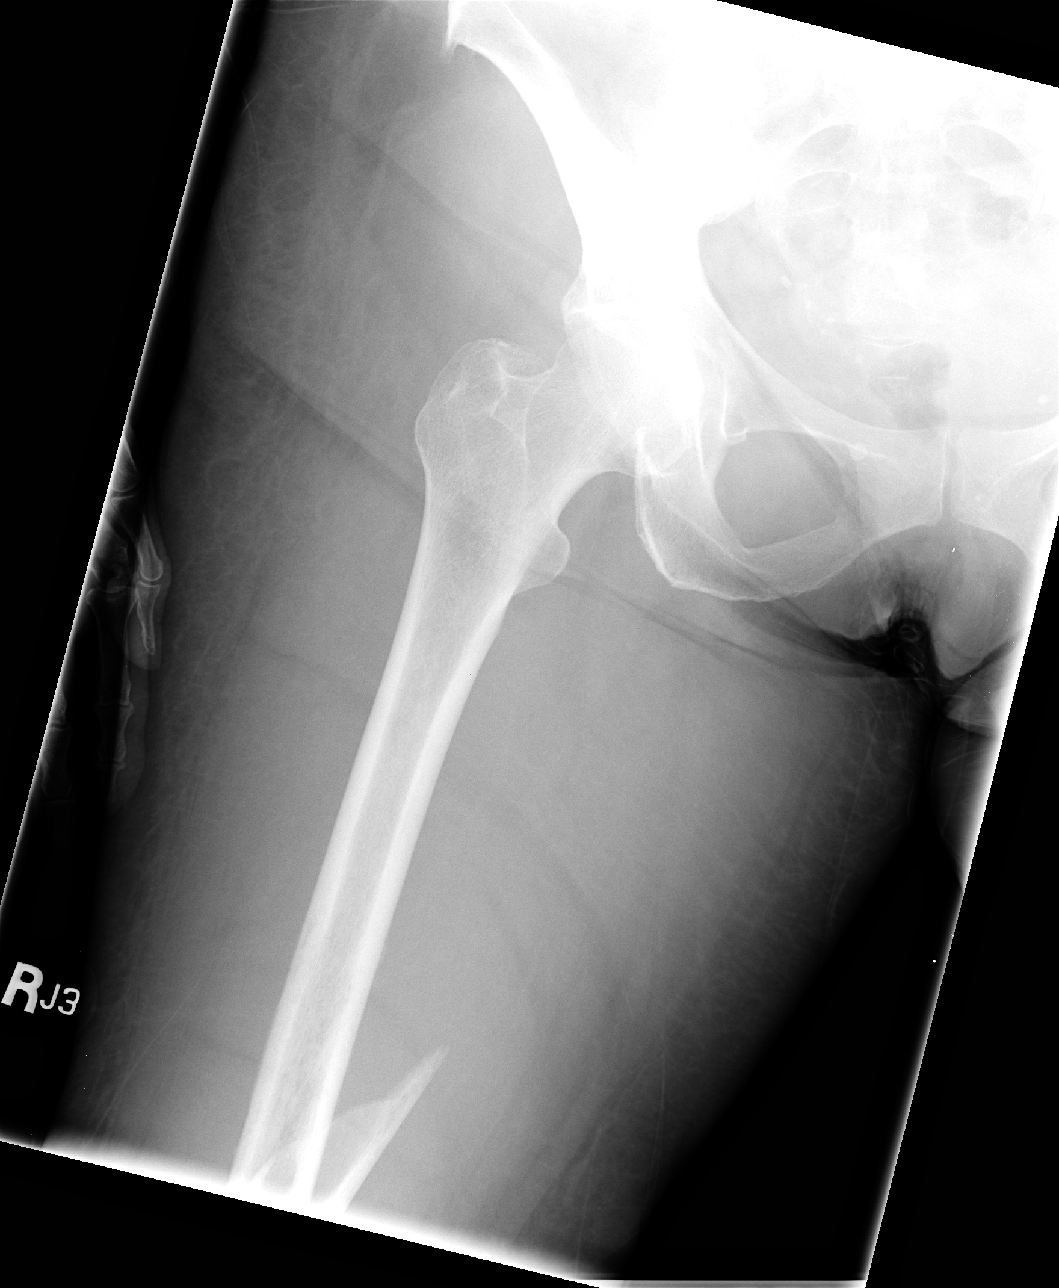

[view not recorded (4 of 4)]
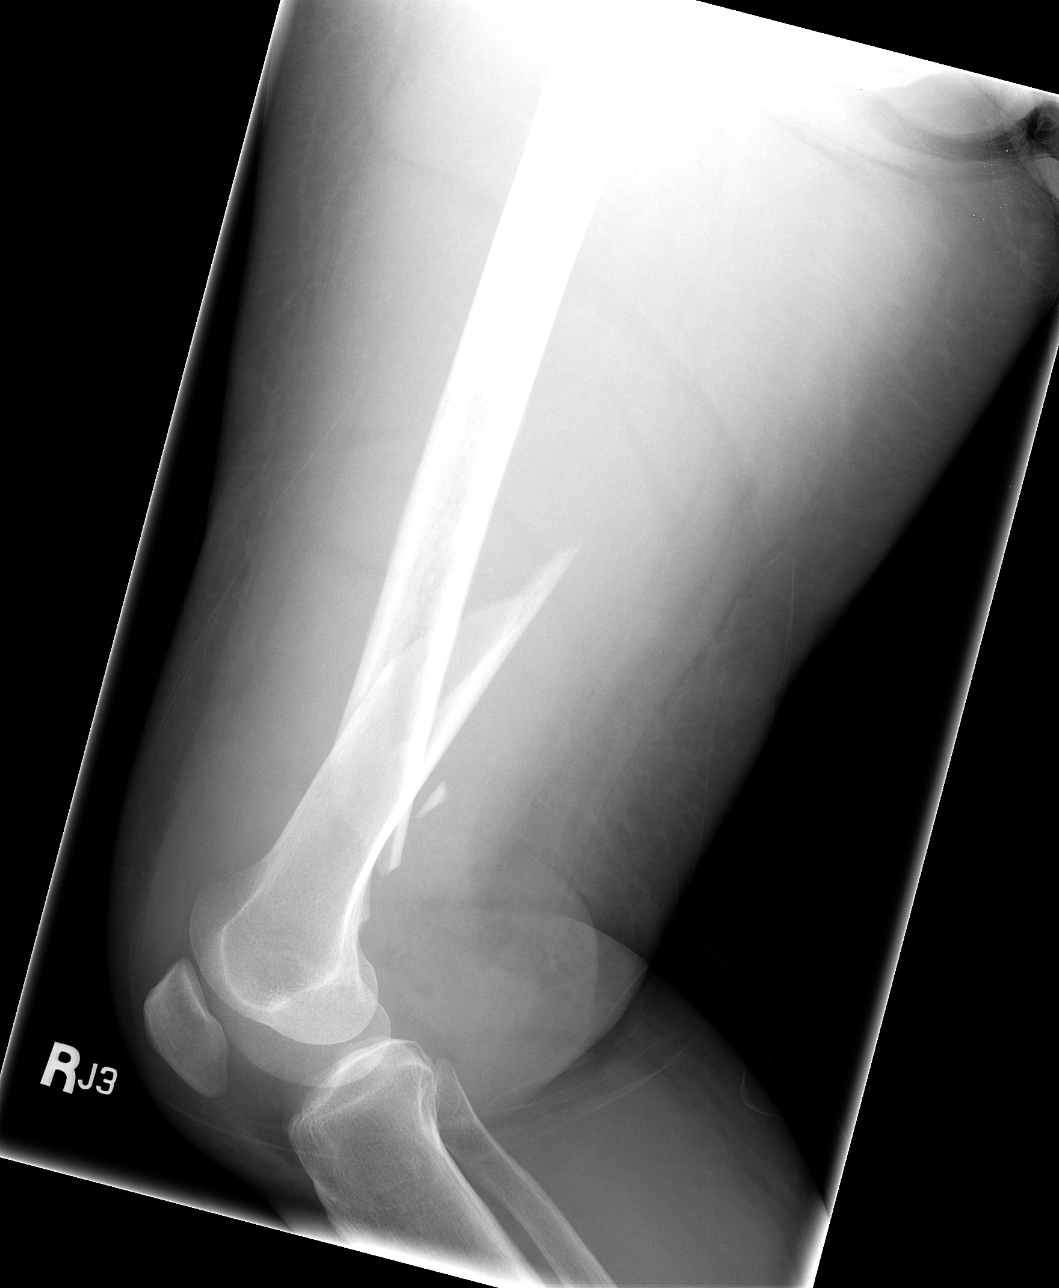

[4 of 4 positions shown; findings below may reference images not displayed]

FINDINGS: Oblique distal right femoral diaphyseal fracture with apex
posterior and lateral angulation.
Lateral and posterior displacement noted as well.
Hip and knee joint alignments appear grossly preserved.
No additional fracture or dislocation identified.
Visualized portion of right hemi pelvis appears intact.
Osseous demineralization.
IMPRESSION: Displaced angulated distal right femoral diaphyseal fracture.

Patient transported to [HOSPITAL] conclusion of exam.

## 2013-11-28 ENCOUNTER — Encounter (HOSPITAL_COMMUNITY): Payer: Self-pay | Admitting: *Deleted

## 2014-01-04 ENCOUNTER — Encounter (HOSPITAL_COMMUNITY): Payer: Self-pay | Admitting: Interventional Cardiology

## 2015-09-11 NOTE — Telephone Encounter (Signed)
xxx
